# Patient Record
Sex: Male | Born: 1945 | Race: White | Hispanic: No | Smoking: Current every day smoker
Health system: Southern US, Community
[De-identification: ages and names within clinical notes are randomized; demographics above are authoritative.]

## PROBLEM LIST (undated history)

## (undated) DIAGNOSIS — I739 Peripheral vascular disease, unspecified: Secondary | ICD-10-CM

## (undated) DIAGNOSIS — C679 Malignant neoplasm of bladder, unspecified: Secondary | ICD-10-CM

## (undated) DIAGNOSIS — I10 Essential (primary) hypertension: Secondary | ICD-10-CM

## (undated) DIAGNOSIS — C3490 Malignant neoplasm of unspecified part of unspecified bronchus or lung: Secondary | ICD-10-CM

## (undated) HISTORY — DX: Malignant neoplasm of bladder, unspecified: C67.9

## (undated) HISTORY — DX: Essential (primary) hypertension: I10

## (undated) HISTORY — DX: Malignant neoplasm of unspecified part of unspecified bronchus or lung: C34.90

---

## 2001-11-01 HISTORY — PX: PACEMAKER INSERTION: SHX728

## 2007-11-02 HISTORY — PX: IR ABDOMINAL AORTOBIFEMORAL CATHETER SERIALOGRAM: IMG637

## 2007-11-02 HISTORY — PX: OTHER SURGICAL HISTORY: SHX169

## 2011-11-02 HISTORY — PX: COLONOSCOPY: SHX174

## 2014-11-01 DIAGNOSIS — C3490 Malignant neoplasm of unspecified part of unspecified bronchus or lung: Secondary | ICD-10-CM

## 2014-11-01 HISTORY — DX: Malignant neoplasm of unspecified part of unspecified bronchus or lung: C34.90

## 2015-09-02 HISTORY — PX: LOBECTOMY: SHX5089

## 2017-08-12 ENCOUNTER — Other Ambulatory Visit: Payer: Self-pay | Admitting: Hematology and Oncology

## 2017-08-12 HISTORY — PX: CYSTOSCOPY: SUR368

## 2017-08-12 HISTORY — PX: BLADDER SURGERY: SHX569

## 2017-09-07 ENCOUNTER — Encounter: Payer: Self-pay | Admitting: Hematology and Oncology

## 2017-09-07 ENCOUNTER — Inpatient Hospital Stay: Payer: Non-veteran care | Attending: Hematology and Oncology | Admitting: Hematology and Oncology

## 2017-09-07 ENCOUNTER — Inpatient Hospital Stay: Payer: Non-veteran care

## 2017-09-07 VITALS — BP 107/74 | HR 103 | Temp 97.1°F | Resp 18 | Ht 71.0 in | Wt 142.9 lb

## 2017-09-07 DIAGNOSIS — I10 Essential (primary) hypertension: Secondary | ICD-10-CM | POA: Insufficient documentation

## 2017-09-07 DIAGNOSIS — R05 Cough: Secondary | ICD-10-CM | POA: Diagnosis not present

## 2017-09-07 DIAGNOSIS — I739 Peripheral vascular disease, unspecified: Secondary | ICD-10-CM | POA: Insufficient documentation

## 2017-09-07 DIAGNOSIS — I959 Hypotension, unspecified: Secondary | ICD-10-CM | POA: Diagnosis not present

## 2017-09-07 DIAGNOSIS — R509 Fever, unspecified: Secondary | ICD-10-CM | POA: Diagnosis not present

## 2017-09-07 DIAGNOSIS — C679 Malignant neoplasm of bladder, unspecified: Secondary | ICD-10-CM | POA: Diagnosis not present

## 2017-09-07 DIAGNOSIS — C672 Malignant neoplasm of lateral wall of bladder: Secondary | ICD-10-CM

## 2017-09-07 DIAGNOSIS — Z79899 Other long term (current) drug therapy: Secondary | ICD-10-CM | POA: Diagnosis not present

## 2017-09-07 DIAGNOSIS — C7951 Secondary malignant neoplasm of bone: Secondary | ICD-10-CM

## 2017-09-07 DIAGNOSIS — F17210 Nicotine dependence, cigarettes, uncomplicated: Secondary | ICD-10-CM | POA: Insufficient documentation

## 2017-09-07 DIAGNOSIS — I471 Supraventricular tachycardia: Secondary | ICD-10-CM

## 2017-09-07 DIAGNOSIS — R300 Dysuria: Secondary | ICD-10-CM | POA: Insufficient documentation

## 2017-09-07 DIAGNOSIS — M542 Cervicalgia: Secondary | ICD-10-CM | POA: Diagnosis not present

## 2017-09-07 DIAGNOSIS — R001 Bradycardia, unspecified: Secondary | ICD-10-CM

## 2017-09-07 DIAGNOSIS — Z7982 Long term (current) use of aspirin: Secondary | ICD-10-CM | POA: Insufficient documentation

## 2017-09-07 DIAGNOSIS — K5903 Drug induced constipation: Secondary | ICD-10-CM | POA: Insufficient documentation

## 2017-09-07 DIAGNOSIS — R0602 Shortness of breath: Secondary | ICD-10-CM | POA: Diagnosis not present

## 2017-09-07 DIAGNOSIS — Z85118 Personal history of other malignant neoplasm of bronchus and lung: Secondary | ICD-10-CM | POA: Insufficient documentation

## 2017-09-07 DIAGNOSIS — C3411 Malignant neoplasm of upper lobe, right bronchus or lung: Secondary | ICD-10-CM | POA: Insufficient documentation

## 2017-09-07 DIAGNOSIS — T451X5A Adverse effect of antineoplastic and immunosuppressive drugs, initial encounter: Secondary | ICD-10-CM

## 2017-09-07 DIAGNOSIS — R634 Abnormal weight loss: Secondary | ICD-10-CM | POA: Diagnosis not present

## 2017-09-07 DIAGNOSIS — Z7902 Long term (current) use of antithrombotics/antiplatelets: Secondary | ICD-10-CM | POA: Insufficient documentation

## 2017-09-07 DIAGNOSIS — R61 Generalized hyperhidrosis: Secondary | ICD-10-CM | POA: Diagnosis not present

## 2017-09-07 DIAGNOSIS — Z902 Acquired absence of lung [part of]: Secondary | ICD-10-CM

## 2017-09-07 DIAGNOSIS — Z809 Family history of malignant neoplasm, unspecified: Secondary | ICD-10-CM | POA: Insufficient documentation

## 2017-09-07 LAB — URINALYSIS, COMPLETE (UACMP) WITH MICROSCOPIC
Bilirubin Urine: NEGATIVE
GLUCOSE, UA: NEGATIVE mg/dL
Ketones, ur: NEGATIVE mg/dL
NITRITE: NEGATIVE
PH: 5.5 (ref 5.0–8.0)
Protein, ur: 30 mg/dL — AB
SPECIFIC GRAVITY, URINE: 1.025 (ref 1.005–1.030)
Squamous Epithelial / LPF: NONE SEEN

## 2017-09-07 LAB — COMPREHENSIVE METABOLIC PANEL
ALK PHOS: 181 U/L — AB (ref 38–126)
ALT: 17 U/L (ref 17–63)
AST: 22 U/L (ref 15–41)
Albumin: 3.3 g/dL — ABNORMAL LOW (ref 3.5–5.0)
Anion gap: 11 (ref 5–15)
BUN: 9 mg/dL (ref 6–20)
CALCIUM: 8.8 mg/dL — AB (ref 8.9–10.3)
CO2: 23 mmol/L (ref 22–32)
CREATININE: 0.84 mg/dL (ref 0.61–1.24)
Chloride: 97 mmol/L — ABNORMAL LOW (ref 101–111)
Glucose, Bld: 107 mg/dL — ABNORMAL HIGH (ref 65–99)
Potassium: 3.6 mmol/L (ref 3.5–5.1)
Sodium: 131 mmol/L — ABNORMAL LOW (ref 135–145)
Total Bilirubin: 0.3 mg/dL (ref 0.3–1.2)
Total Protein: 7.5 g/dL (ref 6.5–8.1)

## 2017-09-07 LAB — PROTIME-INR
INR: 0.96
PROTHROMBIN TIME: 12.7 s (ref 11.4–15.2)

## 2017-09-07 LAB — CBC WITH DIFFERENTIAL/PLATELET
BASOS PCT: 2 %
Basophils Absolute: 0.2 10*3/uL — ABNORMAL HIGH (ref 0–0.1)
EOS ABS: 0.3 10*3/uL (ref 0–0.7)
EOS PCT: 2 %
HCT: 44.9 % (ref 40.0–52.0)
HEMOGLOBIN: 15.5 g/dL (ref 13.0–18.0)
Lymphocytes Relative: 24 %
Lymphs Abs: 3.2 10*3/uL (ref 1.0–3.6)
MCH: 31 pg (ref 26.0–34.0)
MCHC: 34.5 g/dL (ref 32.0–36.0)
MCV: 89.8 fL (ref 80.0–100.0)
MONOS PCT: 7 %
Monocytes Absolute: 1 10*3/uL (ref 0.2–1.0)
NEUTROS PCT: 65 %
Neutro Abs: 8.5 10*3/uL — ABNORMAL HIGH (ref 1.4–6.5)
PLATELETS: 382 10*3/uL (ref 150–440)
RBC: 5.01 MIL/uL (ref 4.40–5.90)
RDW: 14.2 % (ref 11.5–14.5)
WBC: 13.1 10*3/uL — AB (ref 3.8–10.6)

## 2017-09-07 LAB — APTT: aPTT: 34 seconds (ref 24–36)

## 2017-09-07 NOTE — Progress Notes (Signed)
Patient here today as new evaluation regarding Stage IV bladder cancer.  Referred by [facility] in [place].  Patient accompanied by his girlfriend today.

## 2017-09-07 NOTE — Progress Notes (Signed)
[facility] day:  09/07/2017  Chief Complaint: Patrick Hooper is a 71 y.o. male with stage IV bladder cancer who is referred in consultation by Dr Payton Spark for assessment and management.  HPI:  The patient has a history of stage IB lung cancer status post right upper lobe lobectomy. Patient was diagnosed in 2016 following low dose chest CT screening. He subsequently had a PET scan that demonstrated lung cancer. Lobectomy was done in 09/2015. Pathology revealed a T2aN0M0 adenocarcinoma.  Chest CT on 05/25/2017 revealed postoperative changes from a right upper lobe lobectomy without evidence of recurrence. There was new diffuse punctate sclerotic osseous lesions throughout the chest concerning for metastatic disease.  PET scan on 06/08/2017 revealed right upper lobe lobectomy without evidence of recurrence. There was a new 3.2 cm mass along the right inferior bladder wall, anterior to the ureteral vesicular junction.  There were multiple new focal sclerotic and some probable mixed lytic and sclerotic osseous lesions in the axial skeleton many of which demonstrated increased FDG avidity. Differential diagnoses included include metastatic lung cancer, bladder cancer or prostate cancer. There was increased FDG uptake in the left medial thigh musculature of uncertain etiology. MRI of the left thigh with and without IV contrast was recommended.  He underwent transurethral resection of bladder tumor (TURBT) with bilateral retrograde pyelogram on 08/12/2017 by Dr. Rutherford Limerick.  Delay was secondary to patient being out of town for a 3 week period. He had a 3 cm bladder tumor on the right lateral wall with some sessile and papillary features.  Retrograde pyelograms were normal without filling defect.  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  He presented with symptomatic bone pain (ribs, shoulders, back,  neck, and right elbow).  Pain woke him up at night.  At that time the patient was ambivalent about treatment was more interested in symptom management.   He was interested in palliative radiation. Because of under staffing at the [facility], referral or treatment was made to an outside facility.  He has a history of peripheral vascular disease. He is status post fem-fem bypass in 2016. He is on Plavix.  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  Colonoscopy was normal in 2011.  Symptomatically, he is experiencing urinary difficulties. He has urinary dribbling and burning. Patient is unsure of his prostate health. He notes that he cannot recall the diagnosis of prostatic hyperplasia (BPH) in the past that have produced LUTS. Patient states, "I know they have checked my PSA and they told me that it was low".   Patient was last seen by the urologist at the [facility] on 09/01/2017. Patient has good days and bad days. 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 One day I feel fine, and the next day I feel like hell."  Patient notes fevers (low grade), drenching sweats, and weight loss over the course of the last few weeks. Patient's baseline weight is in the 150s; today he is at 142 pounds. Patient has chronic cough and shortness of breath s/p lung resection. Cough is non-productive. Patient continues to smoke on a daily basis. He has opioid induced constipation. Patient has polyarthralgias.   Patient's activity level is severely affected. He notes that he sits most of the day. He retains the ability to independently perform all of his activities of daily living.  Patient has daily claudication pain. He has  has several vascular procedures to his lower extremities. Patient has stopped his ASA and anticoagulation therapy. Patient states, "I take too much damn medicine, so I stopped it".    Past Medical History:  Diagnosis Date  . Hypertension     Past Surgical  History:  Procedure Laterality Date  . BLADDER SURGERY  08/12/2017  . LOBECTOMY Right 09/2015  . PACEMAKER INSERTION  2003    Family History  Problem Relation Age of Onset  . Cancer Mother   . Cancer Sister   . Cancer Maternal Grandmother     Social History:  reports that he has been smoking cigarettes.  He has a 55.00 pack-year smoking history. he has never used smokeless tobacco. He reports that he drinks alcohol. He reports that he does not use drugs.  He smokes 1 pack a day for > 55 years.  He used to drink 4 alcoholic beverages/day.  Now his alcohol intake is infrequent. 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 There is the potential of past exposure to radiation and/or toxins. He is registered with the [place] regarding his exposures.  He lives in [place].  The patient is accompanied by his girlfriend Optometrist) today.  Allergies:  Allergies  Allergen Reactions  . Cefazolin Rash and Shortness Of Breath  . Carvedilol     Other reaction(s): Unknown  . Chlorthalidone     Other reaction(s): Unknown  . Hydralazine     Other reaction(s): Unknown    Current Medications: Current Outpatient Medications  Medication Sig Dispense Refill  . amLODipine (NORVASC) 10 MG tablet Take 10 mg daily by mouth.    . docusate sodium (COLACE) 100 MG capsule Take 100 mg 2 (two) times daily by mouth.    . mirtazapine (REMERON) 15 MG tablet Take 15 mg at bedtime by mouth.    . morphine (MSIR) 15 MG tablet Take 15 mg every 4 (four) hours as needed by mouth for moderate pain or severe pain.    Marland Kitchen oxycodone (OXY-IR) 5 MG capsule Take 5 mg every 4 (four) hours as needed by mouth for pain.    . traMADol (ULTRAM) 50 MG tablet Take every 6 (six) hours as needed by mouth for moderate pain.    Marland Kitchen aspirin 81 MG chewable tablet Chew 81 mg daily by mouth.    . clopidogrel (PLAVIX) 75 MG tablet Take 75 mg daily by mouth.    Marland Kitchen lisinopril (PRINIVIL,ZESTRIL) 20 MG tablet  Take 20 mg daily by mouth.     No current facility-administered medications for this visit.     Review of Systems:  GENERAL:  Feels "bad".  Low grade fever x 1-2 weeks.  Sweats at night.  Weight loss of 10-15 pounds in the last 1-2 months. PERFORMANCE STATUS (ECOG):  2 HEENT:  Runny nose.  No visual changes, sore throat, mouth sores or tenderness. Lungs: Shortness of breath with exertion.  Cough.  No hemoptysis. Cardiac:  Implanted pacemaker (NOT MRI COMPATIBLE). No chest pain, palpitations, orthopnea, or PND.  Blood pressure fluctuates. GI:  Appetite decreased.  Constipation secondary to narcotics.  No nausea, vomiting, diarrhea, melena or hematochezia. GU:  Dribbling.  Urgency, frequency, dysuria and nocturia.  No hematuria. Musculoskeletal:  Bone pain (lower ribs, shoulders, back, neck, shoulder).  Joint pain (left wrist and right elbow). No muscle tenderness. Extremities:  No pain or swelling. Skin:  No rashes or skin changes. Neuro:  Minor headache.  No numbness or weakness,  balance or coordination issues. Endocrine:  No diabetes, thyroid issues, hot flashes or night sweats. Psych:  No mood changes, depression or anxiety. Pain:  Bone pain. Review of systems:  All other systems reviewed and found to be negative.  Physical Exam: Blood pressure 107/74, pulse (!) 103, temperature (!) 97.1 F (36.2 C), temperature source Tympanic, resp. rate 18, height 5' 11"  (1.803 m), weight 142 lb 13.7 oz (64.8 kg). GENERAL:  Well developed, well nourished, gentleman sitting comfortably in the exam room in no acute distress. MENTAL STATUS:  Alert and oriented to person, place and time. HEAD:  Thin gray hair.  Normocephalic, atraumatic, face symmetric, no Cushingoid features. EYES:  Blue eyes.  Pupils equal round and reactive to light and accomodation.  No conjunctivitis or scleral icterus. ENT:  Oropharynx clear without lesion.  Tongue normal.  Upper dentures.  No lower teeth.  Mucous membranes moist.   NECK:  Well healed scar right sided scar. CHEST:  Left sided pacemaker. RESPIRATORY:  Clear to auscultation without rales, wheezes or rhonchi. CARDIOVASCULAR:  Regular rate and rhythm without murmur, rub or gallop. ABDOMEN:  Soft, non-tender, with active bowel sounds, and no hepatosplenomegaly.  No masses. BACK:  Focal areas of pain in lower thoracic spine and L2-L3. SKIN:  No rashes, ulcers or lesions. EXTREMITIES: Nicotine stained nails.  No edema, no skin discoloration or tenderness.  No palpable cords. LYMPH NODES: No palpable cervical, supraclavicular, axillary or inguinal adenopathy  NEUROLOGICAL: Unremarkable. PSYCH:  Appropriate.   No visits with results within 3 Day(s) from this visit.  Latest known visit with results is:  No results found for any previous visit.    Assessment:  Hermon Zea is a 71 y.o. male with metastatic disease.  He has a recent history of muscle invasive bladder cancer and a distant history of lung cancer.  He has a history of stage IB lung cancer status post right upper lobe lobectomy in 09/2015.  Pathology revealed a T2aN0M0 adenocarcinoma.  Chest CT on 05/25/2017 revealed postoperative changes from a right upper lobe lobectomy without evidence of recurrence. There was new diffuse punctate sclerotic osseous lesions throughout the chest concerning for metastatic disease.  PET scan on 06/08/2017 at the [facility] revealed right upper lobe lobectomy without evidence of recurrence. There was a new 3.2 cm mass along the right inferior bladder wall, anterior to the ureteral vesicular junction.  There were multiple new focal sclerotic and some probable mixed lytic and sclerotic osseous lesions in the axial skeleton many of which demonstrated increased FDG avidity. Differential diagnoses included include metastatic lung cancer, bladder cancer or prostate cancer. There was increased FDG uptake in the left medial thigh musculature of uncertain etiology.  He  underwent transurethral resection of bladder tumor (TURBT) with bilateral retrograde pyelogram on 08/12/2017.  He had a 3 cm bladder tumor on the right lateral wall with some sessile and papillary features.  Retrograde pyelograms were normal without filling defect.  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  He has a history of peripheral vascular disease s/p fem-fem bypass in 2016. He is on Plavix.  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  Colonoscopy was normal in 2011.  Symptomatically, he is fatigued.  He has urinary difficulties. He has low grade fevers, drenching sweats, and weight loss.  He has a chronic cough and shortness of breath s/p lung resection. He continues to smoke on a daily basis. He has opioid induced  constipation.  He has claudication.  Exam reveals tenderness in the lower thoracic spine and L2-L3 area.  Plan: 1.  Discuss metastatic disease.  Patient has a distant history of stage I lung cancer and newly diagnosed muscle invasive bladder cancer.  Discuss obtaining tissue for confirmation of metastatic primary. 2.  Labs today:  CBC with diff, CMP, CEA, PSA, UA, urine culture. 3.  Obtain copy of PET scan performed at [facility]. 4.  Discuss need for repeat imaging to establish new baseline. Schedule repeat PET scan. Will order STAT exam for 09/09/2017. 5.  Discuss treatment for bladder cancer. Discuss platinum based chemotherapy (carboplatin versus cisplatin + gemcitabine).  Patient is not a candidate for bladder resection secondary to metastatic disease.  6.  Discuss plan for PDL-1 testing. Biopsy was done at the [place]. Discuss possibility of immunotherapy based on the results of the testing.  7.  Samara Deist. 8.  Refer to urology to establish local care for urothelial carcinoma.  9.  RTC after PET scan for MD assessment, review of PET imaging, discussion regarding direction of therapy.   Honor Loh, NP   09/07/2017, 3:03 PM   I saw and evaluated the patient, participating in the key portions of the service and reviewing pertinent diagnostic studies and records.  I reviewed the nurse practitioner's note and agree with the findings and the plan.  The assessment and plan were discussed with the patient.  Additional diagnostic studies of a PET scan are needed to clarify the status of his metastatic disease and would change the clinical management. Multiple questions were asked by the patient and answered.   Nolon Stalls, MD 09/07/2017, 3:03 PM

## 2017-09-08 LAB — CEA: CEA: 20.2 ng/mL — ABNORMAL HIGH (ref 0.0–4.7)

## 2017-09-08 LAB — PSA: Prostatic Specific Antigen: 7.95 ng/mL — ABNORMAL HIGH (ref 0.00–4.00)

## 2017-09-09 ENCOUNTER — Ambulatory Visit (INDEPENDENT_AMBULATORY_CARE_PROVIDER_SITE_OTHER): Payer: Medicare Other | Admitting: Urology

## 2017-09-09 ENCOUNTER — Other Ambulatory Visit: Payer: Self-pay | Admitting: Hematology and Oncology

## 2017-09-09 ENCOUNTER — Encounter
Admission: RE | Admit: 2017-09-09 | Discharge: 2017-09-09 | Disposition: A | Discharge status: Home / Self Care | Payer: Non-veteran care | Source: Ambulatory Visit | Attending: Urgent Care | Admitting: Urgent Care

## 2017-09-09 ENCOUNTER — Encounter: Payer: Self-pay | Admitting: Urology

## 2017-09-09 VITALS — BP 109/79 | HR 116 | Ht 71.0 in | Wt 142.0 lb

## 2017-09-09 DIAGNOSIS — C672 Malignant neoplasm of lateral wall of bladder: Secondary | ICD-10-CM | POA: Diagnosis present

## 2017-09-09 DIAGNOSIS — C3411 Malignant neoplasm of upper lobe, right bronchus or lung: Secondary | ICD-10-CM | POA: Insufficient documentation

## 2017-09-09 DIAGNOSIS — C7951 Secondary malignant neoplasm of bone: Secondary | ICD-10-CM | POA: Diagnosis present

## 2017-09-09 DIAGNOSIS — C679 Malignant neoplasm of bladder, unspecified: Secondary | ICD-10-CM | POA: Diagnosis not present

## 2017-09-09 LAB — URINALYSIS, COMPLETE
BILIRUBIN UA: NEGATIVE
Glucose, UA: NEGATIVE
KETONES UA: NEGATIVE
Nitrite, UA: NEGATIVE
PH UA: 7 (ref 5.0–7.5)
SPEC GRAV UA: 1.015 (ref 1.005–1.030)
Urobilinogen, Ur: 1 mg/dL (ref 0.2–1.0)

## 2017-09-09 LAB — URINE CULTURE: Culture: NO GROWTH

## 2017-09-09 LAB — MICROSCOPIC EXAMINATION
BACTERIA UA: NONE SEEN
Epithelial Cells (non renal): NONE SEEN /hpf (ref 0–10)

## 2017-09-09 LAB — GLUCOSE, CAPILLARY: GLUCOSE-CAPILLARY: 112 mg/dL — AB (ref 65–99)

## 2017-09-09 MED ORDER — FLUDEOXYGLUCOSE F - 18 (FDG) INJECTION
12.0100 | Freq: Once | INTRAVENOUS | Status: AC | PRN
  Administered 2017-09-09: 12.01 via INTRAVENOUS

## 2017-09-09 NOTE — Progress Notes (Signed)
09/09/2017 2:53 PM   [place] 12-06-1945 [id number]  Referring provider: Nolon Stalls, M.D.  Chief Complaint  Patient presents with  . New Patient (Initial Visit)    HPI: Patrick Hooper is a 71 year old male referred for evaluation of recent urothelial carcinoma of the bladder.  He has a history of stage Ib lung cancer status post right upper lobe lobectomy for pathologic T2aN0M0 adenocarcinoma.  A chest CT performed in July 2018 revealed new diffuse punctate sclerotic osseous lesions throughout the chest concerning for metastatic disease. PET scan performed in August 2018 revealed a 3 cm mass along the right inferior bladder wall and multiple new focal sclerotic/lytic lesions in the axial skeleton many which demonstrated increased FDG avidity.  TURBT was performed at the [place] on 08/12/2017 with findings of a sessile and papillary right lateral wall tumor.  Pathology revealed high-grade urothelial carcinoma with muscle invasion.  Retrograde pyelograms apparently showed no abnormalities.  [facility] my [facility].  Urinalysis in the [facility] on 11/7 showed pyuria however the urine culture was negative.  He does complain of malaise and back pain.  Repeat PET performed today showed several hypermetabolic mixed lytic/sclerotic osseous metastasis throughout the axial skeleton.  There is suspected intramuscular soft tissue metastasis in the right quadratus lumborum and posterior left lumbar paraspinal musculature.  Mild hypermetabolic retroperitoneal adenopathy was noted.  Diffuse irregular bladder wall thickening prominent on the right was also noted.   PMH: Past Medical History:  Diagnosis Date  . Hypertension   . Lung cancer [facility]) 2016    Surgical History: Past Surgical History:  Procedure Laterality Date  . BLADDER SURGERY  08/12/2017  . BYPASS GRAFT FEMORAL/POPLITEAL W/VEIN  2009  . COLONOSCOPY  2013  . CYSTOSCOPY  08/12/2017  . IR ABDOMINAL  AORTOBIFEMORAL CATHETER SERIALOGRAM  2009  . LOBECTOMY Right 09/2015  . PACEMAKER INSERTION  2003    Home Medications:  Allergies as of 09/09/2017      Reactions   Cefazolin Rash, Shortness Of Breath   Carvedilol    Other reaction(s): Unknown   Chlorthalidone    Other reaction(s): Unknown   Hydralazine    Other reaction(s): Unknown      Medication List        Accurate as of 09/09/17  2:53 PM. Always use your most recent med list.          amLODipine 10 MG tablet Commonly known as:  NORVASC Take 10 mg daily by mouth.   aspirin 81 MG chewable tablet Chew 81 mg daily by mouth.   clopidogrel 75 MG tablet Commonly known as:  PLAVIX Take 75 mg daily by mouth.   docusate sodium 100 MG capsule Commonly known as:  COLACE Take 100 mg 2 (two) times daily by mouth.   lisinopril 20 MG tablet Commonly known as:  PRINIVIL,ZESTRIL Take 20 mg daily by mouth.   mirtazapine 15 MG tablet Commonly known as:  REMERON Take 15 mg at bedtime by mouth.   morphine 15 MG tablet Commonly known as:  MSIR Take 15 mg every 4 (four) hours as needed by mouth for moderate pain or severe pain.   oxycodone 5 MG capsule Commonly known as:  OXY-IR Take 5 mg every 4 (four) hours as needed by mouth for pain.       Allergies:  Allergies  Allergen Reactions  . Cefazolin Rash and Shortness Of Breath  . Carvedilol     Other reaction(s): Unknown  . Chlorthalidone  Other reaction(s): Unknown  . Hydralazine     Other reaction(s): Unknown    Family History: Family History  Problem Relation Age of Onset  . Cancer Mother   . Cancer Sister   . Cancer Maternal Grandmother     Social History:  reports that he has been smoking cigarettes.  He has a 55.00 pack-year smoking history. he has never used smokeless tobacco. He reports that he drinks alcohol. He reports that he does not use drugs.  ROS: UROLOGY Frequent Urination?: Yes Hard to postpone urination?: Yes Burning/pain with  urination?: Yes Get up at night to urinate?: Yes Leakage of urine?: No Urine stream starts and stops?: Yes Trouble starting stream?: No Do you have to strain to urinate?: No Blood in urine?: No Urinary tract infection?: No Sexually transmitted disease?: No Injury to kidneys or bladder?: No Painful intercourse?: No Weak stream?: No Erection problems?: Yes Penile pain?: No  Gastrointestinal Nausea?: No Vomiting?: No Indigestion/heartburn?: No Diarrhea?: No Constipation?: No  Constitutional Fever: Yes Night sweats?: Yes Weight loss?: Yes Fatigue?: Yes  Skin Skin rash/lesions?: No Itching?: No  Eyes Blurred vision?: No Double vision?: No  Ears/Nose/Throat Sore throat?: Yes Sinus problems?: No  Hematologic/Lymphatic Swollen glands?: No Easy bruising?: No  Cardiovascular Leg swelling?: No Chest pain?: No  Respiratory Cough?: Yes Shortness of breath?: Yes  Endocrine Excessive thirst?: No  Musculoskeletal Back pain?: Yes Joint pain?: No  Neurological Headaches?: Yes Dizziness?: Yes  Psychologic Depression?: No Anxiety?: No  Physical Exam: BP 109/79   Pulse (!) 116   Ht 5\' 11"  (1.803 m)   Wt 142 lb (64.4 kg)   BMI 19.80 kg/m   Constitutional:  Alert and oriented, No acute distress. HEENT:  AT, moist mucus membranes.  Trachea midline, no masses. Cardiovascular: No clubbing, cyanosis, or edema. Respiratory: Normal respiratory effort, no increased work of breathing. GI: Abdomen is soft, nontender, nondistended, no abdominal masses GU: No CVA tenderness.  Skin: No rashes, bruises or suspicious lesions. Lymph: No cervical or inguinal adenopathy. Neurologic: Grossly intact, no focal deficits, moving all 4 extremities. Psychiatric: Normal mood and affect.  Laboratory Data: Lab Results  Component Value Date   WBC 13.1 (H) 09/07/2017   HGB 15.5 09/07/2017   HCT 44.9 09/07/2017   MCV 89.8 09/07/2017   PLT 382 09/07/2017    Urinalysis Lab  Results  Component Value Date   APPEARANCEUR CLOUDY (A) 09/07/2017   LEUKOCYTESUR SMALL (A) 09/07/2017   PROTEINUR 30 (A) 09/07/2017   GLUCOSEU NEGATIVE 09/07/2017   RBCU 0-5 09/07/2017   BILIRUBINUR NEGATIVE 09/07/2017   NITRITE NEGATIVE 09/07/2017    Lab Results  Component Value Date   BACTERIA RARE (A) 09/07/2017    Pertinent Imaging:  CLINICAL DATA:  Initial treatment strategy for high-grade muscle invasive urothelial carcinoma of the bladder wall, reportedly excised 08/12/2017. Additional reported history of stage IB right upper lobe lung adenocarcinoma in 2016 status post right upper lobectomy. Reported history of lytic osseous lesions.  EXAM: NUCLEAR MEDICINE PET SKULL BASE TO THIGH  TECHNIQUE: 12.0 mCi F-18 FDG was injected intravenously. Full-ring PET imaging was performed from the skull base to thigh after the radiotracer. CT data was obtained and used for attenuation correction and anatomic localization.  FASTING BLOOD GLUCOSE:  Value: 112 mg/dl  COMPARISON:  None.  FINDINGS: NECK: No hypermetabolic lymph nodes in the neck.  CHEST:  No enlarged or hypermetabolic axillary, mediastinal or hilar lymph nodes.  Status post right upper lobectomy. Ground-glass 1.2 cm anterior left upper  lobe nodular opacity with low level metabolism (max SUV 2.1). Otherwise no acute consolidative airspace disease, lung masses or significant pulmonary nodules. No pneumothorax. No pleural effusions.  Two lead left subclavian pacemaker is noted with lead tips in the right atrium and right ventricular apex. Atherosclerotic nonaneurysmal thoracic aorta.  ABDOMEN/PELVIS:  There are a few mildly hypermetabolic retroperitoneal lymph nodes in the retrocaval/ aortocaval chains, for example a 0.8 cm top-normal size right retrocaval node with max SUV 2.9 (series 3/image 150). Otherwise no pathologically enlarged or hypermetabolic abdominopelvic nodes.  No abnormal  hypermetabolic activity within the liver, pancreas, adrenal glands, or spleen. Probable small hiatal hernia. Cholelithiasis. No overt hydronephrosis. Irregular diffuse bladder wall thickening asymmetrically prominent in the right bladder wall. Mild-to-moderate prostatomegaly. Atherosclerotic abdominal aorta. Status post aortobifemoral surgical bypass. Nonspecific mild hypermetabolism throughout the aortobifemoral bypass graft bilaterally.  SKELETON: There are several hypermetabolic mixed lytic and sclerotic osseous lesions throughout the axial skeleton, with representative osseous lesions as follows:  - predominantly lytic destructive right C4 vertebral/pedicle lesion with max SUV 10.6  - T9 vertebral mixed lytic and sclerotic osseous lesion with max SUV 6.9  - T11 spinous process lytic osseous lesion with max SUV 6.7  - medial left posterior eighth rib lytic expansile osseous lesion with max SUV 10.7  - lytic anterior right seventh costochondral junction lesion with max SUV 6.3  There are suspected soft tissue intramuscular metastases in the inferior right quadratus lumborum muscle with max SUV 8.1 and in the posterior left paraspinal muscles at the L2 level with max SUV 7.1.  IMPRESSION: 1. Several hypermetabolic mixed lytic and sclerotic osseous metastases throughout the axial skeleton. 2. Suspected intramuscular soft tissue metastases in the right quadratus lumborum and posterior left lumbar paraspinal musculature. 3. Mildly hypermetabolic retroperitoneal lymphadenopathy, probably representing nodal metastases. 4. Diffuse irregular bladder wall thickening, asymmetrically prominent in the right bladder wall, suspicious for residual/recurrent bladder neoplasm. No overt hydronephrosis. 5. Nonspecific small ground-glass nodular opacity in the left upper lung lobe with low level metabolism, which could be inflammatory or neoplastic. Recommend attention on follow-up  chest CT in 3 months. 6. No evidence of local tumor recurrence in the right lung status post right upper lobectomy . 7. Chronic findings include: Aortic Atherosclerosis (ICD10-I70.0). Cholelithiasis. Mild-to-moderate prostatomegaly.  Assessment & Plan:   1.  High-grade, muscle invasive urothelial carcinoma Will [facility] for [facility].  Widespread metastatic disease noted on PET although unknown if this is related to metastatic urothelial carcinoma or lung cancer.  He has a follow-up in oncology on Monday.  He obviously would not be a cystectomy candidate if his metastasis was related to urothelial carcinoma.  If metastasis are related to lung cancer feel the risks of cystectomy would outweigh the benefits.  Oncology is considering chemotherapy.    Abbie Sons, [place] [address] [place], [place] [address] [phone]

## 2017-09-11 ENCOUNTER — Encounter: Payer: Self-pay | Admitting: Hematology and Oncology

## 2017-09-12 ENCOUNTER — Other Ambulatory Visit: Payer: Self-pay

## 2017-09-12 ENCOUNTER — Encounter: Payer: Self-pay | Admitting: Hematology and Oncology

## 2017-09-12 ENCOUNTER — Inpatient Hospital Stay (HOSPITAL_BASED_OUTPATIENT_CLINIC_OR_DEPARTMENT_OTHER): Payer: Non-veteran care | Admitting: Hematology and Oncology

## 2017-09-12 ENCOUNTER — Other Ambulatory Visit: Payer: Self-pay | Admitting: *Deleted

## 2017-09-12 VITALS — BP 133/89 | HR 121 | Temp 98.0°F | Wt 140.0 lb

## 2017-09-12 DIAGNOSIS — C679 Malignant neoplasm of bladder, unspecified: Secondary | ICD-10-CM

## 2017-09-12 DIAGNOSIS — C672 Malignant neoplasm of lateral wall of bladder: Secondary | ICD-10-CM

## 2017-09-12 DIAGNOSIS — K5903 Drug induced constipation: Secondary | ICD-10-CM

## 2017-09-12 DIAGNOSIS — R634 Abnormal weight loss: Secondary | ICD-10-CM | POA: Diagnosis not present

## 2017-09-12 DIAGNOSIS — C3411 Malignant neoplasm of upper lobe, right bronchus or lung: Secondary | ICD-10-CM

## 2017-09-12 DIAGNOSIS — Z809 Family history of malignant neoplasm, unspecified: Secondary | ICD-10-CM

## 2017-09-12 DIAGNOSIS — Z85118 Personal history of other malignant neoplasm of bronchus and lung: Secondary | ICD-10-CM

## 2017-09-12 DIAGNOSIS — Z7982 Long term (current) use of aspirin: Secondary | ICD-10-CM

## 2017-09-12 DIAGNOSIS — R0602 Shortness of breath: Secondary | ICD-10-CM

## 2017-09-12 DIAGNOSIS — Z79899 Other long term (current) drug therapy: Secondary | ICD-10-CM

## 2017-09-12 DIAGNOSIS — I471 Supraventricular tachycardia: Secondary | ICD-10-CM

## 2017-09-12 DIAGNOSIS — T451X5A Adverse effect of antineoplastic and immunosuppressive drugs, initial encounter: Secondary | ICD-10-CM

## 2017-09-12 DIAGNOSIS — F17210 Nicotine dependence, cigarettes, uncomplicated: Secondary | ICD-10-CM

## 2017-09-12 DIAGNOSIS — R05 Cough: Secondary | ICD-10-CM | POA: Diagnosis not present

## 2017-09-12 DIAGNOSIS — R000 Tachycardia, unspecified: Secondary | ICD-10-CM

## 2017-09-12 DIAGNOSIS — I739 Peripheral vascular disease, unspecified: Secondary | ICD-10-CM | POA: Diagnosis not present

## 2017-09-12 DIAGNOSIS — I10 Essential (primary) hypertension: Secondary | ICD-10-CM

## 2017-09-12 DIAGNOSIS — C7951 Secondary malignant neoplasm of bone: Secondary | ICD-10-CM

## 2017-09-12 DIAGNOSIS — R61 Generalized hyperhidrosis: Secondary | ICD-10-CM

## 2017-09-12 DIAGNOSIS — Z7189 Other specified counseling: Secondary | ICD-10-CM | POA: Insufficient documentation

## 2017-09-12 DIAGNOSIS — R509 Fever, unspecified: Secondary | ICD-10-CM

## 2017-09-12 DIAGNOSIS — Z7902 Long term (current) use of antithrombotics/antiplatelets: Secondary | ICD-10-CM

## 2017-09-12 DIAGNOSIS — Z902 Acquired absence of lung [part of]: Secondary | ICD-10-CM

## 2017-09-12 DIAGNOSIS — R300 Dysuria: Secondary | ICD-10-CM

## 2017-09-12 NOTE — Progress Notes (Signed)
[facility] day:  09/12/2017  Chief Complaint: Patrick Hooper is a 71 y.o. adult with bone metastasis and a distant history of stage IB lung cancer and a recent diagnosis of muscle invasive bladder cancer who is seen for review of interval PET scan.  HPI:  The patient was last seen in the medical oncology clinic on 09/07/2017 for initial consultation.  At that time, he was fatigued.  He had urinary difficulties. He had low grade fevers, drenching sweats, and weight loss.  He had a chronic cough and shortness of breath.  Exam revealed tenderness in the lower thoracic spine and L2-L3 area.  We discussed repeating his PET scan and obtaining a biopsy to confirm the primary site of his metastatic disease given his history of lung cancer and recent muscle invasive bladder cancer.   Labs revealed a normal CBC and CMP except for an alkaline phosphatase of 181.  Calcium was 8.8 (corrected x.x).  CEA was 20.2.  PSA was 7.95.  PET scan on 09/09/2017 revealed several hypermetabolic mixed lytic and sclerotic osseous metastases throughout the axial skeleton.  There was suspected intramuscular soft tissue metastases in the right quadratus lumborum and posterior left lumbar paraspinal musculature.  There was mildly hypermetabolic retroperitoneal lymphadenopathy, probably representing nodal metastases.  There was diffuse irregular bladder wall thickening, asymmetrically prominent in the right bladder wall, suspicious for residual/recurrent bladder neoplasm.   There was nonspecific small ground-glass nodular opacity in the left upper lung lobe with low level metabolism.  There was no evidence of local tumor recurrence in the right lung status post right upper lobectomy.   Patient has been seen in consult by Dr. Bernardo Heater (urology). Patient advises that urologist told him that his urine was clear and that the prostate was inflamed. Otherwise, there was nothing notable within  the prostate.   During the interim, patient notes that he is the same. He continues to experience fatigue, urinary difficulties, sweats, and weight loss. Patient has lost an additional 2 pounds since his last visit. He has persistent cough and shortness of breath. Patient has pain rated 3/10 today. Pain is in his back and neck.   Patient is TACHYcardic to the 120s. Patient stopped taking his blood pressure medications without medical direction. He notes SBPs in the 90s at home.    Past Medical History:  Diagnosis Date  . Bladder cancer ([place])   . Hypertension   . Lung cancer ([place]) 2016    Past Surgical History:  Procedure Laterality Date  . BLADDER SURGERY  08/12/2017  . BYPASS GRAFT FEMORAL/POPLITEAL W/VEIN  2009  . COLONOSCOPY  2013  . CYSTOSCOPY  08/12/2017  . IR ABDOMINAL AORTOBIFEMORAL CATHETER SERIALOGRAM  2009  . LOBECTOMY Right 09/2015  . PACEMAKER INSERTION  2003    Family History  Problem Relation Age of Onset  . Cancer Mother   . Cancer Sister   . Cancer Maternal Grandmother     Social History:  reports that he has been smoking cigarettes.  He has a 55.00 pack-year smoking history. he has never used smokeless tobacco. He reports that he drinks alcohol. He reports that he does not use drugs.  He smokes 1 pack a day for > 55 years.  He used to drink 4 alcoholic beverages/day.  Now his alcohol intake is infrequent. 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 There is the potential of past exposure to radiation and/or  toxins. He is registered with the [place] regarding his exposures.  He lives in [place].  The patient is accompanied by his girlfriend Optometrist) today.  Allergies:  Allergies  Allergen Reactions  . Cefazolin Rash and Shortness Of Breath  . Carvedilol     Other reaction(s): Unknown  . Chlorthalidone     Other reaction(s): Unknown  . Hydralazine     Other reaction(s): Unknown    Current Medications: Current  Outpatient Medications  Medication Sig Dispense Refill  . aspirin 81 MG chewable tablet Chew 81 mg daily by mouth.    . clopidogrel (PLAVIX) 75 MG tablet Take 75 mg daily by mouth.    . docusate sodium (COLACE) 100 MG capsule Take 100 mg 2 (two) times daily by mouth.    Marland Kitchen ibuprofen (ADVIL,MOTRIN) 200 MG tablet Take 400 mg every 6 (six) hours as needed by mouth.    . mirtazapine (REMERON) 15 MG tablet Take 15 mg at bedtime by mouth.    . morphine (MSIR) 15 MG tablet Take 15 mg every 4 (four) hours as needed by mouth for moderate pain or severe pain.    Marland Kitchen oxycodone (OXY-IR) 5 MG capsule Take 5 mg every 4 (four) hours as needed by mouth for pain.    Marland Kitchen amLODipine (NORVASC) 10 MG tablet Take 10 mg daily by mouth.    Marland Kitchen lisinopril (PRINIVIL,ZESTRIL) 20 MG tablet Take 20 mg daily by mouth.     No current facility-administered medications for this visit.     Review of Systems:  GENERAL:  Fatigue.  Low grade fever x 1-2 weeks.  Sweats at night.  Weight loss of 10-15 pounds in the last 1-2 months.  Two pounds since last visit. PERFORMANCE STATUS (ECOG):  2 HEENT:  No visual changes, sore throat, mouth sores or tenderness. Lungs: Shortness of breath with exertion.  Cough.  No hemoptysis. Cardiac:  Implanted pacemaker (NOT MRI COMPATIBLE). No chest pain, palpitations, orthopnea, or PND.  Blood pressure fluctuates. GI:  Appetite decreased.  Constipation secondary to narcotics.  No nausea, vomiting, diarrhea, melena or hematochezia. GU:  Dribbling.  Urgency, frequency, dysuria and nocturia.  No hematuria. Musculoskeletal:  Bone pain (lower ribs, shoulders, back, neck, shoulder).  Joint pain (left wrist and right elbow). No muscle tenderness. Extremities:  No pain or swelling. Skin:  No rashes or skin changes. Neuro:  Minor headache.  No numbness or weakness, balance or coordination issues. Endocrine:  No diabetes, thyroid issues, hot flashes or night sweats. Psych:  No mood changes, depression or  anxiety. Pain:  Bone pain. Review of systems:  All other systems reviewed and found to be negative.  Physical Exam: Blood pressure 133/89, pulse (!) 121, temperature 98 F (36.7 C), temperature source Tympanic, weight 140 lb (63.5 kg). GENERAL:  Well developed, well nourished, gentleman sitting comfortably in the exam room in no acute distress. MENTAL STATUS:  Alert and oriented to person, place and time. HEAD:  Thin gray hair.  Normocephalic, atraumatic, face symmetric, no Cushingoid features. EYES:  Blue eyes.  No conjunctivitis or scleral icterus. EXTREMITIES: Nicotine stained nails. NEUROLOGICAL: Unremarkable. PSYCH:  Appropriate.   No visits with results within 3 Day(s) from this visit.  Latest known visit with results is:  Office Visit on 09/09/2017  Component Date Value Ref Range Status  . Specific Gravity, UA 09/09/2017 1.015  1.005 - 1.030 Final  . pH, UA 09/09/2017 7.0  5.0 - 7.5 Final  . Color, UA 09/09/2017 Yellow  Yellow Final  .  Appearance Ur 09/09/2017 Cloudy* Clear Final  . Leukocytes, UA 09/09/2017 1+* Negative Final  . Protein, UA 09/09/2017 1+* Negative/Trace Final  . Glucose, UA 09/09/2017 Negative  Negative Final  . Ketones, UA 09/09/2017 Negative  Negative Final  . RBC, UA 09/09/2017 1+* Negative Final  . Bilirubin, UA 09/09/2017 Negative  Negative Final  . Urobilinogen, Ur 09/09/2017 1.0  0.2 - 1.0 mg/dL Final  . Nitrite, UA 09/09/2017 Negative  Negative Final  . Microscopic Examination 09/09/2017 See below:   Final  . WBC, UA 09/09/2017 0-5  0 - 5 /hpf Final  . RBC, UA 09/09/2017 3-10* 0 - 2 /hpf Final  . Epithelial Cells (non renal) 09/09/2017 None seen  0 - 10 /hpf Final  . Bacteria, UA 09/09/2017 None seen  None seen/Few Final    Assessment:  Patrick Hooper is a 71 y.o. adult with metastatic disease.  He has a recent history of muscle invasive bladder cancer and a distant history of lung cancer.  He has a history of stage IB lung cancer status  post right upper lobe lobectomy in 09/2015.  Pathology revealed a T2aN0M0 adenocarcinoma.  Chest CT on 05/25/2017 revealed postoperative changes from a right upper lobe lobectomy without evidence of recurrence. There was new diffuse punctate sclerotic osseous lesions throughout the chest concerning for metastatic disease.  [facility] scan on 06/08/2017 at [facility] revealed right upper lobe lobectomy without evidence of recurrence. There was a new 3.2 cm mass along the right inferior bladder wall, anterior to the ureteral vesicular junction.  There were multiple new focal sclerotic and some probable mixed lytic and sclerotic osseous lesions in the axial skeleton many of which demonstrated increased FDG avidity. Differential diagnoses included include metastatic lung cancer, bladder cancer or prostate cancer. There was increased FDG uptake in the left medial thigh musculature of uncertain etiology.  PET scan on 09/09/2017 revealed several hypermetabolic mixed lytic and sclerotic osseous metastases throughout the axial skeleton.  There was suspected intramuscular soft tissue metastases in the right quadratus lumborum and posterior left lumbar paraspinal musculature.  There was mildly hypermetabolic retroperitoneal lymphadenopathy, probably representing nodal metastases.  There was diffuse irregular bladder wall thickening, asymmetrically prominent in the right bladder wall, suspicious for residual/recurrent bladder neoplasm.   There was nonspecific small ground-glass nodular opacity in the left upper lung lobe with low level metabolism.  There was no evidence of local tumor recurrence in the right lung status post right upper lobectomy   He underwent transurethral resection of bladder tumor (TURBT) with bilateral retrograde pyelogram on 08/12/2017.  He had a 3 cm bladder tumor on the right lateral wall with some sessile and papillary features.  Retrograde pyelograms were normal without filling defect.   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  He has a history of peripheral vascular disease s/p fem-fem bypass in 2016. He is on Plavix.  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  Colonoscopy was normal in 2011.  Symptomatically, he is fatigued.  He has urinary difficulties. He has low grade fevers, drenching sweats, and weight loss.  He has a chronic cough and shortness of breath s/p lung resection. He continues to smoke on a daily basis. He has opioid induced constipation.  He has claudication.  Exam reveals tenderness in the lower thoracic spine and L2-L3 area.  Plan: 1.  Review interval PET scan.  Images reviewed with patient.  Discuss metastatic disease.  Patient has a  distant history of stage I lung cancer and newly diagnosed muscle invasive bladder cancer.  Discuss obtaining tissue for confirmation of metastatic primary. The biopsy has been ordered, however it has not been scheduled at this point.  2.  Discuss treatment for bladder cancer. Discuss platinum based chemotherapy (carboplatin versus cisplatin + gemcitabine).  Patient is not a candidate for bladder resection secondary to metastatic disease.  Side effects of medications reviewed. Discuss need for port-a-cath placement and chemotherapy education class. In the absence of treatment, patient has an approximate 6 month prognosis.   3.  Discuss consult with radiation oncology. Referral entered.  4.  Discuss Xgeva.  Patient is edentulous, therefore he will not require dental clearance. We have asked for this medication to be preauthorized.  5.  Discuss plan for PDL-1 testing. Biopsy was done at the [place]. Discuss possibility of immunotherapy based on the results of the testing.  6.  Discuss TACHYcardia. Patient with a HR in the 120s. Discussed likely stress response. EKG done that showed NSR without ST segment elevation or depression at a rate of 92. Heart rate improved  significantly when patient relaxed in the supine position.   7.  Discuss pain control. Patient was put on oxycodone 14m q4 PRN and MS Contin BID, however he has not been taking them because of how they were making him feel.. Discuss keeping a pain diary.   8.  RTC for labs (BMP) and Xgeva when he comes in for radiation oncology appointment.  9.  RTC after biopsy for MD assessment and discussion regarding direction of therapy.   BHonor Loh NP  09/12/2017, 3:53 PM   I saw and evaluated the patient, participating in the key portions of the service and reviewing pertinent diagnostic studies and records.  I reviewed the nurse practitioner's note and agree with the findings and the plan.  The assessment and plan were discussed with the patient.  Multiple questions were asked by the patient and answered.   MNolon Stalls MD 09/12/2017, 3:53 PM

## 2017-09-12 NOTE — Progress Notes (Signed)
Patient is here to follow up with PET Scan results today, and to discuss treatment. He has intermittent pain in his neck, back and ribs. He states that he has good days and bad days. He has stopped his blood pressure medications because he thinks his BP has been running to low. His pulse is elevated today and he states that he is not feeling well today. He states that he has been taking ibuprofen for aches and pains also.

## 2017-09-13 ENCOUNTER — Telehealth: Payer: Self-pay | Admitting: *Deleted

## 2017-09-13 LAB — CULTURE, URINE COMPREHENSIVE

## 2017-09-13 NOTE — Telephone Encounter (Signed)
Called and spoke to Patrick Hooper, patient's significant other to let her know that the patient will receive a Xgeva injection on Friday @ 11:30 after seeing Dr. Donella Stade.  Also informed them that no chemo class is needed for Xgeva.

## 2017-09-14 ENCOUNTER — Telehealth: Payer: Self-pay | Admitting: Family Medicine

## 2017-09-14 DIAGNOSIS — C679 Malignant neoplasm of bladder, unspecified: Secondary | ICD-10-CM | POA: Insufficient documentation

## 2017-09-14 NOTE — Telephone Encounter (Signed)
Patient notified

## 2017-09-14 NOTE — Telephone Encounter (Signed)
-----   Message from Abbie Sons, MD sent at 09/14/2017  7:06 AM EST ----- Urine culture showed no evidence of infection

## 2017-09-15 ENCOUNTER — Telehealth: Payer: Self-pay | Admitting: *Deleted

## 2017-09-15 NOTE — Telephone Encounter (Signed)
Called patient's sister and obtained cell number for patient [phone]). Called Pat, significant other and gave her the following information.  BMB is scheduled for Wednesday, November 21st.  Patient to arrive at [facility] @ 10:30 for 11:30 procedure.  Patient instructed to take his Plavix today but needs to hold it after today.  He has to be off if it for 5 days.  Pat verbalized understanding.

## 2017-09-16 ENCOUNTER — Other Ambulatory Visit: Payer: Self-pay | Admitting: Urgent Care

## 2017-09-16 ENCOUNTER — Encounter: Payer: Self-pay | Admitting: Radiation Oncology

## 2017-09-16 ENCOUNTER — Ambulatory Visit
Admission: RE | Admit: 2017-09-16 | Discharge: 2017-09-16 | Disposition: A | Discharge status: Home / Self Care | Payer: Non-veteran care | Source: Ambulatory Visit | Attending: Radiation Oncology | Admitting: Radiation Oncology

## 2017-09-16 ENCOUNTER — Inpatient Hospital Stay: Payer: Non-veteran care

## 2017-09-16 ENCOUNTER — Other Ambulatory Visit: Payer: Self-pay | Admitting: Hematology and Oncology

## 2017-09-16 ENCOUNTER — Other Ambulatory Visit: Payer: Self-pay

## 2017-09-16 VITALS — BP 118/85 | HR 93 | Temp 98.0°F | Wt 139.4 lb

## 2017-09-16 DIAGNOSIS — R509 Fever, unspecified: Secondary | ICD-10-CM | POA: Insufficient documentation

## 2017-09-16 DIAGNOSIS — C3411 Malignant neoplasm of upper lobe, right bronchus or lung: Secondary | ICD-10-CM | POA: Insufficient documentation

## 2017-09-16 DIAGNOSIS — R630 Anorexia: Secondary | ICD-10-CM | POA: Insufficient documentation

## 2017-09-16 DIAGNOSIS — C7951 Secondary malignant neoplasm of bone: Secondary | ICD-10-CM

## 2017-09-16 DIAGNOSIS — Z7982 Long term (current) use of aspirin: Secondary | ICD-10-CM | POA: Insufficient documentation

## 2017-09-16 DIAGNOSIS — I10 Essential (primary) hypertension: Secondary | ICD-10-CM | POA: Insufficient documentation

## 2017-09-16 DIAGNOSIS — C679 Malignant neoplasm of bladder, unspecified: Secondary | ICD-10-CM

## 2017-09-16 DIAGNOSIS — M542 Cervicalgia: Secondary | ICD-10-CM | POA: Insufficient documentation

## 2017-09-16 DIAGNOSIS — R5383 Other fatigue: Secondary | ICD-10-CM | POA: Insufficient documentation

## 2017-09-16 DIAGNOSIS — Z79899 Other long term (current) drug therapy: Secondary | ICD-10-CM | POA: Insufficient documentation

## 2017-09-16 DIAGNOSIS — Z809 Family history of malignant neoplasm, unspecified: Secondary | ICD-10-CM | POA: Insufficient documentation

## 2017-09-16 DIAGNOSIS — C672 Malignant neoplasm of lateral wall of bladder: Secondary | ICD-10-CM

## 2017-09-16 DIAGNOSIS — Z510 Encounter for antineoplastic radiation therapy: Secondary | ICD-10-CM | POA: Insufficient documentation

## 2017-09-16 DIAGNOSIS — F17210 Nicotine dependence, cigarettes, uncomplicated: Secondary | ICD-10-CM | POA: Insufficient documentation

## 2017-09-16 LAB — BASIC METABOLIC PANEL
Anion gap: 8 (ref 5–15)
BUN: 13 mg/dL (ref 6–20)
CO2: 25 mmol/L (ref 22–32)
Calcium: 8.4 mg/dL — ABNORMAL LOW (ref 8.9–10.3)
Chloride: 98 mmol/L — ABNORMAL LOW (ref 101–111)
Creatinine, Ser: 1.01 mg/dL (ref 0.61–1.24)
GFR calc Af Amer: >60 mL/min (ref >60)
GFR calc non Af Amer: >60 mL/min (ref >60)
Glucose, Bld: 112 mg/dL — ABNORMAL HIGH (ref 65–99)
Potassium: 3.6 mmol/L (ref 3.5–5.1)
Sodium: 131 mmol/L — ABNORMAL LOW (ref 135–145)

## 2017-09-16 MED ORDER — DENOSUMAB 120 MG/1.7ML ~~LOC~~ SOLN
120.0000 mg | Freq: Once | SUBCUTANEOUS | Status: AC
  Administered 2017-09-16: 120 mg via SUBCUTANEOUS
  Filled 2017-09-16: qty 1.7

## 2017-09-16 NOTE — Progress Notes (Signed)
Ca=8.4, Alb =3.3, Corrected calcium =8.96

## 2017-09-16 NOTE — Consult Note (Signed)
NEW PATIENT EVALUATION  Name: Patrick Hooper  MRN: [id number]  Date:   09/16/2017     DOB: August 23, 1946   This 71 y.o. adult patient presents to the clinic for initial evaluation of metastatic disease to his cervical thoracic spine from probable stage IV lung cancer.  REFERRING PHYSICIAN: [facility] [place] COMPLAINT:  Chief Complaint  Patient presents with  . Cancer    Initial Evaluation    DIAGNOSIS: The encounter diagnosis was Bone metastases ([place]).   PREVIOUS INVESTIGATIONS:  PET CT scan is reviewed Clinical notes reviewed Pathology pending  HPI: Patient is a 71 year old male who is status post stage IB lung cancer status post surgical resection at the during [facility]. Patient has presented with increasing fatigue weight loss fever and chills. He also had a chronic cough no hemoptysis. He's also been presenting with increasing neck pain as well as mid back pain. PET scan was performed showing hypermetabolic activity in the C4 vertebral body as well as in the region of T9 and T10 consistent with metastatic disease. He also subcutaneous hypermetabolic nodules which one of which is scheduled for biopsy next week. He also had diffuse irregular bladder wall thickening suspicious for bladder cancer. In fact he did have a TURBT performed at the [facility] in October showing high-grade urothelial carcinoma with muscle invasion. The biopsy of his subcutaneous nodules will determine whether we are dealing with stage IV bladder cancer or lung cancer. He is seen today for consideration of palliative treatment to his spine in the areas as described above with considerable pain.  PLANNED TREATMENT REGIMEN: Palliative ration therapy to cervical and thoracic spine  PAST MEDICAL HISTORY:  has a past medical history of Bladder cancer ([place]), Hypertension, and Lung cancer ([place]) (2016).    PAST SURGICAL HISTORY:  Past Surgical History:  Procedure Laterality Date  . BLADDER  SURGERY  08/12/2017  . BYPASS GRAFT FEMORAL/POPLITEAL W/VEIN  2009  . COLONOSCOPY  2013  . CYSTOSCOPY  08/12/2017  . IR ABDOMINAL AORTOBIFEMORAL CATHETER SERIALOGRAM  2009  . LOBECTOMY Right 09/2015  . PACEMAKER INSERTION  2003    FAMILY HISTORY: family history includes Cancer in his maternal grandmother, mother, and sister.  SOCIAL HISTORY:  reports that he has been smoking cigarettes.  He has a 55.00 pack-year smoking history. he has never used smokeless tobacco. He reports that he drinks alcohol. He reports that he does not use drugs.  ALLERGIES: Cefazolin; Carvedilol; Chlorthalidone; and Hydralazine  MEDICATIONS:  Current Outpatient Medications  Medication Sig Dispense Refill  . amLODipine (NORVASC) 10 MG tablet Take 10 mg daily by mouth.    Marland Kitchen aspirin 81 MG chewable tablet Chew 81 mg daily by mouth.    . clopidogrel (PLAVIX) 75 MG tablet Take 75 mg daily by mouth.    . docusate sodium (COLACE) 100 MG capsule Take 100 mg 2 (two) times daily by mouth.    Marland Kitchen ibuprofen (ADVIL,MOTRIN) 200 MG tablet Take 400 mg every 6 (six) hours as needed by mouth.    Marland Kitchen lisinopril (PRINIVIL,ZESTRIL) 20 MG tablet Take 20 mg daily by mouth.    . mirtazapine (REMERON) 15 MG tablet Take 15 mg at bedtime by mouth.    . morphine (MSIR) 15 MG tablet Take 15 mg every 4 (four) hours as needed by mouth for moderate pain or severe pain.    Marland Kitchen oxycodone (OXY-IR) 5 MG capsule Take 5 mg every 4 (four) hours as needed by mouth for pain.  No current facility-administered medications for this encounter.     ECOG PERFORMANCE STATUS:  1 - Symptomatic but completely ambulatory  REVIEW OF SYSTEMS: Except for the bone pain feeling quite fatigued and upper stated symptoms Patient denies any weight loss, fatigue, weakness, fever, chills or night sweats. Patient denies any loss of vision, blurred vision. Patient denies any ringing  of the ears or hearing loss. No irregular heartbeat. Patient denies heart murmur or history  of fainting. Patient denies any chest pain or pain radiating to her upper extremities. Patient denies any shortness of breath, difficulty breathing at night, cough or hemoptysis. Patient denies any swelling in the lower legs. Patient denies any nausea vomiting, vomiting of blood, or coffee ground material in the vomitus. Patient denies any stomach pain. Patient states has had normal bowel movements no significant constipation or diarrhea. Patient denies any dysuria, hematuria or significant nocturia. Patient denies any problems walking, swelling in the joints or loss of balance. Patient denies any skin changes, loss of hair or loss of weight. Patient denies any excessive worrying or anxiety or significant depression. Patient denies any problems with insomnia. Patient denies excessive thirst, polyuria, polydipsia. Patient denies any swollen glands, patient denies easy bruising or easy bleeding. Patient denies any recent infections, allergies or URI. Patient "s visual fields have not changed significantly in recent time.    PHYSICAL EXAM: BP 118/85   Pulse 93   Temp 98 F (36.7 C)   Wt 139 lb 7.1 oz (63.3 kg)   BMI 19.45 kg/m  Well-developed thin slightly cachectic male in NAD. Range of motion of his neck doesn't elicit some pain deep palpation of his lower third thoracic spine also elicits pain. Motor sensory and DTR levels are equal and symmetric in the upper lower extremities. Well-developed well-nourished patient in NAD. HEENT reveals PERLA, EOMI, discs not visualized.  Oral cavity is clear. No oral mucosal lesions are identified. Neck is clear without evidence of cervical or supraclavicular adenopathy. Lungs are clear to A&P. Cardiac examination is essentially unremarkable with regular rate and rhythm without murmur rub or thrill. Abdomen is benign with no organomegaly or masses noted. Motor sensory and DTR levels are equal and symmetric in the upper and lower extremities. Cranial nerves II through  XII are grossly intact. Proprioception is intact. No peripheral adenopathy or edema is identified. No motor or sensory levels are noted. Crude visual fields are within normal range.  LABORATORY DATA: Will review pathology when available    RADIOLOGY RESULTS: PET CT scan is reviewed and compatible with the above-stated findings   IMPRESSION: Stage IV cancer most likely of lung primary in 71 year old male biopsy to rule out bladder versus lung primary.  PLAN: At this time like to start palliative radiation therapy to his cervical and thoracic spine. Would treat both areas simultaneously up to 3000 cGy in 10 fractions. Risks and benefits of treatment including possible sore throat with dysphasia as well as possible radiation esophagitis skin reaction fatigue alteration of blood counts all were discussed in detail with the patient. I have personally set up and ordered CT simulation after his biopsy was performed next week. Should he be determined to go ahead with systemic chemotherapy will coordinate with that with medical oncology. Patient and his wife both seem to comprehend my treatment plan well.  I would like to take this opportunity to thank you for allowing me to participate in the care of your patient.Armstead Peaks., MD

## 2017-09-20 ENCOUNTER — Other Ambulatory Visit: Payer: Self-pay | Admitting: Radiology

## 2017-09-21 ENCOUNTER — Ambulatory Visit
Admission: RE | Admit: 2017-09-21 | Discharge: 2017-09-21 | Disposition: A | Discharge status: Home / Self Care | Payer: Non-veteran care | Source: Ambulatory Visit | Attending: Radiation Oncology | Admitting: Radiation Oncology

## 2017-09-21 ENCOUNTER — Other Ambulatory Visit (HOSPITAL_COMMUNITY): Payer: Self-pay | Admitting: Interventional Radiology

## 2017-09-21 ENCOUNTER — Ambulatory Visit
Admission: RE | Admit: 2017-09-21 | Discharge: 2017-09-21 | Disposition: A | Discharge status: Home / Self Care | Payer: Non-veteran care | Source: Ambulatory Visit | Attending: Hematology and Oncology | Admitting: Hematology and Oncology

## 2017-09-21 DIAGNOSIS — Z7982 Long term (current) use of aspirin: Secondary | ICD-10-CM | POA: Insufficient documentation

## 2017-09-21 DIAGNOSIS — F17210 Nicotine dependence, cigarettes, uncomplicated: Secondary | ICD-10-CM | POA: Insufficient documentation

## 2017-09-21 DIAGNOSIS — Z950 Presence of cardiac pacemaker: Secondary | ICD-10-CM | POA: Diagnosis not present

## 2017-09-21 DIAGNOSIS — N400 Enlarged prostate without lower urinary tract symptoms: Secondary | ICD-10-CM | POA: Diagnosis not present

## 2017-09-21 DIAGNOSIS — Z902 Acquired absence of lung [part of]: Secondary | ICD-10-CM | POA: Insufficient documentation

## 2017-09-21 DIAGNOSIS — Z79899 Other long term (current) drug therapy: Secondary | ICD-10-CM | POA: Insufficient documentation

## 2017-09-21 DIAGNOSIS — K8020 Calculus of gallbladder without cholecystitis without obstruction: Secondary | ICD-10-CM | POA: Diagnosis not present

## 2017-09-21 DIAGNOSIS — C7989 Secondary malignant neoplasm of other specified sites: Secondary | ICD-10-CM | POA: Insufficient documentation

## 2017-09-21 DIAGNOSIS — I700 Atherosclerosis of aorta: Secondary | ICD-10-CM | POA: Diagnosis not present

## 2017-09-21 DIAGNOSIS — C672 Malignant neoplasm of lateral wall of bladder: Secondary | ICD-10-CM

## 2017-09-21 DIAGNOSIS — C679 Malignant neoplasm of bladder, unspecified: Secondary | ICD-10-CM | POA: Insufficient documentation

## 2017-09-21 DIAGNOSIS — Z85118 Personal history of other malignant neoplasm of bronchus and lung: Secondary | ICD-10-CM | POA: Diagnosis not present

## 2017-09-21 DIAGNOSIS — I10 Essential (primary) hypertension: Secondary | ICD-10-CM | POA: Insufficient documentation

## 2017-09-21 LAB — CBC WITH DIFFERENTIAL/PLATELET
BASOS ABS: 0.1 10*3/uL (ref 0–0.1)
BASOS PCT: 1 %
EOS ABS: 0.2 10*3/uL (ref 0–0.7)
Eosinophils Relative: 2 %
HEMATOCRIT: 45.4 % (ref 40.0–52.0)
HEMOGLOBIN: 15.2 g/dL (ref 13.0–18.0)
Lymphocytes Relative: 19 %
Lymphs Abs: 2 10*3/uL (ref 1.0–3.6)
MCH: 29.6 pg (ref 26.0–34.0)
MCHC: 33.6 g/dL (ref 32.0–36.0)
MCV: 88.1 fL (ref 80.0–100.0)
MONOS PCT: 7 %
Monocytes Absolute: 0.7 10*3/uL (ref 0.2–1.0)
NEUTROS ABS: 7.9 10*3/uL — AB (ref 1.4–6.5)
NEUTROS PCT: 73 %
Platelets: 545 10*3/uL — ABNORMAL HIGH (ref 150–440)
RBC: 5.15 MIL/uL (ref 4.40–5.90)
RDW: 14 % (ref 11.5–14.5)
WBC: 10.9 10*3/uL — AB (ref 3.8–10.6)

## 2017-09-21 LAB — PATHOLOGY

## 2017-09-21 LAB — PROTIME-INR
INR: 0.96
PROTHROMBIN TIME: 12.7 s (ref 11.4–15.2)

## 2017-09-21 MED ORDER — LIDOCAINE HCL (PF) 1 % IJ SOLN
INTRAMUSCULAR | Status: AC | PRN
  Administered 2017-09-21: 1 mL

## 2017-09-21 MED ORDER — MIDAZOLAM HCL 5 MG/5ML IJ SOLN
INTRAMUSCULAR | Status: AC | PRN
  Administered 2017-09-21 (×2): 1 mg via INTRAVENOUS

## 2017-09-21 MED ORDER — FENTANYL CITRATE (PF) 100 MCG/2ML IJ SOLN
INTRAMUSCULAR | Status: AC
  Filled 2017-09-21: qty 4

## 2017-09-21 MED ORDER — SODIUM CHLORIDE 0.9 % IV SOLN
INTRAVENOUS | Status: DC
  Administered 2017-09-21: 11:00:00 via INTRAVENOUS

## 2017-09-21 MED ORDER — FENTANYL CITRATE (PF) 100 MCG/2ML IJ SOLN
INTRAMUSCULAR | Status: AC | PRN
  Administered 2017-09-21: 50 ug via INTRAVENOUS

## 2017-09-21 MED ORDER — MIDAZOLAM HCL 5 MG/5ML IJ SOLN
INTRAMUSCULAR | Status: AC
  Filled 2017-09-21: qty 5

## 2017-09-21 NOTE — Procedures (Signed)
Back muscle mass Bx 18 g times three EBL 0 Comp 0

## 2017-09-21 NOTE — H&P (Signed)
Chief Complaint: Patient was seen in consultation today for No chief complaint on file.  at the request of [place] [facility]  Referring Physician(s): [place] C  Supervising Physician: Marybelle Killings  Patient Status: [facility]  History of Present Illness: Patrick Hooper is a 71 y.o. adult with a history of lung and bladder cancer, now with widespread lesions, including a back muscle lesion.  Past Medical History:  Diagnosis Date  . Bladder cancer ([place])   . Hypertension   . Lung cancer ([place]) 2016    Past Surgical History:  Procedure Laterality Date  . BLADDER SURGERY  08/12/2017  . BYPASS GRAFT FEMORAL/POPLITEAL W/VEIN  2009  . COLONOSCOPY  2013  . CYSTOSCOPY  08/12/2017  . IR ABDOMINAL AORTOBIFEMORAL CATHETER SERIALOGRAM  2009  . LOBECTOMY Right 09/2015  . PACEMAKER INSERTION  2003    Allergies: Cefazolin; Carvedilol; Chlorthalidone; and Hydralazine  Medications: Prior to Admission medications   Medication Sig Start Date End Date Taking? Authorizing Provider  amLODipine (NORVASC) 10 MG tablet Take 10 mg daily by mouth.   Yes [provider]  clopidogrel (PLAVIX) 75 MG tablet Take 75 mg daily by mouth.   Yes [provider]  docusate sodium (COLACE) 100 MG capsule Take 100 mg 2 (two) times daily by mouth.   Yes [provider]  ibuprofen (ADVIL,MOTRIN) 200 MG tablet Take 400 mg every 6 (six) hours as needed by mouth.   Yes [provider]  lisinopril (PRINIVIL,ZESTRIL) 20 MG tablet Take 20 mg daily by mouth.   Yes [provider]  mirtazapine (REMERON) 15 MG tablet Take 15 mg at bedtime by mouth.   Yes [provider]  oxycodone (OXY-IR) 5 MG capsule Take 5 mg every 4 (four) hours as needed by mouth for pain.   Yes [provider]  aspirin 81 MG chewable tablet Chew 81 mg daily by mouth.    [provider]  morphine (MSIR) 15 MG tablet Take 15 mg every 4 (four) hours as needed by  mouth for moderate pain or severe pain.    [provider]     Family History  Problem Relation Age of Onset  . Cancer Mother   . Cancer Sister   . Cancer Maternal Grandmother     Social History   Socioeconomic History  . Marital status: Significant Other    Spouse name: None  . Number of children: None  . Years of education: None  . Highest education level: None  Social Needs  . Financial resource strain: None  . Food insecurity - worry: None  . Food insecurity - inability: None  . Transportation needs - medical: None  . Transportation needs - non-medical: None  Occupational History  . None  Tobacco Use  . Smoking status: Current Every Day Smoker    Packs/day: 1.00    Years: 55.00    Pack years: 55.00    Types: Cigarettes  . Smokeless tobacco: Never Used  Substance and Sexual Activity  . Alcohol use: Yes    Comment: Occasional beer  . Drug use: No  . Sexual activity: No  Other Topics Concern  . None  Social History Narrative  . None     Review of Systems: A 12 point ROS discussed and pertinent positives are indicated in the HPI above.  All other systems are negative.  Review of Systems  Vital Signs: BP 137/77   Pulse (!) 101   Temp 97.6 F (36.4 C) (Oral)  Resp (!) 22   Ht 5\' 11"  (1.803 m)   Wt 140 lb (63.5 kg)   SpO2 98%   BMI 19.53 kg/m   Physical Exam  Constitutional: He is oriented to person, place, and time. He appears well-developed and well-nourished.  Cardiovascular: Normal rate and regular rhythm.  Pulmonary/Chest: Effort normal and breath sounds normal.  Neurological: He is alert and oriented to person, place, and time.    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 Additional reported history of stage IB right upper lobe lung adenocarcinoma in  2016 status post right upper lobectomy. Reported history of lytic osseous lesions. EXAM: NUCLEAR MEDICINE PET SKULL BASE TO THIGH TECHNIQUE: 12.0 mCi F-18 FDG was injected intravenously. Full-ring PET imaging was performed from the skull base to thigh after the radiotracer. CT data was obtained and used for attenuation correction and anatomic localization. FASTING BLOOD GLUCOSE:  Value: 112 mg/dl COMPARISON:  None. FINDINGS: NECK: No hypermetabolic lymph nodes in the neck. CHEST: No enlarged or hypermetabolic axillary, mediastinal or hilar lymph nodes. Status post right upper lobectomy. Ground-glass 1.2 cm anterior left upper lobe nodular opacity with low level metabolism (max SUV 2.1). Otherwise no acute consolidative airspace disease, lung masses or significant pulmonary nodules. No pneumothorax. No pleural effusions. Two lead left subclavian pacemaker is noted with lead tips in the right atrium and right ventricular apex. Atherosclerotic nonaneurysmal thoracic aorta. ABDOMEN/PELVIS: There are a few mildly hypermetabolic retroperitoneal lymph nodes in the retrocaval/ aortocaval chains, for example a 0.8 cm top-normal size right retrocaval node with max SUV 2.9 (series 3/image 150). Otherwise no pathologically enlarged or hypermetabolic abdominopelvic nodes. No abnormal hypermetabolic activity within the liver, pancreas, adrenal glands, or spleen. Probable small hiatal hernia. Cholelithiasis. No overt hydronephrosis. Irregular diffuse bladder wall thickening asymmetrically prominent in the right bladder wall. Mild-to-moderate prostatomegaly. Atherosclerotic abdominal aorta. Status post aortobifemoral surgical bypass. Nonspecific mild hypermetabolism throughout the aortobifemoral bypass graft bilaterally. SKELETON: There are several hypermetabolic mixed lytic and sclerotic osseous lesions throughout the axial skeleton, with representative osseous lesions as follows: - predominantly lytic destructive right C4  vertebral/pedicle lesion with max SUV 10.6 - T9 vertebral mixed lytic and sclerotic osseous lesion with max SUV 6.9 - T11 spinous process lytic osseous lesion with max SUV 6.7 - medial left posterior eighth rib lytic expansile osseous lesion with max SUV 10.7 - lytic anterior right seventh costochondral junction lesion with max SUV 6.3 There are suspected soft tissue intramuscular metastases in the inferior right quadratus lumborum muscle with max SUV 8.1 and in the posterior left paraspinal muscles at the L2 level with max SUV 7.1. IMPRESSION: 1. Several hypermetabolic mixed lytic and sclerotic osseous metastases throughout the axial skeleton. 2. Suspected intramuscular soft tissue metastases in the right quadratus lumborum and posterior left lumbar paraspinal musculature. 3. Mildly hypermetabolic retroperitoneal lymphadenopathy, probably representing nodal metastases. 4. Diffuse irregular bladder wall thickening, asymmetrically prominent in the right bladder wall, suspicious for residual/recurrent bladder neoplasm. No overt hydronephrosis. 5. Nonspecific small ground-glass nodular opacity in the left upper lung lobe with low level metabolism, which could be inflammatory or neoplastic. Recommend attention on follow-up chest CT in 3 months. 6. No evidence of local tumor recurrence in the right lung status post right upper lobectomy . 7. Chronic findings include: Aortic Atherosclerosis (ICD10-I70.0). Cholelithiasis. Mild-to-moderate prostatomegaly. Electronically Signed   By: Ilona Sorrel  M.D.   On: 09/09/2017 10:59    Labs:  CBC: Recent Labs    09/07/17 1538 09/21/17 1057  WBC 13.1* 10.9*  HGB 15.5 15.2  HCT 44.9 45.4  PLT 382 545*    COAGS: Recent Labs    09/07/17 1538  INR 0.96  APTT 34    BMP: Recent Labs    09/07/17 1538 09/16/17 1017  NA 131* 131*  K 3.6 3.6  CL 97* 98*  CO2 23 25  GLUCOSE 107* 112*  BUN 9 13  CALCIUM 8.8* 8.4*  CREATININE 0.84 1.01  GFRNONAA >60 >60  GFRAA  >60 >60    LIVER FUNCTION TESTS: Recent Labs    09/07/17 1538  BILITOT 0.3  AST 22  ALT 17  ALKPHOS 181*  PROT 7.5  ALBUMIN 3.3*    TUMOR MARKERS: No results for input(s): AFPTM, CEA, CA199, CHROMGRNA in the last 8760 hours.  Assessment and Plan:  Widespread probable metastatic disease. Back muscle lesion biopsy to follow.  Thank you for this interesting consult.  I greatly enjoyed meeting Burleigh Brockmann and look forward to participating in their care.  A copy of this report was sent to the requesting provider on this date.  Electronically Signed: Saavi Mceachron, ART A, MD 09/21/2017, 11:44 AM   I spent a total of  40 Minutes   in face to face in clinical consultation, greater than 50% of which was counseling/coordinating care for CT muscle biopsy.

## 2017-09-21 NOTE — Progress Notes (Signed)
Pt sitting up in bed eating and drinking w/o difficulty in NAD, wife at bedside.  Discharge and follow up instructions reviewed w/ pt and family who verbalize understanding.

## 2017-09-26 ENCOUNTER — Other Ambulatory Visit: Payer: Self-pay | Admitting: *Deleted

## 2017-09-26 LAB — SURGICAL PATHOLOGY

## 2017-09-27 ENCOUNTER — Inpatient Hospital Stay (HOSPITAL_BASED_OUTPATIENT_CLINIC_OR_DEPARTMENT_OTHER): Payer: Non-veteran care | Admitting: Hematology and Oncology

## 2017-09-27 VITALS — BP 95/60 | HR 113 | Temp 97.9°F | Resp 20 | Wt 137.5 lb

## 2017-09-27 DIAGNOSIS — Z7189 Other specified counseling: Secondary | ICD-10-CM

## 2017-09-27 DIAGNOSIS — R300 Dysuria: Secondary | ICD-10-CM | POA: Diagnosis not present

## 2017-09-27 DIAGNOSIS — M542 Cervicalgia: Secondary | ICD-10-CM

## 2017-09-27 DIAGNOSIS — Z902 Acquired absence of lung [part of]: Secondary | ICD-10-CM

## 2017-09-27 DIAGNOSIS — R634 Abnormal weight loss: Secondary | ICD-10-CM

## 2017-09-27 DIAGNOSIS — G893 Neoplasm related pain (acute) (chronic): Secondary | ICD-10-CM

## 2017-09-27 DIAGNOSIS — I471 Supraventricular tachycardia: Secondary | ICD-10-CM

## 2017-09-27 DIAGNOSIS — Z809 Family history of malignant neoplasm, unspecified: Secondary | ICD-10-CM

## 2017-09-27 DIAGNOSIS — C679 Malignant neoplasm of bladder, unspecified: Secondary | ICD-10-CM | POA: Diagnosis not present

## 2017-09-27 DIAGNOSIS — Z85118 Personal history of other malignant neoplasm of bronchus and lung: Secondary | ICD-10-CM

## 2017-09-27 DIAGNOSIS — C7951 Secondary malignant neoplasm of bone: Secondary | ICD-10-CM | POA: Diagnosis not present

## 2017-09-27 DIAGNOSIS — I959 Hypotension, unspecified: Secondary | ICD-10-CM | POA: Diagnosis not present

## 2017-09-27 DIAGNOSIS — Z79899 Other long term (current) drug therapy: Secondary | ICD-10-CM

## 2017-09-27 DIAGNOSIS — R61 Generalized hyperhidrosis: Secondary | ICD-10-CM

## 2017-09-27 DIAGNOSIS — R509 Fever, unspecified: Secondary | ICD-10-CM

## 2017-09-27 DIAGNOSIS — Z7982 Long term (current) use of aspirin: Secondary | ICD-10-CM

## 2017-09-27 DIAGNOSIS — C672 Malignant neoplasm of lateral wall of bladder: Secondary | ICD-10-CM

## 2017-09-27 DIAGNOSIS — I10 Essential (primary) hypertension: Secondary | ICD-10-CM

## 2017-09-27 DIAGNOSIS — I739 Peripheral vascular disease, unspecified: Secondary | ICD-10-CM | POA: Diagnosis not present

## 2017-09-27 DIAGNOSIS — R05 Cough: Secondary | ICD-10-CM

## 2017-09-27 DIAGNOSIS — R0602 Shortness of breath: Secondary | ICD-10-CM

## 2017-09-27 DIAGNOSIS — F17210 Nicotine dependence, cigarettes, uncomplicated: Secondary | ICD-10-CM

## 2017-09-27 DIAGNOSIS — Z7902 Long term (current) use of antithrombotics/antiplatelets: Secondary | ICD-10-CM

## 2017-09-27 MED ORDER — OXYCODONE HCL 5 MG PO CAPS: 5.0000 mg | ORAL_CAPSULE | ORAL | 0 refills | Status: DC | PRN

## 2017-09-27 NOTE — Progress Notes (Signed)
Patient states when he stands up he gets lightheaded to the point he feels like he is going to pass out.  BP today sitting 95/60 HR 113 - sitting 85/61 HR 120.  Also wants to know if Dr. Mike Gip will manage his pain medications or does he need to go back to [place] (does not wish to go back there).  He has a few pills left but will soon need a refill.

## 2017-09-27 NOTE — Progress Notes (Signed)
[facility] day:  09/27/2017  Chief Complaint: Patrick Hooper is a 71 y.o. adult with bone metastasis and a distant history of stage IB lung cancer and muscle invasive bladder cancer who is seen for review of interval paravertebral biopsy and discussion regarding direction of therapy.  HPI:  The patient was last seen in the medical oncology clinic on 09/12/2017.  At that time, he was fatigued.  He had urinary difficulties. He had low grade fevers, drenching sweats, and weight loss.  He had a chronic cough and shortness of breath s/p lung resection. He continued to smoke on a daily basis. He had opioid induced constipation.  He had claudication.  Exam revealed tenderness in the lower thoracic spine and L2-L3 area.  At last visit, we discussed biopsy to confirm metastatic bladder cancer.  He was referred to radiation oncology for palliative radiation.  He received Xgeva on 09/16/2017.  He was seen by Dr. Baruch Gouty on 09/16/2017.  Discussions were held regarding palliative radiation therapy to his cervical and thoracic spine. Plan was to treat both areas simultaneously up to 3000 cGy in 10 fractions.  He underwent CT simulation on 09/21/2017. Radiation treatments planned to start the end of this week.   He underwent CT guided biopsy of the posterior spinal mass on 09/21/2017.  Pathology confirmed metastatic urothelial carcinoma.  Tumor was positive for GATA3 and P40 and negative for TTF1 and napsin A.  Pathology was c/w the patient's known bladder cancer.  During the interim, patient has been feeling "terrible". Patient notes that he is "lightheaded and dizzy". Patient with orthostatic changes in the clinic today. He is HYPOtensive and TACHYcardic. Patient is eating and drinking well.   Patient has significant pain in his neck. He has been taking Oxycodone IR and MS-Contin. He notes that the long acting analgesic makes him "goofy" and that it causes him to  sleep a lot. Patient continues to experience intermittent B symptoms. He is eating "ok".  He has lost 3 pounds.     Past Medical History:  Diagnosis Date  . Bladder cancer ([place])   . Hypertension   . Lung cancer ([place]) 2016    Past Surgical History:  Procedure Laterality Date  . BLADDER SURGERY  08/12/2017  . BYPASS GRAFT FEMORAL/POPLITEAL W/VEIN  2009  . COLONOSCOPY  2013  . CYSTOSCOPY  08/12/2017  . IR ABDOMINAL AORTOBIFEMORAL CATHETER SERIALOGRAM  2009  . LOBECTOMY Right 09/2015  . PACEMAKER INSERTION  2003    Family History  Problem Relation Age of Onset  . Cancer Mother   . Cancer Sister   . Cancer Maternal Grandmother     Social History:  reports that he has been smoking cigarettes.  He has a 55.00 pack-year smoking history. he has never used smokeless tobacco. He reports that he drinks alcohol. He reports that he does not use drugs.  He smokes 1 pack a day for > 55 years.  He used to drink 4 alcoholic beverages/day.  Now his alcohol intake is infrequent. 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 There is the potential of past exposure to radiation and/or toxins. He is registered with the [place] regarding his exposures.  He lives in [place].  The patient is accompanied by his girlfriend Optometrist) today.  Allergies:  Allergies  Allergen Reactions  . Cefazolin Rash and Shortness Of Breath  . Carvedilol     Other reaction(s): Unknown  .  Chlorthalidone     Other reaction(s): Unknown  . Hydralazine     Other reaction(s): Unknown    Current Medications: Current Outpatient Medications  Medication Sig Dispense Refill  . amLODipine (NORVASC) 10 MG tablet Take 10 mg daily by mouth.    Marland Kitchen aspirin 81 MG chewable tablet Chew 81 mg daily by mouth.    . clopidogrel (PLAVIX) 75 MG tablet Take 75 mg daily by mouth.    . docusate sodium (COLACE) 100 MG capsule Take 100 mg 2 (two) times daily by mouth.    Marland Kitchen ibuprofen (ADVIL,MOTRIN) 200  MG tablet Take 400 mg every 6 (six) hours as needed by mouth.    Marland Kitchen lisinopril (PRINIVIL,ZESTRIL) 20 MG tablet Take 20 mg daily by mouth.    . mirtazapine (REMERON) 15 MG tablet Take 15 mg at bedtime by mouth.    . morphine (MSIR) 15 MG tablet Take 15 mg every 4 (four) hours as needed by mouth for moderate pain or severe pain.    Marland Kitchen oxycodone (OXY-IR) 5 MG capsule Take 5 mg every 4 (four) hours as needed by mouth for pain.     No current facility-administered medications for this visit.     Review of Systems:  GENERAL:  Fatigue.  Low grade fever and sweats.  Weight loss of 10-15 pounds in the last 1-2 months; down 3 pounds since last visit. PERFORMANCE STATUS (ECOG):  2 HEENT:  No visual changes, sore throat, mouth sores or tenderness. Lungs: Shortness of breath with exertion.  Cough.  No hemoptysis. Cardiac:  Implanted pacemaker (NOT MRI COMPATIBLE). No chest pain, palpitations, orthopnea, or PND.  Blood pressure fluctuates. GI: Eating and drinking.  Constipation secondary to narcotics.  No nausea, vomiting, diarrhea, melena or hematochezia. GU:  Dribbling.  Urgency, frequency, dysuria and nocturia.  No hematuria. Musculoskeletal:  Neck pain.  Bone pain (lower ribs, shoulders, back, neck, shoulder).  Joint pain (left wrist and right elbow). No muscle tenderness. Extremities:  No pain or swelling. Skin:  No rashes or skin changes. Neuro:  Minor headache.  No numbness or weakness, balance or coordination issues. Endocrine:  No diabetes, thyroid issues, hot flashes or night sweats. Psych:  No mood changes, depression or anxiety. Pain:  Bone pain. Review of systems:  All other systems reviewed and found to be negative.  Physical Exam: Blood pressure 95/60, pulse (!) 113, temperature 97.9 F (36.6 C), temperature source Tympanic, resp. rate 20, weight 137 lb 8 oz (62.4 kg). GENERAL:  Well developed, well nourished, gentleman sitting comfortably in the exam room in no acute distress. MENTAL  STATUS:  Alert and oriented to person, place and time. HEAD:  Thin gray hair.  Normocephalic, atraumatic, face symmetric, no Cushingoid features. EYES:  Blue eyes.  Pupils equal round and reactive to light and accomodation.  No conjunctivitis or scleral icterus. ENT:  Oropharynx clear without lesion.  Tongue normal.  Upper dentures.  No lower teeth.  Mucous membranes moist.  NECK:  Well healed scar right sided scar. CHEST:  Left sided pacemaker. RESPIRATORY:  Clear to auscultation without rales, wheezes or rhonchi. CARDIOVASCULAR:  Regular rate and rhythm without murmur, rub or gallop. ABDOMEN:  Soft, non-tender, with active bowel sounds, and no hepatosplenomegaly.  No masses. SKIN:  No rashes, ulcers or lesions. EXTREMITIES: Nicotine stained nails.  No edema, no skin discoloration or tenderness.  No palpable cords. LYMPH NODES: No palpable cervical, supraclavicular, axillary or inguinal adenopathy  NEUROLOGICAL: Unremarkable. PSYCH:  Appropriate.   No visits with  results within 3 Day(s) from this visit.  Latest known visit with results is:  Hospital Outpatient Visit on 09/21/2017  Component Date Value Ref Range Status  . WBC 09/21/2017 10.9* 3.8 - 10.6 K/uL Final  . RBC 09/21/2017 5.15  4.40 - 5.90 MIL/uL Final  . Hemoglobin 09/21/2017 15.2  13.0 - 18.0 g/dL Final  . HCT 09/21/2017 45.4  40.0 - 52.0 % Final  . MCV 09/21/2017 88.1  80.0 - 100.0 fL Final  . MCH 09/21/2017 29.6  26.0 - 34.0 pg Final  . MCHC 09/21/2017 33.6  32.0 - 36.0 g/dL Final  . RDW 09/21/2017 14.0  11.5 - 14.5 % Final  . Platelets 09/21/2017 545* 150 - 440 K/uL Final  . Neutrophils Relative % 09/21/2017 73  % Final  . Neutro Abs 09/21/2017 7.9* 1.4 - 6.5 K/uL Final  . Lymphocytes Relative 09/21/2017 19  % Final  . Lymphs Abs 09/21/2017 2.0  1.0 - 3.6 K/uL Final  . Monocytes Relative 09/21/2017 7  % Final  . Monocytes Absolute 09/21/2017 0.7  0.2 - 1.0 K/uL Final  . Eosinophils Relative 09/21/2017 2  % Final  .  Eosinophils Absolute 09/21/2017 0.2  0 - 0.7 K/uL Final  . Basophils Relative 09/21/2017 1  % Final  . Basophils Absolute 09/21/2017 0.1  0 - 0.1 K/uL Final  . Prothrombin Time 09/21/2017 12.7  11.4 - 15.2 seconds Final  . INR 09/21/2017 0.96   Final  . SURGICAL PATHOLOGY 09/21/2017    Final                   Value:Surgical Pathology CASE: [id number] PATIENT: Patrick Hooper Surgical Pathology Report     SPECIMEN SUBMITTED: A. Paraspinal mass, left posterior  CLINICAL HISTORY: Widespread metastatic disease, history of stage IB adenocarcinoma of the lung, status post right upper lobe lobectomy in 09/2015, T2aN0M0. Recent diagnosis of muscle-invasive urothelial carcinoma of the bladder.  PRE-OPERATIVE DIAGNOSIS: None provided  POST-OPERATIVE DIAGNOSIS: None provided.     DIAGNOSIS: A. SOFT TISSUE MASS, LEFT PARASPINAL MUSCLE; CT-GUIDED CORE BIOPSY: - METASTATIC UROTHELIAL CARCINOMA.  Comment: Metastatic carcinoma is present, with infiltration of skeletal muscle. The carcinoma shows solid growth pattern with focal necrosis. The clinical history is noted, and a limited immunohistochemistry (IHC) panel is performed for further characterization. The neoplastic cells are positive for GATA3 and p40, with diffuse nuclear staining. The cells are negative for TTF1 and naps                         in A. This IHC profile supports urothelial origin and is consistent with the patient's known bladder carcinoma.  There is adequate tissue for ancillary testing.  The diagnosis was called to Dr. Mike Gip on 09/26/17 at 3:16 PM.  IHC slides were prepared by [facility] for Molecular Biology and Pathology, [place], [place], and interpreted by Dr. Dicie Beam. All controls stained appropriately.   GROSS DESCRIPTION: A. Labeled: Left posterior paraspinal mass Tissue fragment(s): 3 Size: Ranging from 0.6-0.7 cm in length and 0.1 cm in diameter Description: Needle core biopsy fragments of pale  tan soft tissue. Wrapped in lens paper and submitted in a mesh bag.  Entirely submitted in 1-2 cassette(s).  Final Diagnosis performed by Bryan Lemma, MD.  Electronically signed 09/26/2017 3:49:14PM    The electronic signature indicates that the named Attending Pathologist has evaluated the specimen  Technical component performed at [facility], [address], [place],  [address] Lab: 8  [phone] Dir: Rush Farmer, MD, MMM  Professional component performed at [facility], [place], [place], [place] [address] Lab: [phone] Dir: Dellia Nims. Rubinas, MD      Assessment:  Patrick Hooper is a 71 y.o. adult with metastatic urothelial carcinoma s/p CT guided biopsy of the posterior spinal mass on 09/21/2017.  Pathology confirmed metastatic urothelial carcinoma. He has a recent history of muscle invasive bladder cancer and a distant history of lung cancer.  He has a history of stage IB lung cancer status post right upper lobe lobectomy in 09/2015.  Pathology revealed a T2aN0M0 adenocarcinoma.  Chest CT on 05/25/2017 revealed postoperative changes from a right upper lobe lobectomy without evidence of recurrence. There was new diffuse punctate sclerotic osseous lesions throughout the chest concerning for metastatic disease.  PET scan on 06/08/2017 at the [facility] revealed right upper lobe lobectomy without evidence of recurrence. There was a new 3.2 cm mass along the right inferior bladder wall, anterior to the ureteral vesicular junction.  There were multiple new focal sclerotic and some probable mixed lytic and sclerotic osseous lesions in the axial skeleton many of which demonstrated increased FDG avidity. Differential diagnoses included include metastatic lung cancer, bladder cancer or prostate cancer. There was increased FDG uptake in the left medial thigh musculature of uncertain etiology.  PET scan on 09/09/2017  revealed several hypermetabolic mixed lytic and sclerotic osseous metastases throughout the axial skeleton.  There was suspected intramuscular soft tissue metastases in the right quadratus lumborum and posterior left lumbar paraspinal musculature.  There was mildly hypermetabolic retroperitoneal lymphadenopathy, probably representing nodal metastases.  There was diffuse irregular bladder wall thickening, asymmetrically prominent in the right bladder wall, suspicious for residual/recurrent bladder neoplasm.   There was nonspecific small ground-glass nodular opacity in the left upper lung lobe with low level metabolism.  There was no evidence of local tumor recurrence in the right lung status post right upper lobectomy   He underwent transurethral resection of bladder tumor (TURBT) with bilateral retrograde pyelogram on 08/12/2017.  He had a 3 cm bladder tumor on the right lateral wall with some sessile and papillary features.  Retrograde pyelograms were normal without filling defect.  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  He began monthly Xgeva on 09/16/2017.  He has a history of peripheral vascular disease s/p fem-fem bypass in 2016. He is on Plavix.  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  Colonoscopy was normal in 2011.  Symptomatically, he is fatigued.  He has urinary difficulties. He has low grade fevers, drenching sweats, and weight loss.  He has a chronic cough and shortness of breath s/p lung resection. He continues to smoke on a daily basis. He has opioid induced constipation.  Exam reveals tenderness in the lower thoracic spine and L2-L3 area.  Plan: 1.  Review posterior spinal mass biopsy.  Pathology confirmed metastatic urothelial carcinoma.  Discuss treatment for metastatic bladder cancer. Discuss platinum based chemotherapy (carboplatin versus cisplatin + gemcitabine).  Patient is not a candidate for bladder  resection secondary to metastatic disease.  Side effects of medications reviewed. Discuss need for port-a-cath placement and chemotherapy education class.   2.  RTC on 10/14/2017 for labs (BMP) and Xgeva.  3.  Discuss plan for PDL-1 testing. Biopsy was done at the [place]. Discuss possibility of immunotherapy (Tencentriq) based on the results of the testing. Results pending at this time.  4.  Discuss  pain management. Patient asked to maintain a pain diary. Discussed using long acting medication (MS-Contin) first, with Oxycodone IR being used for breakthrough.  5.  Refer to vascular (Dr Lucky Cowboy) for central venous access placement. 6.  Schedule for chemotherapy education class next week.  7.  Refill Rx: Oxycodone 53m q4 PRN (Disp #30) 8.  Discuss plan for palliative radiation. 9.  Discuss HYPOtension. Patient advised to HOLD Amlodipine. Discuss recheck next week. 10.  RTC in 1 week for MD assessment, labs (CBC with diff, CMP, Mg, LDH), and to define course of treatment.    BHonor Loh NP  09/27/2017, 12:13 PM   I saw and evaluated the patient, participating in the key portions of the service and reviewing pertinent diagnostic studies and records.  I reviewed the nurse practitioner's note and agree with the findings and the plan.  The assessment and plan were discussed with the patient.  Multiple questions were asked by the patient and answered.   MNolon Stalls MD 09/27/2017, 12:13 PM

## 2017-09-27 NOTE — Patient Instructions (Signed)
Atezolizumab injection What is this medicine? ATEZOLIZUMAB (a te zoe LIZ ue mab) is a monoclonal antibody. It is used to treat bladder cancer (urothelial cancer) and non-small cell lung cancer. This medicine may be used for other purposes; ask your health care provider or pharmacist if you have questions. COMMON BRAND NAME(S): Tecentriq What should I tell my health care provider before I take this medicine? They need to know if you have any of these conditions: -diabetes -immune system problems -infection -inflammatory bowel disease -liver disease -lung or breathing disease -lupus -nervous system problems like myasthenia gravis or Guillain-Barre syndrome -organ transplant -an unusual or allergic reaction to atezolizumab, other medicines, foods, dyes, or preservatives -pregnant or trying to get pregnant -breast-feeding How should I use this medicine? This medicine is for infusion into a vein. It is given by a health care professional in a hospital or clinic setting. A special MedGuide will be given to you before each treatment. Be sure to read this information carefully each time. Talk to your pediatrician regarding the use of this medicine in children. Special care may be needed. Overdosage: If you think you have taken too much of this medicine contact a poison control center or emergency room at once. NOTE: This medicine is only for you. Do not share this medicine with others. What if I miss a dose? It is important not to miss your dose. Call your doctor or health care professional if you are unable to keep an appointment. What may interact with this medicine? Interactions have not been studied. This list may not describe all possible interactions. Give your health care provider a list of all the medicines, herbs, non-prescription drugs, or dietary supplements you use. Also tell them if you smoke, drink alcohol, or use illegal drugs. Some items may interact with your medicine. What  should I watch for while using this medicine? Your condition will be monitored carefully while you are receiving this medicine. You may need blood work done while you are taking this medicine. Do not become pregnant while taking this medicine or for at least 5 months after stopping it. Women should inform their doctor if they wish to become pregnant or think they might be pregnant. There is a potential for serious side effects to an unborn child. Talk to your health care professional or pharmacist for more information. Do not breast-feed an infant while taking this medicine or for at least 5 months after the last dose. What side effects may I notice from receiving this medicine? Side effects that you should report to your doctor or health care professional as soon as possible: -allergic reactions like skin rash, itching or hives, swelling of the face, lips, or tongue -black, tarry stools -bloody or watery diarrhea -breathing problems -changes in vision -chest pain or chest tightness -chills -facial flushing -fever -headache -signs and symptoms of high blood sugar such as dizziness; dry mouth; dry skin; fruity breath; nausea; stomach pain; increased hunger or thirst; increased urination -signs and symptoms of liver injury like dark yellow or brown urine; general ill feeling or flu-like symptoms; light-colored stools; loss of appetite; nausea; right upper belly pain; unusually weak or tired; yellowing of the eyes or skin -stomach pain -trouble passing urine or change in the amount of urine Side effects that usually do not require medical attention (report to your doctor or health care professional if they continue or are bothersome): -cough -diarrhea -joint pain -muscle pain -muscle weakness -tiredness -weight loss This list may not describe all  possible side effects. Call your doctor for medical advice about side effects. You may report side effects to FDA at 1-800-FDA-1088. Where should  I keep my medicine? This drug is given in a hospital or clinic and will not be stored at home. NOTE: This sheet is a summary. It may not cover all possible information. If you have questions about this medicine, talk to your doctor, pharmacist, or health care provider.  2018 [employer] Standard (2015-11-19 17:54:14) Gemcitabine injection What is this medicine? GEMCITABINE (jem SIT a been) is a chemotherapy drug. This medicine is used to treat many types of cancer like breast cancer, lung cancer, pancreatic cancer, and ovarian cancer. This medicine may be used for other purposes; ask your health care provider or pharmacist if you have questions. COMMON BRAND NAME(S): Gemzar What should I tell my health care provider before I take this medicine? They need to know if you have any of these conditions: -blood disorders -infection -kidney disease -liver disease -recent or ongoing radiation therapy -an unusual or allergic reaction to gemcitabine, other chemotherapy, other medicines, foods, dyes, or preservatives -pregnant or trying to get pregnant -breast-feeding How should I use this medicine? This drug is given as an infusion into a vein. It is administered in a hospital or clinic by a specially trained health care professional. Talk to your pediatrician regarding the use of this medicine in children. Special care may be needed. Overdosage: If you think you have taken too much of this medicine contact a poison control center or emergency room at once. NOTE: This medicine is only for you. Do not share this medicine with others. What if I miss a dose? It is important not to miss your dose. Call your doctor or health care professional if you are unable to keep an appointment. What may interact with this medicine? -medicines to increase blood counts like filgrastim, pegfilgrastim, sargramostim -some other chemotherapy drugs like cisplatin -vaccines Talk to your doctor or health care  professional before taking any of these medicines: -acetaminophen -aspirin -ibuprofen -ketoprofen -naproxen This list may not describe all possible interactions. Give your health care provider a list of all the medicines, herbs, non-prescription drugs, or dietary supplements you use. Also tell them if you smoke, drink alcohol, or use illegal drugs. Some items may interact with your medicine. What should I watch for while using this medicine? Visit your doctor for checks on your progress. This drug may make you feel generally unwell. This is not uncommon, as chemotherapy can affect healthy cells as well as cancer cells. Report any side effects. Continue your course of treatment even though you feel ill unless your doctor tells you to stop. In some cases, you may be given additional medicines to help with side effects. Follow all directions for their use. Call your doctor or health care professional for advice if you get a fever, chills or sore throat, or other symptoms of a cold or flu. Do not treat yourself. This drug decreases your body's ability to fight infections. Try to avoid being around people who are sick. This medicine may increase your risk to bruise or bleed. Call your doctor or health care professional if you notice any unusual bleeding. Be careful brushing and flossing your teeth or using a toothpick because you may get an infection or bleed more easily. If you have any dental work done, tell your dentist you are receiving this medicine. Avoid taking products that contain aspirin, acetaminophen, ibuprofen, naproxen, or ketoprofen unless instructed by your doctor.  These medicines may hide a fever. Women should inform their doctor if they wish to become pregnant or think they might be pregnant. There is a potential for serious side effects to an unborn child. Talk to your health care professional or pharmacist for more information. Do not breast-feed an infant while taking this  medicine. What side effects may I notice from receiving this medicine? Side effects that you should report to your doctor or health care professional as soon as possible: -allergic reactions like skin rash, itching or hives, swelling of the face, lips, or tongue -low blood counts - this medicine may decrease the number of white blood cells, red blood cells and platelets. You may be at increased risk for infections and bleeding. -signs of infection - fever or chills, cough, sore throat, pain or difficulty passing urine -signs of decreased platelets or bleeding - bruising, pinpoint red spots on the skin, black, tarry stools, blood in the urine -signs of decreased red blood cells - unusually weak or tired, fainting spells, lightheadedness -breathing problems -chest pain -mouth sores -nausea and vomiting -pain, swelling, redness at site where injected -pain, tingling, numbness in the hands or feet -stomach pain -swelling of ankles, feet, hands -unusual bleeding Side effects that usually do not require medical attention (report to your doctor or health care professional if they continue or are bothersome): -constipation -diarrhea -hair loss -loss of appetite -stomach upset This list may not describe all possible side effects. Call your doctor for medical advice about side effects. You may report side effects to FDA at 1-800-FDA-1088. Where should I keep my medicine? This drug is given in a hospital or clinic and will not be stored at home. NOTE: This sheet is a summary. It may not cover all possible information. If you have questions about this medicine, talk to your doctor, pharmacist, or health care provider.  2018 [employer] Standard (2008-02-27 18:45:54) Cisplatin injection What is this medicine? CISPLATIN (SIS pla tin) is a chemotherapy drug. It targets fast dividing cells, like cancer cells, and causes these cells to die. This medicine is used to treat many types of cancer like  bladder, ovarian, and testicular cancers. This medicine may be used for other purposes; ask your health care provider or pharmacist if you have questions. COMMON BRAND NAME(S): Platinol, Platinol -AQ What should I tell my health care provider before I take this medicine? They need to know if you have any of these conditions: -blood disorders -hearing problems -kidney disease -recent or ongoing radiation therapy -an unusual or allergic reaction to cisplatin, carboplatin, other chemotherapy, other medicines, foods, dyes, or preservatives -pregnant or trying to get pregnant -breast-feeding How should I use this medicine? This drug is given as an infusion into a vein. It is administered in a hospital or clinic by a specially trained health care professional. Talk to your pediatrician regarding the use of this medicine in children. Special care may be needed. Overdosage: If you think you have taken too much of this medicine contact a poison control center or emergency room at once. NOTE: This medicine is only for you. Do not share this medicine with others. What if I miss a dose? It is important not to miss a dose. Call your doctor or health care professional if you are unable to keep an appointment. What may interact with this medicine? -dofetilide -foscarnet -medicines for seizures -medicines to increase blood counts like filgrastim, pegfilgrastim, sargramostim -probenecid -pyridoxine used with altretamine -rituximab -some antibiotics like amikacin, gentamicin, neomycin, polymyxin  B, streptomycin, tobramycin -sulfinpyrazone -vaccines -zalcitabine Talk to your doctor or health care professional before taking any of these medicines: -acetaminophen -aspirin -ibuprofen -ketoprofen -naproxen This list may not describe all possible interactions. Give your health care provider a list of all the medicines, herbs, non-prescription drugs, or dietary supplements you use. Also tell them if you  smoke, drink alcohol, or use illegal drugs. Some items may interact with your medicine. What should I watch for while using this medicine? Your condition will be monitored carefully while you are receiving this medicine. You will need important blood work done while you are taking this medicine. This drug may make you feel generally unwell. This is not uncommon, as chemotherapy can affect healthy cells as well as cancer cells. Report any side effects. Continue your course of treatment even though you feel ill unless your doctor tells you to stop. In some cases, you may be given additional medicines to help with side effects. Follow all directions for their use. Call your doctor or health care professional for advice if you get a fever, chills or sore throat, or other symptoms of a cold or flu. Do not treat yourself. This drug decreases your body's ability to fight infections. Try to avoid being around people who are sick. This medicine may increase your risk to bruise or bleed. Call your doctor or health care professional if you notice any unusual bleeding. Be careful brushing and flossing your teeth or using a toothpick because you may get an infection or bleed more easily. If you have any dental work done, tell your dentist you are receiving this medicine. Avoid taking products that contain aspirin, acetaminophen, ibuprofen, naproxen, or ketoprofen unless instructed by your doctor. These medicines may hide a fever. Do not become pregnant while taking this medicine. Women should inform their doctor if they wish to become pregnant or think they might be pregnant. There is a potential for serious side effects to an unborn child. Talk to your health care professional or pharmacist for more information. Do not breast-feed an infant while taking this medicine. Drink fluids as directed while you are taking this medicine. This will help protect your kidneys. Call your doctor or health care professional if you  get diarrhea. Do not treat yourself. What side effects may I notice from receiving this medicine? Side effects that you should report to your doctor or health care professional as soon as possible: -allergic reactions like skin rash, itching or hives, swelling of the face, lips, or tongue -signs of infection - fever or chills, cough, sore throat, pain or difficulty passing urine -signs of decreased platelets or bleeding - bruising, pinpoint red spots on the skin, black, tarry stools, nosebleeds -signs of decreased red blood cells - unusually weak or tired, fainting spells, lightheadedness -breathing problems -changes in hearing -gout pain -low blood counts - This drug may decrease the number of white blood cells, red blood cells and platelets. You may be at increased risk for infections and bleeding. -nausea and vomiting -pain, swelling, redness or irritation at the injection site -pain, tingling, numbness in the hands or feet -problems with balance, movement -trouble passing urine or change in the amount of urine Side effects that usually do not require medical attention (report to your doctor or health care professional if they continue or are bothersome): -changes in vision -loss of appetite -metallic taste in the mouth or changes in taste This list may not describe all possible side effects. Call your doctor for medical advice  about side effects. You may report side effects to FDA at 1-800-FDA-1088. Where should I keep my medicine? This drug is given in a hospital or clinic and will not be stored at home. NOTE: This sheet is a summary. It may not cover all possible information. If you have questions about this medicine, talk to your doctor, pharmacist, or health care provider.  2018 [employer] Standard (2008-01-23 14:40:54) Carboplatin injection What is this medicine? CARBOPLATIN (KAR boe pla tin) is a chemotherapy drug. It targets fast dividing cells, like cancer cells, and causes  these cells to die. This medicine is used to treat ovarian cancer and many other cancers. This medicine may be used for other purposes; ask your health care provider or pharmacist if you have questions. COMMON BRAND NAME(S): Paraplatin What should I tell my health care provider before I take this medicine? They need to know if you have any of these conditions: -blood disorders -hearing problems -kidney disease -recent or ongoing radiation therapy -an unusual or allergic reaction to carboplatin, cisplatin, other chemotherapy, other medicines, foods, dyes, or preservatives -pregnant or trying to get pregnant -breast-feeding How should I use this medicine? This drug is usually given as an infusion into a vein. It is administered in a hospital or clinic by a specially trained health care professional. Talk to your pediatrician regarding the use of this medicine in children. Special care may be needed. Overdosage: If you think you have taken too much of this medicine contact a poison control center or emergency room at once. NOTE: This medicine is only for you. Do not share this medicine with others. What if I miss a dose? It is important not to miss a dose. Call your doctor or health care professional if you are unable to keep an appointment. What may interact with this medicine? -medicines for seizures -medicines to increase blood counts like filgrastim, pegfilgrastim, sargramostim -some antibiotics like amikacin, gentamicin, neomycin, streptomycin, tobramycin -vaccines Talk to your doctor or health care professional before taking any of these medicines: -acetaminophen -aspirin -ibuprofen -ketoprofen -naproxen This list may not describe all possible interactions. Give your health care provider a list of all the medicines, herbs, non-prescription drugs, or dietary supplements you use. Also tell them if you smoke, drink alcohol, or use illegal drugs. Some items may interact with your  medicine. What should I watch for while using this medicine? Your condition will be monitored carefully while you are receiving this medicine. You will need important blood work done while you are taking this medicine. This drug may make you feel generally unwell. This is not uncommon, as chemotherapy can affect healthy cells as well as cancer cells. Report any side effects. Continue your course of treatment even though you feel ill unless your doctor tells you to stop. In some cases, you may be given additional medicines to help with side effects. Follow all directions for their use. Call your doctor or health care professional for advice if you get a fever, chills or sore throat, or other symptoms of a cold or flu. Do not treat yourself. This drug decreases your body's ability to fight infections. Try to avoid being around people who are sick. This medicine may increase your risk to bruise or bleed. Call your doctor or health care professional if you notice any unusual bleeding. Be careful brushing and flossing your teeth or using a toothpick because you may get an infection or bleed more easily. If you have any dental work done, tell your dentist you  are receiving this medicine. Avoid taking products that contain aspirin, acetaminophen, ibuprofen, naproxen, or ketoprofen unless instructed by your doctor. These medicines may hide a fever. Do not become pregnant while taking this medicine. Women should inform their doctor if they wish to become pregnant or think they might be pregnant. There is a potential for serious side effects to an unborn child. Talk to your health care professional or pharmacist for more information. Do not breast-feed an infant while taking this medicine. What side effects may I notice from receiving this medicine? Side effects that you should report to your doctor or health care professional as soon as possible: -allergic reactions like skin rash, itching or hives, swelling of  the face, lips, or tongue -signs of infection - fever or chills, cough, sore throat, pain or difficulty passing urine -signs of decreased platelets or bleeding - bruising, pinpoint red spots on the skin, black, tarry stools, nosebleeds -signs of decreased red blood cells - unusually weak or tired, fainting spells, lightheadedness -breathing problems -changes in hearing -changes in vision -chest pain -high blood pressure -low blood counts - This drug may decrease the number of white blood cells, red blood cells and platelets. You may be at increased risk for infections and bleeding. -nausea and vomiting -pain, swelling, redness or irritation at the injection site -pain, tingling, numbness in the hands or feet -problems with balance, talking, walking -trouble passing urine or change in the amount of urine Side effects that usually do not require medical attention (report to your doctor or health care professional if they continue or are bothersome): -hair loss -loss of appetite -metallic taste in the mouth or changes in taste This list may not describe all possible side effects. Call your doctor for medical advice about side effects. You may report side effects to FDA at 1-800-FDA-1088. Where should I keep my medicine? This drug is given in a hospital or clinic and will not be stored at home. NOTE: This sheet is a summary. It may not cover all possible information. If you have questions about this medicine, talk to your doctor, pharmacist, or health care provider.  2018 [employer] Standard (2008-01-23 14:38:05)

## 2017-09-28 ENCOUNTER — Encounter (INDEPENDENT_AMBULATORY_CARE_PROVIDER_SITE_OTHER): Payer: Self-pay

## 2017-09-28 ENCOUNTER — Ambulatory Visit
Admission: RE | Admit: 2017-09-28 | Discharge: 2017-09-28 | Disposition: A | Discharge status: Home / Self Care | Payer: Non-veteran care | Source: Ambulatory Visit | Attending: Radiation Oncology | Admitting: Radiation Oncology

## 2017-09-28 ENCOUNTER — Other Ambulatory Visit (INDEPENDENT_AMBULATORY_CARE_PROVIDER_SITE_OTHER): Payer: Self-pay | Admitting: Vascular Surgery

## 2017-09-29 ENCOUNTER — Ambulatory Visit
Admission: RE | Admit: 2017-09-29 | Discharge: 2017-09-29 | Disposition: A | Discharge status: Home / Self Care | Payer: Non-veteran care | Source: Ambulatory Visit | Attending: Radiation Oncology | Admitting: Radiation Oncology

## 2017-09-29 ENCOUNTER — Other Ambulatory Visit: Payer: Self-pay | Admitting: Urgent Care

## 2017-09-29 LAB — PATHOLOGY

## 2017-09-30 ENCOUNTER — Ambulatory Visit
Admission: RE | Admit: 2017-09-30 | Discharge: 2017-09-30 | Disposition: A | Discharge status: Home / Self Care | Payer: Non-veteran care | Source: Ambulatory Visit | Attending: Radiation Oncology | Admitting: Radiation Oncology

## 2017-10-03 ENCOUNTER — Ambulatory Visit
Admission: RE | Admit: 2017-10-03 | Discharge: 2017-10-03 | Disposition: A | Discharge status: Home / Self Care | Payer: Non-veteran care | Source: Ambulatory Visit | Attending: Radiation Oncology | Admitting: Radiation Oncology

## 2017-10-03 NOTE — Patient Instructions (Signed)
Gemcitabine injection What is this medicine? GEMCITABINE (jem SIT a been) is a chemotherapy drug. This medicine is used to treat many types of cancer like breast cancer, lung cancer, pancreatic cancer, and ovarian cancer. This medicine may be used for other purposes; ask your health care provider or pharmacist if you have questions. COMMON BRAND NAME(S): Gemzar What should I tell my health care provider before I take this medicine? They need to know if you have any of these conditions: -blood disorders -infection -kidney disease -liver disease -recent or ongoing radiation therapy -an unusual or allergic reaction to gemcitabine, other chemotherapy, other medicines, foods, dyes, or preservatives -pregnant or trying to get pregnant -breast-feeding How should I use this medicine? This drug is given as an infusion into a vein. It is administered in a hospital or clinic by a specially trained health care professional. Talk to your pediatrician regarding the use of this medicine in children. Special care may be needed. Overdosage: If you think you have taken too much of this medicine contact a poison control center or emergency room at once. NOTE: This medicine is only for you. Do not share this medicine with others. What if I miss a dose? It is important not to miss your dose. Call your doctor or health care professional if you are unable to keep an appointment. What may interact with this medicine? -medicines to increase blood counts like filgrastim, pegfilgrastim, sargramostim -some other chemotherapy drugs like cisplatin -vaccines Talk to your doctor or health care professional before taking any of these medicines: -acetaminophen -aspirin -ibuprofen -ketoprofen -naproxen This list may not describe all possible interactions. Give your health care provider a list of all the medicines, herbs, non-prescription drugs, or dietary supplements you use. Also tell them if you smoke, drink alcohol,  or use illegal drugs. Some items may interact with your medicine. What should I watch for while using this medicine? Visit your doctor for checks on your progress. This drug may make you feel generally unwell. This is not uncommon, as chemotherapy can affect healthy cells as well as cancer cells. Report any side effects. Continue your course of treatment even though you feel ill unless your doctor tells you to stop. In some cases, you may be given additional medicines to help with side effects. Follow all directions for their use. Call your doctor or health care professional for advice if you get a fever, chills or sore throat, or other symptoms of a cold or flu. Do not treat yourself. This drug decreases your body's ability to fight infections. Try to avoid being around people who are sick. This medicine may increase your risk to bruise or bleed. Call your doctor or health care professional if you notice any unusual bleeding. Be careful brushing and flossing your teeth or using a toothpick because you may get an infection or bleed more easily. If you have any dental work done, tell your dentist you are receiving this medicine. Avoid taking products that contain aspirin, acetaminophen, ibuprofen, naproxen, or ketoprofen unless instructed by your doctor. These medicines may hide a fever. Women should inform their doctor if they wish to become pregnant or think they might be pregnant. There is a potential for serious side effects to an unborn child. Talk to your health care professional or pharmacist for more information. Do not breast-feed an infant while taking this medicine. What side effects may I notice from receiving this medicine? Side effects that you should report to your doctor or health care professional as   soon as possible: -allergic reactions like skin rash, itching or hives, swelling of the face, lips, or tongue -low blood counts - this medicine may decrease the number of white blood cells,  red blood cells and platelets. You may be at increased risk for infections and bleeding. -signs of infection - fever or chills, cough, sore throat, pain or difficulty passing urine -signs of decreased platelets or bleeding - bruising, pinpoint red spots on the skin, black, tarry stools, blood in the urine -signs of decreased red blood cells - unusually weak or tired, fainting spells, lightheadedness -breathing problems -chest pain -mouth sores -nausea and vomiting -pain, swelling, redness at site where injected -pain, tingling, numbness in the hands or feet -stomach pain -swelling of ankles, feet, hands -unusual bleeding Side effects that usually do not require medical attention (report to your doctor or health care professional if they continue or are bothersome): -constipation -diarrhea -hair loss -loss of appetite -stomach upset This list may not describe all possible side effects. Call your doctor for medical advice about side effects. You may report side effects to FDA at 1-800-FDA-1088. Where should I keep my medicine? This drug is given in a hospital or clinic and will not be stored at home. NOTE: This sheet is a summary. It may not cover all possible information. If you have questions about this medicine, talk to your doctor, pharmacist, or health care provider.  2018 [employer] Standard (2008-02-27 18:45:54) Cisplatin injection What is this medicine? CISPLATIN (SIS pla tin) is a chemotherapy drug. It targets fast dividing cells, like cancer cells, and causes these cells to die. This medicine is used to treat many types of cancer like bladder, ovarian, and testicular cancers. This medicine may be used for other purposes; ask your health care provider or pharmacist if you have questions. COMMON BRAND NAME(S): Platinol, Platinol -AQ What should I tell my health care provider before I take this medicine? They need to know if you have any of these conditions: -blood  disorders -hearing problems -kidney disease -recent or ongoing radiation therapy -an unusual or allergic reaction to cisplatin, carboplatin, other chemotherapy, other medicines, foods, dyes, or preservatives -pregnant or trying to get pregnant -breast-feeding How should I use this medicine? This drug is given as an infusion into a vein. It is administered in a hospital or clinic by a specially trained health care professional. Talk to your pediatrician regarding the use of this medicine in children. Special care may be needed. Overdosage: If you think you have taken too much of this medicine contact a poison control center or emergency room at once. NOTE: This medicine is only for you. Do not share this medicine with others. What if I miss a dose? It is important not to miss a dose. Call your doctor or health care professional if you are unable to keep an appointment. What may interact with this medicine? -dofetilide -foscarnet -medicines for seizures -medicines to increase blood counts like filgrastim, pegfilgrastim, sargramostim -probenecid -pyridoxine used with altretamine -rituximab -some antibiotics like amikacin, gentamicin, neomycin, polymyxin B, streptomycin, tobramycin -sulfinpyrazone -vaccines -zalcitabine Talk to your doctor or health care professional before taking any of these medicines: -acetaminophen -aspirin -ibuprofen -ketoprofen -naproxen This list may not describe all possible interactions. Give your health care provider a list of all the medicines, herbs, non-prescription drugs, or dietary supplements you use. Also tell them if you smoke, drink alcohol, or use illegal drugs. Some items may interact with your medicine. What should I watch for while using this   medicine? Your condition will be monitored carefully while you are receiving this medicine. You will need important blood work done while you are taking this medicine. This drug may make you feel generally  unwell. This is not uncommon, as chemotherapy can affect healthy cells as well as cancer cells. Report any side effects. Continue your course of treatment even though you feel ill unless your doctor tells you to stop. In some cases, you may be given additional medicines to help with side effects. Follow all directions for their use. Call your doctor or health care professional for advice if you get a fever, chills or sore throat, or other symptoms of a cold or flu. Do not treat yourself. This drug decreases your body's ability to fight infections. Try to avoid being around people who are sick. This medicine may increase your risk to bruise or bleed. Call your doctor or health care professional if you notice any unusual bleeding. Be careful brushing and flossing your teeth or using a toothpick because you may get an infection or bleed more easily. If you have any dental work done, tell your dentist you are receiving this medicine. Avoid taking products that contain aspirin, acetaminophen, ibuprofen, naproxen, or ketoprofen unless instructed by your doctor. These medicines may hide a fever. Do not become pregnant while taking this medicine. Women should inform their doctor if they wish to become pregnant or think they might be pregnant. There is a potential for serious side effects to an unborn child. Talk to your health care professional or pharmacist for more information. Do not breast-feed an infant while taking this medicine. Drink fluids as directed while you are taking this medicine. This will help protect your kidneys. Call your doctor or health care professional if you get diarrhea. Do not treat yourself. What side effects may I notice from receiving this medicine? Side effects that you should report to your doctor or health care professional as soon as possible: -allergic reactions like skin rash, itching or hives, swelling of the face, lips, or tongue -signs of infection - fever or chills, cough,  sore throat, pain or difficulty passing urine -signs of decreased platelets or bleeding - bruising, pinpoint red spots on the skin, black, tarry stools, nosebleeds -signs of decreased red blood cells - unusually weak or tired, fainting spells, lightheadedness -breathing problems -changes in hearing -gout pain -low blood counts - This drug may decrease the number of white blood cells, red blood cells and platelets. You may be at increased risk for infections and bleeding. -nausea and vomiting -pain, swelling, redness or irritation at the injection site -pain, tingling, numbness in the hands or feet -problems with balance, movement -trouble passing urine or change in the amount of urine Side effects that usually do not require medical attention (report to your doctor or health care professional if they continue or are bothersome): -changes in vision -loss of appetite -metallic taste in the mouth or changes in taste This list may not describe all possible side effects. Call your doctor for medical advice about side effects. You may report side effects to FDA at 1-800-FDA-1088. Where should I keep my medicine? This drug is given in a hospital or clinic and will not be stored at home. NOTE: This sheet is a summary. It may not cover all possible information. If you have questions about this medicine, talk to your doctor, pharmacist, or health care provider.  2018 [employer] Standard (2008-01-23 14:40:54) Carboplatin injection What is this medicine? CARBOPLATIN (KAR boe pla  tin) is a chemotherapy drug. It targets fast dividing cells, like cancer cells, and causes these cells to die. This medicine is used to treat ovarian cancer and many other cancers. This medicine may be used for other purposes; ask your health care provider or pharmacist if you have questions. COMMON BRAND NAME(S): Paraplatin What should I tell my health care provider before I take this medicine? They need to know if you  have any of these conditions: -blood disorders -hearing problems -kidney disease -recent or ongoing radiation therapy -an unusual or allergic reaction to carboplatin, cisplatin, other chemotherapy, other medicines, foods, dyes, or preservatives -pregnant or trying to get pregnant -breast-feeding How should I use this medicine? This drug is usually given as an infusion into a vein. It is administered in a hospital or clinic by a specially trained health care professional. Talk to your pediatrician regarding the use of this medicine in children. Special care may be needed. Overdosage: If you think you have taken too much of this medicine contact a poison control center or emergency room at once. NOTE: This medicine is only for you. Do not share this medicine with others. What if I miss a dose? It is important not to miss a dose. Call your doctor or health care professional if you are unable to keep an appointment. What may interact with this medicine? -medicines for seizures -medicines to increase blood counts like filgrastim, pegfilgrastim, sargramostim -some antibiotics like amikacin, gentamicin, neomycin, streptomycin, tobramycin -vaccines Talk to your doctor or health care professional before taking any of these medicines: -acetaminophen -aspirin -ibuprofen -ketoprofen -naproxen This list may not describe all possible interactions. Give your health care provider a list of all the medicines, herbs, non-prescription drugs, or dietary supplements you use. Also tell them if you smoke, drink alcohol, or use illegal drugs. Some items may interact with your medicine. What should I watch for while using this medicine? Your condition will be monitored carefully while you are receiving this medicine. You will need important blood work done while you are taking this medicine. This drug may make you feel generally unwell. This is not uncommon, as chemotherapy can affect healthy cells as well as  cancer cells. Report any side effects. Continue your course of treatment even though you feel ill unless your doctor tells you to stop. In some cases, you may be given additional medicines to help with side effects. Follow all directions for their use. Call your doctor or health care professional for advice if you get a fever, chills or sore throat, or other symptoms of a cold or flu. Do not treat yourself. This drug decreases your body's ability to fight infections. Try to avoid being around people who are sick. This medicine may increase your risk to bruise or bleed. Call your doctor or health care professional if you notice any unusual bleeding. Be careful brushing and flossing your teeth or using a toothpick because you may get an infection or bleed more easily. If you have any dental work done, tell your dentist you are receiving this medicine. Avoid taking products that contain aspirin, acetaminophen, ibuprofen, naproxen, or ketoprofen unless instructed by your doctor. These medicines may hide a fever. Do not become pregnant while taking this medicine. Women should inform their doctor if they wish to become pregnant or think they might be pregnant. There is a potential for serious side effects to an unborn child. Talk to your health care professional or pharmacist for more information. Do not breast-feed an infant  while taking this medicine. What side effects may I notice from receiving this medicine? Side effects that you should report to your doctor or health care professional as soon as possible: -allergic reactions like skin rash, itching or hives, swelling of the face, lips, or tongue -signs of infection - fever or chills, cough, sore throat, pain or difficulty passing urine -signs of decreased platelets or bleeding - bruising, pinpoint red spots on the skin, black, tarry stools, nosebleeds -signs of decreased red blood cells - unusually weak or tired, fainting spells,  lightheadedness -breathing problems -changes in hearing -changes in vision -chest pain -high blood pressure -low blood counts - This drug may decrease the number of white blood cells, red blood cells and platelets. You may be at increased risk for infections and bleeding. -nausea and vomiting -pain, swelling, redness or irritation at the injection site -pain, tingling, numbness in the hands or feet -problems with balance, talking, walking -trouble passing urine or change in the amount of urine Side effects that usually do not require medical attention (report to your doctor or health care professional if they continue or are bothersome): -hair loss -loss of appetite -metallic taste in the mouth or changes in taste This list may not describe all possible side effects. Call your doctor for medical advice about side effects. You may report side effects to FDA at 1-800-FDA-1088. Where should I keep my medicine? This drug is given in a hospital or clinic and will not be stored at home. NOTE: This sheet is a summary. It may not cover all possible information. If you have questions about this medicine, talk to your doctor, pharmacist, or health care provider.  2018 [employer] Standard (2008-01-23 14:38:05)

## 2017-10-04 ENCOUNTER — Ambulatory Visit
Admission: RE | Admit: 2017-10-04 | Discharge: 2017-10-04 | Disposition: A | Discharge status: Home / Self Care | Payer: Non-veteran care | Source: Ambulatory Visit | Attending: Radiation Oncology | Admitting: Radiation Oncology

## 2017-10-04 ENCOUNTER — Inpatient Hospital Stay: Payer: Non-veteran care

## 2017-10-05 ENCOUNTER — Inpatient Hospital Stay: Payer: Non-veteran care | Attending: Hematology and Oncology

## 2017-10-05 ENCOUNTER — Ambulatory Visit
Admission: RE | Admit: 2017-10-05 | Discharge: 2017-10-05 | Disposition: A | Discharge status: Home / Self Care | Payer: Non-veteran care | Source: Ambulatory Visit | Attending: Radiation Oncology | Admitting: Radiation Oncology

## 2017-10-05 DIAGNOSIS — K5903 Drug induced constipation: Secondary | ICD-10-CM | POA: Insufficient documentation

## 2017-10-05 DIAGNOSIS — F17210 Nicotine dependence, cigarettes, uncomplicated: Secondary | ICD-10-CM | POA: Insufficient documentation

## 2017-10-05 DIAGNOSIS — I739 Peripheral vascular disease, unspecified: Secondary | ICD-10-CM | POA: Insufficient documentation

## 2017-10-05 DIAGNOSIS — E860 Dehydration: Secondary | ICD-10-CM | POA: Insufficient documentation

## 2017-10-05 DIAGNOSIS — T402X5A Adverse effect of other opioids, initial encounter: Secondary | ICD-10-CM | POA: Insufficient documentation

## 2017-10-05 DIAGNOSIS — R5383 Other fatigue: Secondary | ICD-10-CM | POA: Insufficient documentation

## 2017-10-05 DIAGNOSIS — C679 Malignant neoplasm of bladder, unspecified: Secondary | ICD-10-CM | POA: Insufficient documentation

## 2017-10-05 DIAGNOSIS — M899 Disorder of bone, unspecified: Secondary | ICD-10-CM | POA: Insufficient documentation

## 2017-10-05 DIAGNOSIS — R05 Cough: Secondary | ICD-10-CM | POA: Insufficient documentation

## 2017-10-05 DIAGNOSIS — I10 Essential (primary) hypertension: Secondary | ICD-10-CM | POA: Insufficient documentation

## 2017-10-05 DIAGNOSIS — Z79899 Other long term (current) drug therapy: Secondary | ICD-10-CM | POA: Insufficient documentation

## 2017-10-05 DIAGNOSIS — Z7982 Long term (current) use of aspirin: Secondary | ICD-10-CM | POA: Insufficient documentation

## 2017-10-05 DIAGNOSIS — R000 Tachycardia, unspecified: Secondary | ICD-10-CM | POA: Insufficient documentation

## 2017-10-05 DIAGNOSIS — R634 Abnormal weight loss: Secondary | ICD-10-CM | POA: Insufficient documentation

## 2017-10-05 DIAGNOSIS — Z923 Personal history of irradiation: Secondary | ICD-10-CM | POA: Insufficient documentation

## 2017-10-05 DIAGNOSIS — I959 Hypotension, unspecified: Secondary | ICD-10-CM | POA: Insufficient documentation

## 2017-10-05 DIAGNOSIS — Z809 Family history of malignant neoplasm, unspecified: Secondary | ICD-10-CM | POA: Insufficient documentation

## 2017-10-05 DIAGNOSIS — R630 Anorexia: Secondary | ICD-10-CM | POA: Insufficient documentation

## 2017-10-05 DIAGNOSIS — R42 Dizziness and giddiness: Secondary | ICD-10-CM | POA: Insufficient documentation

## 2017-10-05 DIAGNOSIS — R112 Nausea with vomiting, unspecified: Secondary | ICD-10-CM | POA: Insufficient documentation

## 2017-10-05 DIAGNOSIS — M542 Cervicalgia: Secondary | ICD-10-CM | POA: Insufficient documentation

## 2017-10-05 DIAGNOSIS — Z85118 Personal history of other malignant neoplasm of bronchus and lung: Secondary | ICD-10-CM | POA: Insufficient documentation

## 2017-10-05 DIAGNOSIS — R0602 Shortness of breath: Secondary | ICD-10-CM | POA: Insufficient documentation

## 2017-10-06 ENCOUNTER — Ambulatory Visit
Admission: RE | Admit: 2017-10-06 | Discharge: 2017-10-06 | Disposition: A | Discharge status: Home / Self Care | Payer: Non-veteran care | Source: Ambulatory Visit | Attending: Radiation Oncology | Admitting: Radiation Oncology

## 2017-10-06 ENCOUNTER — Inpatient Hospital Stay: Payer: Non-veteran care

## 2017-10-06 ENCOUNTER — Encounter: Payer: Self-pay | Admitting: Hematology and Oncology

## 2017-10-06 ENCOUNTER — Inpatient Hospital Stay (HOSPITAL_BASED_OUTPATIENT_CLINIC_OR_DEPARTMENT_OTHER): Payer: Non-veteran care | Admitting: Hematology and Oncology

## 2017-10-06 ENCOUNTER — Other Ambulatory Visit: Payer: Self-pay | Admitting: Hematology and Oncology

## 2017-10-06 VITALS — BP 96/65 | HR 112 | Temp 97.9°F | Resp 18 | Wt 135.0 lb

## 2017-10-06 VITALS — BP 110/69 | HR 88 | Resp 18

## 2017-10-06 DIAGNOSIS — C679 Malignant neoplasm of bladder, unspecified: Secondary | ICD-10-CM | POA: Diagnosis present

## 2017-10-06 DIAGNOSIS — I10 Essential (primary) hypertension: Secondary | ICD-10-CM

## 2017-10-06 DIAGNOSIS — R42 Dizziness and giddiness: Secondary | ICD-10-CM | POA: Diagnosis not present

## 2017-10-06 DIAGNOSIS — C7951 Secondary malignant neoplasm of bone: Secondary | ICD-10-CM

## 2017-10-06 DIAGNOSIS — R0602 Shortness of breath: Secondary | ICD-10-CM | POA: Diagnosis not present

## 2017-10-06 DIAGNOSIS — T402X5A Adverse effect of other opioids, initial encounter: Secondary | ICD-10-CM

## 2017-10-06 DIAGNOSIS — C672 Malignant neoplasm of lateral wall of bladder: Secondary | ICD-10-CM

## 2017-10-06 DIAGNOSIS — R630 Anorexia: Secondary | ICD-10-CM

## 2017-10-06 DIAGNOSIS — Z85118 Personal history of other malignant neoplasm of bronchus and lung: Secondary | ICD-10-CM

## 2017-10-06 DIAGNOSIS — R05 Cough: Secondary | ICD-10-CM | POA: Diagnosis not present

## 2017-10-06 DIAGNOSIS — I959 Hypotension, unspecified: Secondary | ICD-10-CM | POA: Diagnosis not present

## 2017-10-06 DIAGNOSIS — G893 Neoplasm related pain (acute) (chronic): Secondary | ICD-10-CM

## 2017-10-06 DIAGNOSIS — I951 Orthostatic hypotension: Secondary | ICD-10-CM

## 2017-10-06 DIAGNOSIS — K5903 Drug induced constipation: Secondary | ICD-10-CM

## 2017-10-06 DIAGNOSIS — Z923 Personal history of irradiation: Secondary | ICD-10-CM

## 2017-10-06 DIAGNOSIS — E860 Dehydration: Secondary | ICD-10-CM

## 2017-10-06 DIAGNOSIS — Z79899 Other long term (current) drug therapy: Secondary | ICD-10-CM

## 2017-10-06 DIAGNOSIS — M899 Disorder of bone, unspecified: Secondary | ICD-10-CM

## 2017-10-06 DIAGNOSIS — Z7189 Other specified counseling: Secondary | ICD-10-CM

## 2017-10-06 DIAGNOSIS — F17210 Nicotine dependence, cigarettes, uncomplicated: Secondary | ICD-10-CM

## 2017-10-06 DIAGNOSIS — R000 Tachycardia, unspecified: Secondary | ICD-10-CM

## 2017-10-06 DIAGNOSIS — Z809 Family history of malignant neoplasm, unspecified: Secondary | ICD-10-CM

## 2017-10-06 DIAGNOSIS — R112 Nausea with vomiting, unspecified: Secondary | ICD-10-CM | POA: Diagnosis not present

## 2017-10-06 DIAGNOSIS — I739 Peripheral vascular disease, unspecified: Secondary | ICD-10-CM

## 2017-10-06 DIAGNOSIS — Z7982 Long term (current) use of aspirin: Secondary | ICD-10-CM

## 2017-10-06 DIAGNOSIS — R634 Abnormal weight loss: Secondary | ICD-10-CM

## 2017-10-06 DIAGNOSIS — M542 Cervicalgia: Secondary | ICD-10-CM | POA: Diagnosis not present

## 2017-10-06 DIAGNOSIS — R5383 Other fatigue: Secondary | ICD-10-CM

## 2017-10-06 LAB — COMPREHENSIVE METABOLIC PANEL
ALT: 14 U/L — ABNORMAL LOW (ref 17–63)
AST: 17 U/L (ref 15–41)
Albumin: 3 g/dL — ABNORMAL LOW (ref 3.5–5.0)
Alkaline Phosphatase: 126 U/L (ref 38–126)
Anion gap: 11 (ref 5–15)
BUN: 10 mg/dL (ref 6–20)
CO2: 22 mmol/L (ref 22–32)
Calcium: 7.3 mg/dL — ABNORMAL LOW (ref 8.9–10.3)
Chloride: 93 mmol/L — ABNORMAL LOW (ref 101–111)
Creatinine, Ser: 0.72 mg/dL (ref 0.61–1.24)
GFR calc Af Amer: >60 mL/min (ref >60)
GFR calc non Af Amer: >60 mL/min (ref >60)
Glucose, Bld: 109 mg/dL — ABNORMAL HIGH (ref 65–99)
Potassium: 3.6 mmol/L (ref 3.5–5.1)
Sodium: 126 mmol/L — ABNORMAL LOW (ref 135–145)
Total Bilirubin: 0.7 mg/dL (ref 0.3–1.2)
Total Protein: 6.9 g/dL (ref 6.5–8.1)

## 2017-10-06 LAB — CBC WITH DIFFERENTIAL/PLATELET
Basophils Absolute: 0.1 10*3/uL (ref 0–0.1)
Basophils Relative: 1 %
Eosinophils Absolute: 0.1 10*3/uL (ref 0–0.7)
Eosinophils Relative: 2 %
HCT: 38.6 % — ABNORMAL LOW (ref 40.0–52.0)
Hemoglobin: 13.3 g/dL (ref 13.0–18.0)
Lymphocytes Relative: 12 %
Lymphs Abs: 1.1 10*3/uL (ref 1.0–3.6)
MCH: 29.9 pg (ref 26.0–34.0)
MCHC: 34.5 g/dL (ref 32.0–36.0)
MCV: 86.5 fL (ref 80.0–100.0)
Monocytes Absolute: 0.7 10*3/uL (ref 0.2–1.0)
Monocytes Relative: 8 %
Neutro Abs: 6.9 10*3/uL — ABNORMAL HIGH (ref 1.4–6.5)
Neutrophils Relative %: 77 %
Platelets: 537 10*3/uL — ABNORMAL HIGH (ref 150–440)
RBC: 4.46 MIL/uL (ref 4.40–5.90)
RDW: 13.7 % (ref 11.5–14.5)
WBC: 9 10*3/uL (ref 3.8–10.6)

## 2017-10-06 LAB — LACTATE DEHYDROGENASE: LDH: 156 U/L (ref 98–192)

## 2017-10-06 LAB — MAGNESIUM: Magnesium: 2.5 mg/dL — ABNORMAL HIGH (ref 1.7–2.4)

## 2017-10-06 MED ORDER — MEGESTROL ACETATE 40 MG/ML PO SUSP: 200.0000 mg | Freq: Every day | ORAL | 0 refills | Status: DC

## 2017-10-06 MED ORDER — SODIUM CHLORIDE 0.9 % IV SOLN
Freq: Once | INTRAVENOUS | Status: AC
  Administered 2017-10-06: 11:00:00 via INTRAVENOUS
  Filled 2017-10-06: qty 1000

## 2017-10-06 NOTE — Progress Notes (Signed)
[facility] day:  10/06/2017  Chief Complaint: Patrick Hooper is a 71 y.o. adult with a distant history of stage IB lung cancer and metastatic bladder cancer who is seen for finalization of treatment plan.  HPI:  The patient was last seen in the medical oncology clinic on 09/27/2017.  At that time, CT guided posterior spinal mass confirmed metastatic bladder cancer.  We discussed palliative chemotherapy consisting of cisplatin or carboplatin with gemcitabine.  Information was provided.  Chemotherapy class was scheduled.  We discussed port placement.  During the interim, patient continues to feel "terrible". Patient continues to be "lightheaded and dizzy". Patient with orthostatic changes in the clinic today. He is HYPOtensive and TACHYcardic. Patient is eating and drinking well.  During his last visit, we asked him to hold his Amlodipine. He has not taken amlodipine since 09/27/2017.  Patient has pain in his neck. He has been taking Oxycodone IR and MS-Contin. He is experiencing opioid induced constipation. Patient continues to have intermittent B symptoms. His appetite continues to be poor. He has lost 2 pounds since his last visit.   Patient attended chemotherapy education class on 10/05/2017. Patient currently receiving radiation treatments, with planned completion on 10/12/2017.   Past Medical History:  Diagnosis Date  . Bladder cancer ([place])   . Hypertension   . Lung cancer ([place]) 2016    Past Surgical History:  Procedure Laterality Date  . BLADDER SURGERY  08/12/2017  . BYPASS GRAFT FEMORAL/POPLITEAL W/VEIN  2009  . COLONOSCOPY  2013  . CYSTOSCOPY  08/12/2017  . IR ABDOMINAL AORTOBIFEMORAL CATHETER SERIALOGRAM  2009  . LOBECTOMY Right 09/2015  . PACEMAKER INSERTION  2003    Family History  Problem Relation Age of Onset  . Cancer Mother   . Cancer Sister   . Cancer Maternal Grandmother     Social History:  reports that he has  been smoking cigarettes.  He has a 55.00 pack-year smoking history. he has never used smokeless tobacco. He reports that he drinks alcohol. He reports that he does not use drugs.  He smokes 1 pack a day for > 55 years.  He used to drink 4 alcoholic beverages/day.  Now his alcohol intake is infrequent. 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 There is the potential of past exposure to radiation and/or toxins. He is registered with the [place] regarding his exposures.  He lives in [place].  The patient is accompanied by his girlfriend Optometrist) today.  Allergies:  Allergies  Allergen Reactions  . Cefazolin Rash and Shortness Of Breath  . Carvedilol     Other reaction(s): Unknown  . Chlorthalidone     Other reaction(s): Unknown  . Hydralazine     Other reaction(s): Unknown    Current Medications: Current Outpatient Medications  Medication Sig Dispense Refill  . aspirin 81 MG chewable tablet Chew 81 mg daily by mouth.    . clopidogrel (PLAVIX) 75 MG tablet Take 75 mg daily by mouth.    . docusate sodium (COLACE) 100 MG capsule Take 100 mg 2 (two) times daily by mouth.    Marland Kitchen ibuprofen (ADVIL,MOTRIN) 200 MG tablet Take 400 mg every 6 (six) hours as needed by mouth.    Marland Kitchen lisinopril (PRINIVIL,ZESTRIL) 20 MG tablet Take 20 mg daily by mouth.    . mirtazapine (REMERON) 15 MG tablet Take 15 mg at bedtime by mouth.    . morphine (MSIR)  15 MG tablet Take 15 mg by mouth every 12 (twelve) hours as needed for moderate pain or severe pain.     Marland Kitchen oxycodone (OXY-IR) 5 MG capsule Take 1 capsule (5 mg total) by mouth every 4 (four) hours as needed for pain. 30 capsule 0   No current facility-administered medications for this visit.     Review of Systems:  GENERAL:  Feels "terrible".  No fever or sweats.  Weight loss of 10-15 pounds in the last 1-2 months; down 2 pounds since last visit. PERFORMANCE STATUS (ECOG):  2 HEENT:  No visual changes, sore throat,  mouth sores or tenderness. Lungs: Shortness of breath with exertion.  Cough.  No hemoptysis. Cardiac:  Implanted pacemaker (NOT MRI COMPATIBLE). No chest pain, palpitations, orthopnea, or PND.  Blood pressure fluctuates.  Off amlodipine. GI:  Constipation secondary to narcotics.  No nausea, vomiting, diarrhea, melena or hematochezia. GU:  Dribbling.  Urgency, frequency, dysuria and nocturia.  No hematuria. Musculoskeletal:  Neck pain.  Bone pain (lower ribs, shoulders, back, neck, shoulder).  Joint pain (left wrist and right elbow). No muscle tenderness. Extremities:  No pain or swelling. Skin:  No rashes or skin changes. Neuro:  Minor headache.  No numbness or weakness, balance or coordination issues. Endocrine:  No diabetes, thyroid issues, hot flashes or night sweats. Psych:  No mood changes, depression or anxiety. Pain:  Bone pain. Review of systems:  All other systems reviewed and found to be negative.  Physical Exam: Blood pressure 96/65, pulse (!) 112, temperature 97.9 F (36.6 C), temperature source Tympanic, weight 135 lb (61.2 kg). GENERAL:  Well developed, well nourished, gentleman sitting comfortably in the exam room in no acute distress. MENTAL STATUS:  Alert and oriented to person, place and time. HEAD:  Thin gray hair.  Normocephalic, atraumatic, face symmetric, no Cushingoid features. EYES:  Blue eyes.  Pupils equal round and reactive to light and accomodation.  No conjunctivitis or scleral icterus. ENT:  Oropharynx clear without lesion.  Tongue normal.  Upper dentures.  No lower teeth.  Mucous membranes moist.  NECK:  Well healed scar right sided scar. CHEST:  Left sided pacemaker. RESPIRATORY:  Clear to auscultation without rales, wheezes or rhonchi. CARDIOVASCULAR:  Regular rate and rhythm without murmur, rub or gallop. ABDOMEN:  Soft, non-tender, with active bowel sounds, and no hepatosplenomegaly.  No masses. BACK:  Focal areas of pain in lower thoracic spine and  L2-L3. SKIN:  No rashes, ulcers or lesions. EXTREMITIES: Nicotine stained nails.  No edema, no skin discoloration or tenderness.  No palpable cords. LYMPH NODES: No palpable cervical, supraclavicular, axillary or inguinal adenopathy  NEUROLOGICAL: Unremarkable. PSYCH:  Appropriate.   Appointment on 10/06/2017  Component Date Value Ref Range Status  . LDH 10/06/2017 156  98 - 192 U/L Final  . Magnesium 10/06/2017 2.5* 1.7 - 2.4 mg/dL Final  . Sodium 10/06/2017 126* 135 - 145 mmol/L Final  . Potassium 10/06/2017 3.6  3.5 - 5.1 mmol/L Final  . Chloride 10/06/2017 93* 101 - 111 mmol/L Final  . CO2 10/06/2017 22  22 - 32 mmol/L Final  . Glucose, Bld 10/06/2017 109* 65 - 99 mg/dL Final  . BUN 10/06/2017 10  6 - 20 mg/dL Final  . Creatinine, Ser 10/06/2017 0.72  0.61 - 1.24 mg/dL Final  . Calcium 10/06/2017 7.3* 8.9 - 10.3 mg/dL Final  . Total Protein 10/06/2017 6.9  6.5 - 8.1 g/dL Final  . Albumin 10/06/2017 3.0* 3.5 - 5.0 g/dL Final  .  AST 10/06/2017 17  15 - 41 U/L Final  . ALT 10/06/2017 14* 17 - 63 U/L Final  . Alkaline Phosphatase 10/06/2017 126  38 - 126 U/L Final  . Total Bilirubin 10/06/2017 0.7  0.3 - 1.2 mg/dL Final  . GFR calc non Af Amer 10/06/2017 >60  >60 mL/min Final  . GFR calc Af Amer 10/06/2017 >60  >60 mL/min Final   Comment: (NOTE) The eGFR has been calculated using the CKD EPI equation. This calculation has not been validated in all clinical situations. eGFR's persistently <60 mL/min signify possible Chronic Kidney Disease.   . Anion gap 10/06/2017 11  5 - 15 Final  . WBC 10/06/2017 9.0  3.8 - 10.6 K/uL Final  . RBC 10/06/2017 4.46  4.40 - 5.90 MIL/uL Final  . Hemoglobin 10/06/2017 13.3  13.0 - 18.0 g/dL Final  . HCT 10/06/2017 38.6* 40.0 - 52.0 % Final  . MCV 10/06/2017 86.5  80.0 - 100.0 fL Final  . MCH 10/06/2017 29.9  26.0 - 34.0 pg Final  . MCHC 10/06/2017 34.5  32.0 - 36.0 g/dL Final  . RDW 10/06/2017 13.7  11.5 - 14.5 % Final  . Platelets 10/06/2017  537* 150 - 440 K/uL Final  . Neutrophils Relative % 10/06/2017 77  % Final  . Neutro Abs 10/06/2017 6.9* 1.4 - 6.5 K/uL Final  . Lymphocytes Relative 10/06/2017 12  % Final  . Lymphs Abs 10/06/2017 1.1  1.0 - 3.6 K/uL Final  . Monocytes Relative 10/06/2017 8  % Final  . Monocytes Absolute 10/06/2017 0.7  0.2 - 1.0 K/uL Final  . Eosinophils Relative 10/06/2017 2  % Final  . Eosinophils Absolute 10/06/2017 0.1  0 - 0.7 K/uL Final  . Basophils Relative 10/06/2017 1  % Final  . Basophils Absolute 10/06/2017 0.1  0 - 0.1 K/uL Final    Assessment:  Leman Martinek is a 71 y.o. adult with metastatic disease.  He has a recent history of muscle invasive bladder cancer and a distant history of lung cancer.  He has a history of stage IB lung cancer status post right upper lobe lobectomy in 09/2015.  Pathology revealed a T2aN0M0 adenocarcinoma.  Chest CT on 05/25/2017 revealed postoperative changes from a right upper lobe lobectomy without evidence of recurrence. There was new diffuse punctate sclerotic osseous lesions throughout the chest concerning for metastatic disease.  PET scan on 06/08/2017 at the [facility] revealed right upper lobe lobectomy without evidence of recurrence. There was a new 3.2 cm mass along the right inferior bladder wall, anterior to the ureteral vesicular junction.  There were multiple new focal sclerotic and some probable mixed lytic and sclerotic osseous lesions in the axial skeleton many of which demonstrated increased FDG avidity. Differential diagnoses included include metastatic lung cancer, bladder cancer or prostate cancer. There was increased FDG uptake in the left medial thigh musculature of uncertain etiology.  PET scan on 09/09/2017 revealed several hypermetabolic mixed lytic and sclerotic osseous metastases throughout the axial skeleton.  There was suspected intramuscular soft tissue metastases in the right quadratus lumborum and posterior left lumbar paraspinal  musculature.  There was mildly hypermetabolic retroperitoneal lymphadenopathy, probably representing nodal metastases.  There was diffuse irregular bladder wall thickening, asymmetrically prominent in the right bladder wall, suspicious for residual/recurrent bladder neoplasm.   There was nonspecific small ground-glass nodular opacity in the left upper lung lobe with low level metabolism.  There was no evidence of local tumor recurrence in the right lung status post  right upper lobectomy   He underwent transurethral resection of bladder tumor (TURBT) with bilateral retrograde pyelogram on 08/12/2017.  He had a 3 cm bladder tumor on the right lateral wall with some sessile and papillary features.  Retrograde pyelograms were normal without filling defect.  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  He began monthly Xgeva on 09/16/2017.  He has a history of peripheral vascular disease s/p fem-fem bypass in 2016. He is on Plavix.  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  Colonoscopy was normal in 2011.  Symptomatically, he is fatigued.  He has urinary difficulties. He is lightheaded and dizzy.  He has a chronic cough and shortness of breath s/p lung resection. He continues to smoke on a daily basis. He has opioid induced constipation.  Exam reveals dehydration and orthostatic changes.  Plan: 1.  Labs today:  CBC with diff, CMP, LDH. 2.  Discuss treatment for bladder cancer. Discuss platinum based chemotherapy (carboplatin versus cisplatin + gemcitabine).  Patient is not a candidate for bladder resection secondary to metastatic disease.  Side effects of medications reviewed. Discuss need for port-a-cath placement and chemotherapy education class. In the absence of treatment, patient has an approximate 6 month prognosis.   3.  Discuss pain management. Patient asked to maintain a pain diary. Discuss using long acting medication  (MS-Contin) first, with Oxycodone IR being used for breakthrough.  4.  Discuss referral to vascular ([place]) for central venous access placement. Scheduled to have port placed on 10/13/2017. 5.  Discuss weight loss and poor appetite. Will try appetite stimulation.  Rx: Megace 20m/mL - 563mdaily 6.  Discuss HYPOtension and vertiginous symptoms. Amlodipine discontinied last visit. He is still on Lisinopril 10 mg. Patient asked discontinue Lisinopril. Sodium low at 126. Will give patient 1L NS today. He will return on 10/07/2017 for 1L NS.  7.  RTC on 10/10/2017 for labs ( BMP) 8.  RTC on 10/14/2017 for labs (BMP) and Xgeva.  9.  Calcium low at 7.3. He is not taking calcium supplementation and is on Xgeva. Patient encouraged to start Calcium 1200 mg and Vitamin D 800 IU daily. 10.  RTC on 10/27/2017 for MD assessment, labs (CBC with diff, CMP, Mg, LDH), day 1 of cycle #1 cisplatin + gemcitabine (new).  11.  RTC on 11/03/2017 for MD assessment, labs (CBC with diff, CMP, Mg, LDH), day 8 of cycle #1 gemcitabine.    BrHonor LohNP  10/06/2017, 9:20 AM   I saw and evaluated the patient, participating in the key portions of the service and reviewing pertinent diagnostic studies and records.  I reviewed the nurse practitioner's note and agree with the findings and the plan.  The assessment and plan were discussed with the patient.  Multiple questions were asked by the patient and answered.   MeNolon StallsMD 10/06/2017, 9:20 AM

## 2017-10-06 NOTE — Patient Instructions (Signed)
Start taking calcium 1200 mg and vitamin D 800 IU everyday.

## 2017-10-06 NOTE — Progress Notes (Signed)
Patient here for follow up with labs today. He states that his mouth stays dry all the time. He continues to have bony pain, but states that it is fairly well controlled with the pain medications. His blood pressure is very low despite stopping the norvasc. He is still taking the lisinopril. He is very light headed and dizzy. He wife states that she does not think he is eating well and patient states that he has little appetite.

## 2017-10-07 ENCOUNTER — Ambulatory Visit
Admission: RE | Admit: 2017-10-07 | Discharge: 2017-10-07 | Disposition: A | Discharge status: Home / Self Care | Payer: Non-veteran care | Source: Ambulatory Visit | Attending: Radiation Oncology | Admitting: Radiation Oncology

## 2017-10-07 ENCOUNTER — Inpatient Hospital Stay: Payer: Non-veteran care

## 2017-10-07 VITALS — BP 124/81 | HR 86 | Temp 98.0°F | Resp 18

## 2017-10-07 DIAGNOSIS — C7951 Secondary malignant neoplasm of bone: Secondary | ICD-10-CM

## 2017-10-07 DIAGNOSIS — C679 Malignant neoplasm of bladder, unspecified: Secondary | ICD-10-CM | POA: Diagnosis not present

## 2017-10-07 MED ORDER — SODIUM CHLORIDE 0.9 % IV SOLN
Freq: Once | INTRAVENOUS | Status: AC
  Administered 2017-10-07: 10:00:00 via INTRAVENOUS
  Filled 2017-10-07: qty 1000

## 2017-10-07 MED ORDER — SODIUM CHLORIDE 0.9 % IV SOLN: Freq: Once | INTRAVENOUS | Status: DC

## 2017-10-07 MED ORDER — ONDANSETRON HCL 4 MG/2ML IJ SOLN
8.0000 mg | Freq: Once | INTRAMUSCULAR | Status: AC
  Administered 2017-10-07: 8 mg via INTRAVENOUS
  Filled 2017-10-07: qty 4

## 2017-10-07 NOTE — Progress Notes (Signed)
Patient states, "I haven't had a good bowel movement in a week. I am taking stool softeners and I have taken Miralax and it isn't helping." NP, Honor Loh, notified via telephone and aware. Per NP order: patient instructed to drink prune juice and start taking Senna-S and Miralax per labeled directions over the weekend. If no relief or if constipation symptoms become worse, patient instructed to go to the Emergency Department.

## 2017-10-08 ENCOUNTER — Other Ambulatory Visit: Payer: Self-pay

## 2017-10-08 ENCOUNTER — Emergency Department
Admission: EM | Admit: 2017-10-08 | Discharge: 2017-10-08 | Disposition: A | Discharge status: Home / Self Care | Payer: Medicare Other | Attending: Emergency Medicine | Admitting: Emergency Medicine

## 2017-10-08 DIAGNOSIS — Z950 Presence of cardiac pacemaker: Secondary | ICD-10-CM | POA: Insufficient documentation

## 2017-10-08 DIAGNOSIS — Z7902 Long term (current) use of antithrombotics/antiplatelets: Secondary | ICD-10-CM | POA: Diagnosis not present

## 2017-10-08 DIAGNOSIS — F17210 Nicotine dependence, cigarettes, uncomplicated: Secondary | ICD-10-CM | POA: Insufficient documentation

## 2017-10-08 DIAGNOSIS — I10 Essential (primary) hypertension: Secondary | ICD-10-CM | POA: Insufficient documentation

## 2017-10-08 DIAGNOSIS — Z7982 Long term (current) use of aspirin: Secondary | ICD-10-CM | POA: Insufficient documentation

## 2017-10-08 DIAGNOSIS — Z79899 Other long term (current) drug therapy: Secondary | ICD-10-CM | POA: Diagnosis not present

## 2017-10-08 DIAGNOSIS — K5900 Constipation, unspecified: Secondary | ICD-10-CM

## 2017-10-08 MED ORDER — DOCUSATE SODIUM 50 MG/5ML PO LIQD
100.0000 mg | Freq: Once | ORAL | Status: AC
  Administered 2017-10-08: 100 mg
  Filled 2017-10-08: qty 10

## 2017-10-08 MED ORDER — MAGNESIUM CITRATE PO SOLN
0.5000 | Freq: Once | ORAL | Status: AC
  Administered 2017-10-08: 0.5
  Filled 2017-10-08: qty 296

## 2017-10-08 MED ORDER — LACTULOSE 10 GM/15ML PO SOLN
10.0000 g | Freq: Once | ORAL | Status: AC
  Administered 2017-10-08: 10 g via ORAL
  Filled 2017-10-08: qty 30

## 2017-10-08 NOTE — ED Notes (Signed)
Enema successful

## 2017-10-08 NOTE — ED Triage Notes (Signed)
He arrives today with reports of constipation  He verbalizes that it has been more than two weeks since he had a normal BM - "hard balls" is all he has been able to pass    Currently being treated for bone CA and the "narcotics have me all stopped up."

## 2017-10-08 NOTE — ED Provider Notes (Signed)
[facility] Emergency Department Provider Note  ____________________________________________   First MD Initiated Contact with Patient 10/08/17 1617     (approximate)  I have reviewed the triage vital signs and the nursing notes.   HISTORY  Chief Complaint Constipation   HPI Patrick Hooper is a 71 y.o. adult history of bladder cancer with bone metastases on morphine at home for pain control who is presenting to the emergency department today with 2 weeks worth of constipation.  He says that he is only passed small pellets over the past 2 weeks and this was with the aid of glycerin per rectum.  He says that he has been taking senna as well as docusate.  Received fluids at oncology yesterday and has had no success moving his bowels.  He is denying any abdominal pain, nausea or vomiting but says that he has lost his appetite.  He is still able to pass gas.  Presenting to the emergency department today for more aggressive treatment at the request of his oncologist.  He is seen by oncology here at [facility] for radiation but also at the [facility].    Past Medical History:  Diagnosis Date  . Bladder cancer ([place])   . Hypertension   . Lung cancer [facility]) 2016    Patient Active Problem List   Diagnosis Date Noted  . Neoplasm related pain 09/27/2017  . Urothelial carcinoma of bladder ([place]) 09/14/2017  . Goals of care, counseling/discussion 09/12/2017  . Malignant neoplasm of lateral wall of urinary bladder ([place]) 09/07/2017  . Malignant neoplasm of upper lobe of right lung ([place]) 09/07/2017  . Bone metastasis ([place]) 09/07/2017    Past Surgical History:  Procedure Laterality Date  . BLADDER SURGERY  08/12/2017  . BYPASS GRAFT FEMORAL/POPLITEAL W/VEIN  2009  . COLONOSCOPY  2013  . CYSTOSCOPY  08/12/2017  . IR ABDOMINAL AORTOBIFEMORAL CATHETER SERIALOGRAM  2009  . LOBECTOMY Right 09/2015  . PACEMAKER INSERTION  2003    Prior to Admission medications     Medication Sig Start Date End Date Taking? Authorizing Provider  aspirin 81 MG chewable tablet Chew 81 mg daily by mouth.    [provider]  clopidogrel (PLAVIX) 75 MG tablet Take 75 mg daily by mouth.    [provider]  docusate sodium (COLACE) 100 MG capsule Take 100 mg 2 (two) times daily by mouth.    [provider]  ibuprofen (ADVIL,MOTRIN) 200 MG tablet Take 400 mg every 6 (six) hours as needed by mouth.    [provider]  lisinopril (PRINIVIL,ZESTRIL) 20 MG tablet Take 20 mg daily by mouth.    [provider]  megestrol (MEGACE ORAL) 40 MG/ML suspension Take 5 mLs (200 mg total) by mouth daily. 10/06/17   [name], NP  mirtazapine (REMERON) 15 MG tablet Take 15 mg at bedtime by mouth.    [provider]  morphine (MSIR) 15 MG tablet Take 15 mg by mouth every 12 (twelve) hours as needed for moderate pain or severe pain.     [provider]  oxycodone (OXY-IR) 5 MG capsule Take 1 capsule (5 mg total) by mouth every 4 (four) hours as needed for pain. 09/27/17   [name], NP    Allergies Cefazolin; Carvedilol; Chlorthalidone; Hydralazine; and Hydrochlorothiazide  Family History  Problem Relation Age of Onset  . Cancer Mother   . Cancer Sister   . Cancer Maternal Grandmother     Social History Social History  Tobacco Use  . Smoking status: Current Every Day Smoker    Packs/day: 1.00    Years: 55.00    Pack years: 55.00    Types: Cigarettes  . Smokeless tobacco: Never Used  Substance Use Topics  . Alcohol use: Yes    Comment: Occasional beer  . Drug use: No    Review of Systems  Constitutional: No fever/chills Eyes: No visual changes. ENT: No sore throat. Cardiovascular: Denies chest pain. Respiratory: Denies shortness of breath. Gastrointestinal: No abdominal pain.  No nausea, no vomiting.  No diarrhea.   Genitourinary: Negative for dysuria. Musculoskeletal: Negative for back pain. Skin:  Negative for rash. Neurological: Negative for headaches, focal weakness or numbness.   ____________________________________________   PHYSICAL EXAM:  VITAL SIGNS: ED Triage Vitals  Enc Vitals Group     BP 10/08/17 1514 123/76     Pulse Rate 10/08/17 1514 84     Resp 10/08/17 1514 17     Temp 10/08/17 1514 97.9 F (36.6 C)     Temp Source 10/08/17 1514 Oral     SpO2 10/08/17 1514 98 %     Weight 10/08/17 1515 135 lb (61.2 kg)     Height 10/08/17 1515 5\' 11"  (1.803 m)     Head Circumference --      Peak Flow --      Pain Score 10/08/17 1513 4     Pain Loc --      Pain Edu? --      Excl. in [place]? --     Constitutional: Alert and oriented. Well appearing and in no acute distress. Eyes: Conjunctivae are normal.  Head: Atraumatic. Nose: No congestion/rhinnorhea. Mouth/Throat: Mucous membranes are moist.  Neck: No stridor.   Cardiovascular: Normal rate, regular rhythm. Grossly normal heart sounds.  Respiratory: Normal respiratory effort.  No retractions Gastrointestinal: Soft and nontender. No distention. Rectal exam without fecal impaction.  Grossly brown stool on the glove.    Musculoskeletal: No lower extremity tenderness nor edema.  No joint effusions. Neurologic:  Normal speech and language. No gross focal neurologic deficits are appreciated. Skin:  Skin is warm, dry and intact. No rash noted. Psychiatric: Mood and affect are normal. Speech and behavior are normal.  ____________________________________________   LABS (all labs ordered are listed, but only abnormal results are displayed)  Labs Reviewed - No data to display ____________________________________________  EKG   ____________________________________________  RADIOLOGY   ____________________________________________   PROCEDURES  Procedure(s) performed:   Procedures  Critical Care performed:   ____________________________________________   INITIAL IMPRESSION / ASSESSMENT AND PLAN / ED  COURSE  Pertinent labs & imaging results that were available during my care of the patient were reviewed by me and considered in my medical decision making (see chart for details).  DDX:  Constipation, sbo, lbo, metastatic disease to colon  As part of my medical decision making, I reviewed the following data within the [place] chart reviewed    ----------------------------------------- 6:29 PM on 10/08/2017 -----------------------------------------  Patient able to move his bowels and says that he feels "100% better."  We discussed increasing the fiber content of his diet including fiber bars and fiber cereals.  We also discussed over-the-counter laxatives such as magnesium citrate as well as MiraLAX.  He will be following up with his oncologist.  Patient will be discharged at this time.  ____________________________________________   FINAL CLINICAL IMPRESSION(S) / ED DIAGNOSES  Constipation.    NEW MEDICATIONS STARTED DURING THIS VISIT:  This  SmartLink is deprecated. Use AVSMEDLIST instead to display the medication list for a patient.   Note:  This document was prepared using Dragon voice recognition software and may include unintentional dictation errors.     Orbie Pyo, MD 10/08/17 [id number]

## 2017-10-10 ENCOUNTER — Ambulatory Visit: Payer: Non-veteran care

## 2017-10-10 ENCOUNTER — Inpatient Hospital Stay: Payer: Non-veteran care

## 2017-10-11 ENCOUNTER — Ambulatory Visit: Payer: Non-veteran care

## 2017-10-11 LAB — SLIDE CONSULT, PATHOLOGY ARMC

## 2017-10-12 ENCOUNTER — Telehealth: Payer: Self-pay | Admitting: *Deleted

## 2017-10-12 ENCOUNTER — Inpatient Hospital Stay
Admission: EM | Admit: 2017-10-12 | Discharge: 2017-10-14 | DRG: 641 | Disposition: A | Discharge status: Home Health | Payer: Medicare Other | Attending: Internal Medicine | Admitting: Internal Medicine

## 2017-10-12 ENCOUNTER — Other Ambulatory Visit: Payer: Self-pay

## 2017-10-12 ENCOUNTER — Ambulatory Visit: Payer: Non-veteran care

## 2017-10-12 ENCOUNTER — Emergency Department: Payer: Medicare Other

## 2017-10-12 ENCOUNTER — Inpatient Hospital Stay (HOSPITAL_BASED_OUTPATIENT_CLINIC_OR_DEPARTMENT_OTHER): Payer: Non-veteran care | Admitting: Oncology

## 2017-10-12 ENCOUNTER — Encounter: Payer: Self-pay | Admitting: Emergency Medicine

## 2017-10-12 VITALS — BP 114/77 | HR 124 | Temp 97.3°F | Resp 18 | Wt 127.1 lb

## 2017-10-12 DIAGNOSIS — T402X5A Adverse effect of other opioids, initial encounter: Secondary | ICD-10-CM

## 2017-10-12 DIAGNOSIS — I959 Hypotension, unspecified: Secondary | ICD-10-CM

## 2017-10-12 DIAGNOSIS — Z923 Personal history of irradiation: Secondary | ICD-10-CM

## 2017-10-12 DIAGNOSIS — K5903 Drug induced constipation: Secondary | ICD-10-CM | POA: Diagnosis present

## 2017-10-12 DIAGNOSIS — Z809 Family history of malignant neoplasm, unspecified: Secondary | ICD-10-CM

## 2017-10-12 DIAGNOSIS — R112 Nausea with vomiting, unspecified: Secondary | ICD-10-CM

## 2017-10-12 DIAGNOSIS — E860 Dehydration: Secondary | ICD-10-CM

## 2017-10-12 DIAGNOSIS — M899 Disorder of bone, unspecified: Secondary | ICD-10-CM | POA: Diagnosis not present

## 2017-10-12 DIAGNOSIS — I10 Essential (primary) hypertension: Secondary | ICD-10-CM

## 2017-10-12 DIAGNOSIS — F17210 Nicotine dependence, cigarettes, uncomplicated: Secondary | ICD-10-CM

## 2017-10-12 DIAGNOSIS — R05 Cough: Secondary | ICD-10-CM | POA: Diagnosis not present

## 2017-10-12 DIAGNOSIS — E872 Acidosis, unspecified: Secondary | ICD-10-CM

## 2017-10-12 DIAGNOSIS — R000 Tachycardia, unspecified: Secondary | ICD-10-CM | POA: Diagnosis not present

## 2017-10-12 DIAGNOSIS — R531 Weakness: Secondary | ICD-10-CM

## 2017-10-12 DIAGNOSIS — E440 Moderate protein-calorie malnutrition: Secondary | ICD-10-CM | POA: Diagnosis present

## 2017-10-12 DIAGNOSIS — I951 Orthostatic hypotension: Secondary | ICD-10-CM

## 2017-10-12 DIAGNOSIS — R0602 Shortness of breath: Secondary | ICD-10-CM

## 2017-10-12 DIAGNOSIS — Z85118 Personal history of other malignant neoplasm of bronchus and lung: Secondary | ICD-10-CM

## 2017-10-12 DIAGNOSIS — M542 Cervicalgia: Secondary | ICD-10-CM

## 2017-10-12 DIAGNOSIS — R42 Dizziness and giddiness: Secondary | ICD-10-CM

## 2017-10-12 DIAGNOSIS — E871 Hypo-osmolality and hyponatremia: Secondary | ICD-10-CM | POA: Diagnosis present

## 2017-10-12 DIAGNOSIS — C679 Malignant neoplasm of bladder, unspecified: Secondary | ICD-10-CM | POA: Diagnosis not present

## 2017-10-12 DIAGNOSIS — C7951 Secondary malignant neoplasm of bone: Secondary | ICD-10-CM | POA: Diagnosis present

## 2017-10-12 DIAGNOSIS — Z79899 Other long term (current) drug therapy: Secondary | ICD-10-CM

## 2017-10-12 DIAGNOSIS — I739 Peripheral vascular disease, unspecified: Secondary | ICD-10-CM | POA: Diagnosis present

## 2017-10-12 DIAGNOSIS — Z950 Presence of cardiac pacemaker: Secondary | ICD-10-CM

## 2017-10-12 DIAGNOSIS — Z7982 Long term (current) use of aspirin: Secondary | ICD-10-CM

## 2017-10-12 DIAGNOSIS — E876 Hypokalemia: Secondary | ICD-10-CM | POA: Diagnosis present

## 2017-10-12 DIAGNOSIS — R5383 Other fatigue: Secondary | ICD-10-CM | POA: Diagnosis not present

## 2017-10-12 DIAGNOSIS — Z7902 Long term (current) use of antithrombotics/antiplatelets: Secondary | ICD-10-CM

## 2017-10-12 DIAGNOSIS — R630 Anorexia: Secondary | ICD-10-CM

## 2017-10-12 DIAGNOSIS — Z681 Body mass index (BMI) 19 or less, adult: Secondary | ICD-10-CM

## 2017-10-12 DIAGNOSIS — R634 Abnormal weight loss: Secondary | ICD-10-CM

## 2017-10-12 DIAGNOSIS — L8990 Pressure ulcer of unspecified site, unspecified stage: Secondary | ICD-10-CM

## 2017-10-12 DIAGNOSIS — Z902 Acquired absence of lung [part of]: Secondary | ICD-10-CM

## 2017-10-12 HISTORY — DX: Peripheral vascular disease, unspecified: I73.9

## 2017-10-12 LAB — COMPREHENSIVE METABOLIC PANEL
ALT: 11 U/L — AB (ref 17–63)
ANION GAP: 11 (ref 5–15)
AST: 21 U/L (ref 15–41)
Albumin: 3.2 g/dL — ABNORMAL LOW (ref 3.5–5.0)
Alkaline Phosphatase: 140 U/L — ABNORMAL HIGH (ref 38–126)
BUN: 9 mg/dL (ref 6–20)
CHLORIDE: 95 mmol/L — AB (ref 101–111)
CO2: 20 mmol/L — AB (ref 22–32)
CREATININE: 0.67 mg/dL (ref 0.61–1.24)
Calcium: 7.6 mg/dL — ABNORMAL LOW (ref 8.9–10.3)
Glucose, Bld: 130 mg/dL — ABNORMAL HIGH (ref 65–99)
POTASSIUM: 3.8 mmol/L (ref 3.5–5.1)
SODIUM: 126 mmol/L — AB (ref 135–145)
Total Bilirubin: 0.8 mg/dL (ref 0.3–1.2)
Total Protein: 7.7 g/dL (ref 6.5–8.1)

## 2017-10-12 LAB — CBC WITH DIFFERENTIAL/PLATELET
BASOS PCT: 1 %
Basophils Absolute: 0.1 10*3/uL (ref 0–0.1)
EOS ABS: 0 10*3/uL (ref 0–0.7)
Eosinophils Relative: 0 %
HEMATOCRIT: 44 % (ref 40.0–52.0)
HEMOGLOBIN: 15.1 g/dL (ref 13.0–18.0)
LYMPHS ABS: 0.6 10*3/uL — AB (ref 1.0–3.6)
Lymphocytes Relative: 7 %
MCH: 29.5 pg (ref 26.0–34.0)
MCHC: 34.4 g/dL (ref 32.0–36.0)
MCV: 85.8 fL (ref 80.0–100.0)
Monocytes Absolute: 0.9 10*3/uL (ref 0.2–1.0)
Monocytes Relative: 10 %
NEUTROS ABS: 7.6 10*3/uL — AB (ref 1.4–6.5)
NEUTROS PCT: 82 %
Platelets: 480 10*3/uL — ABNORMAL HIGH (ref 150–440)
RBC: 5.14 MIL/uL (ref 4.40–5.90)
RDW: 14.5 % (ref 11.5–14.5)
WBC: 9.2 10*3/uL (ref 3.8–10.6)

## 2017-10-12 LAB — LACTIC ACID, PLASMA
LACTIC ACID, VENOUS: 2 mmol/L — AB (ref 0.5–1.9)
Lactic Acid, Venous: 0.9 mmol/L (ref 0.5–1.9)

## 2017-10-12 MED ORDER — SODIUM CHLORIDE 0.9 % IV BOLUS (SEPSIS)
1000.0000 mL | Freq: Once | INTRAVENOUS | Status: AC
  Administered 2017-10-12: 1000 mL via INTRAVENOUS

## 2017-10-12 MED ORDER — SODIUM CHLORIDE 0.9 % IV SOLN
INTRAVENOUS | Status: DC
Start: 2017-10-13 — End: 2017-10-13

## 2017-10-12 MED ORDER — DOCUSATE SODIUM 100 MG PO CAPS: 100.0000 mg | ORAL_CAPSULE | Freq: Two times a day (BID) | ORAL | Status: DC

## 2017-10-12 MED ORDER — ONDANSETRON HCL 4 MG/2ML IJ SOLN
4.0000 mg | Freq: Four times a day (QID) | INTRAMUSCULAR | Status: DC | PRN
  Administered 2017-10-13: 04:00:00 4 mg via INTRAVENOUS
  Filled 2017-10-12: qty 2

## 2017-10-12 MED ORDER — IBUPROFEN 400 MG PO TABS: 400.0000 mg | ORAL_TABLET | Freq: Four times a day (QID) | ORAL | Status: DC | PRN

## 2017-10-12 MED ORDER — FENTANYL CITRATE (PF) 100 MCG/2ML IJ SOLN
50.0000 ug | INTRAMUSCULAR | Status: DC | PRN
  Administered 2017-10-12: 50 ug via INTRAVENOUS
  Filled 2017-10-12: qty 2

## 2017-10-12 MED ORDER — OXYCODONE HCL 5 MG PO TABS
5.0000 mg | ORAL_TABLET | ORAL | Status: DC | PRN
  Administered 2017-10-13 – 2017-10-14 (×6): 5 mg via ORAL
  Filled 2017-10-12 (×6): qty 1

## 2017-10-12 MED ORDER — SODIUM CHLORIDE 0.9 % IV SOLN
Freq: Once | INTRAVENOUS | Status: AC
  Administered 2017-10-12: 21:00:00 via INTRAVENOUS

## 2017-10-12 MED ORDER — NICOTINE 21 MG/24HR TD PT24
21.0000 mg | MEDICATED_PATCH | Freq: Once | TRANSDERMAL | Status: AC
  Administered 2017-10-12: 21 mg via TRANSDERMAL
  Filled 2017-10-12: qty 1

## 2017-10-12 MED ORDER — DOCUSATE SODIUM 100 MG PO CAPS
100.0000 mg | ORAL_CAPSULE | Freq: Two times a day (BID) | ORAL | Status: DC
  Administered 2017-10-13 – 2017-10-14 (×4): 100 mg via ORAL
  Filled 2017-10-12 (×4): qty 1

## 2017-10-12 MED ORDER — PROMETHAZINE HCL 25 MG/ML IJ SOLN
12.5000 mg | Freq: Four times a day (QID) | INTRAMUSCULAR | Status: DC | PRN
  Administered 2017-10-12: 12.5 mg via INTRAVENOUS
  Filled 2017-10-12: qty 1

## 2017-10-12 MED ORDER — MEGESTROL ACETATE 40 MG/ML PO SUSP
200.0000 mg | Freq: Every day | ORAL | Status: DC
  Administered 2017-10-13 – 2017-10-14 (×2): 200 mg via ORAL
  Filled 2017-10-12 (×2): qty 5

## 2017-10-12 MED ORDER — MIRTAZAPINE 15 MG PO TABS
15.0000 mg | ORAL_TABLET | Freq: Every day | ORAL | Status: DC
  Administered 2017-10-13 (×2): 15 mg via ORAL
  Filled 2017-10-12 (×2): qty 1

## 2017-10-12 MED ORDER — ONDANSETRON HCL 4 MG PO TABS: 4.0000 mg | ORAL_TABLET | Freq: Four times a day (QID) | ORAL | Status: DC | PRN

## 2017-10-12 MED ORDER — ASPIRIN 81 MG PO CHEW
81.0000 mg | CHEWABLE_TABLET | Freq: Every day | ORAL | Status: DC
  Administered 2017-10-13 – 2017-10-14 (×2): 81 mg via ORAL
  Filled 2017-10-12 (×2): qty 1

## 2017-10-12 MED ORDER — ENOXAPARIN SODIUM 40 MG/0.4ML ~~LOC~~ SOLN
40.0000 mg | SUBCUTANEOUS | Status: DC
  Filled 2017-10-12: qty 0.4

## 2017-10-12 MED ORDER — CLOPIDOGREL BISULFATE 75 MG PO TABS
75.0000 mg | ORAL_TABLET | Freq: Every day | ORAL | Status: DC
  Administered 2017-10-13 – 2017-10-14 (×2): 75 mg via ORAL
  Filled 2017-10-12 (×2): qty 1

## 2017-10-12 NOTE — Progress Notes (Signed)
Patient here today for acute add on regarding weakness. Patients wife reports worsening weakness over the past 2 days. Patient not eating or drinking, wife reports he is unable to take a shower or do his normal daily activities.

## 2017-10-12 NOTE — Telephone Encounter (Signed)
S/O called asking for Palliative Care services. Please advise if in agreement and will sign orders and fax referral.

## 2017-10-12 NOTE — H&P (Signed)
[facility] NAME: Patrick Hooper    MR#:  [id number]  DATE OF BIRTH:  [birth year]  DATE OF ADMISSION:  10/12/2017  PRIMARY CARE PHYSICIAN: [facility] [place]   REQUESTING/REFERRING PHYSICIAN: Merlyn Lot, MD  CHIEF COMPLAINT:   Chief Complaint  Patient presents with  . Weakness  . Dizziness    HISTORY OF PRESENT ILLNESS: Patrick Hooper  is a 71 y.o. adult with a known history of active bladder cancer, essential hypertension, previous history of lung cancer status post resection and peripheral vascular disease presenting with generalized weakness and dizziness ongoing for the past few weeks.  Patient states that he has no appetite and is not able to eat much because he does not feel like eating.  Patient's blood pressure was noted to be low at home therefore he decided to come to the ED.  In the ER he is noted to have low sodium however similar sodium was noted about 2 weeks ago. PAST MEDICAL HISTORY:   Past Medical History:  Diagnosis Date  . Bladder cancer ([place])   . Hypertension   . Lung cancer ([place]) 2016  . PVD (peripheral vascular disease) ([place])     PAST SURGICAL HISTORY:  Past Surgical History:  Procedure Laterality Date  . BLADDER SURGERY  08/12/2017  . BYPASS GRAFT FEMORAL/POPLITEAL W/VEIN  2009  . COLONOSCOPY  2013  . CYSTOSCOPY  08/12/2017  . IR ABDOMINAL AORTOBIFEMORAL CATHETER SERIALOGRAM  2009  . LOBECTOMY Right 09/2015  . PACEMAKER INSERTION  2003    SOCIAL HISTORY:  Social History   Tobacco Use  . Smoking status: Current Every Day Smoker    Packs/day: 1.00    Years: 55.00    Pack years: 55.00    Types: Cigarettes  . Smokeless tobacco: Never Used  Substance Use Topics  . Alcohol use: Yes    Comment: Occasional beer    FAMILY HISTORY:  Family History  Problem Relation Age of Onset  . Cancer Mother   . Cancer Sister   . Cancer Maternal Grandmother     DRUG ALLERGIES:  Allergies   Allergen Reactions  . Cefazolin Rash and Shortness Of Breath  . Carvedilol     Other reaction(s): Unknown  . Chlorthalidone     Other reaction(s): Unknown  . Hydralazine     Other reaction(s): Unknown  . Hydrochlorothiazide Other (See Comments)    REVIEW OF SYSTEMS:   CONSTITUTIONAL: No fever, positive fatigue or positive weakness.  EYES: No blurred or double vision.  EARS, NOSE, AND THROAT: No tinnitus or ear pain.  RESPIRATORY: No cough, shortness of breath, wheezing or hemoptysis.  CARDIOVASCULAR: No chest pain, orthopnea, edema.  GASTROINTESTINAL: No nausea, vomiting, diarrhea or abdominal pain.  GENITOURINARY: No dysuria, hematuria.  ENDOCRINE: No polyuria, nocturia,  HEMATOLOGY: No anemia, easy bruising or bleeding SKIN: No rash or lesion. MUSCULOSKELETAL: No joint pain or arthritis.   NEUROLOGIC: No tingling, numbness, weakness.  PSYCHIATRY: No anxiety or depression.   MEDICATIONS AT HOME:  Prior to Admission medications   Medication Sig Start Date End Date Taking? Authorizing Provider  aspirin 81 MG chewable tablet Chew 81 mg daily by mouth.   Yes [provider]  clopidogrel (PLAVIX) 75 MG tablet Take 75 mg daily by mouth.   Yes [provider]  docusate sodium (COLACE) 100 MG capsule Take 100 mg 2 (two) times daily by mouth.   Yes [provider]  ibuprofen (ADVIL,MOTRIN) 200 MG tablet  Take 400 mg every 6 (six) hours as needed by mouth.   Yes [provider]  mirtazapine (REMERON) 15 MG tablet Take 15 mg at bedtime by mouth.   Yes [provider]  morphine (MSIR) 15 MG tablet Take 15 mg by mouth every 12 (twelve) hours as needed for moderate pain or severe pain.    Yes [provider]  megestrol (MEGACE ORAL) 40 MG/ML suspension Take 5 mLs (200 mg total) by mouth daily. Patient not taking: Reported on 10/12/2017 10/06/17   [name], NP  oxycodone (OXY-IR) 5 MG capsule Take 1 capsule (5 mg total) by mouth every  4 (four) hours as needed for pain. Patient not taking: Reported on 10/12/2017 09/27/17   [name], NP      PHYSICAL EXAMINATION:   VITAL SIGNS: Blood pressure 101/72, pulse (!) 104, temperature 97.6 F (36.4 C), temperature source Oral, resp. rate 19, height 5\' 11"  (1.803 m), weight 127 lb (57.6 kg), SpO2 98 %.  GENERAL:  71 y.o.-year-old patient lying in the bed with no acute distress.  EYES: Pupils equal, round, reactive to light and accommodation. No scleral icterus. Extraocular muscles intact.  HEENT: Head atraumatic, normocephalic. Oropharynx and nasopharynx clear.  NECK:  Supple, no jugular venous distention. No thyroid enlargement, no tenderness.  LUNGS: Normal breath sounds bilaterally, no wheezing, rales,rhonchi or crepitation. No use of accessory muscles of respiration.  CARDIOVASCULAR: S1, S2 normal. No murmurs, rubs, or gallops.  ABDOMEN: Soft, nontender, nondistended. Bowel sounds present. No organomegaly or mass.  EXTREMITIES: No pedal edema, cyanosis, or clubbing.  NEUROLOGIC: Cranial nerves II through XII are intact. Muscle strength 5/5 in all extremities. Sensation intact. Gait not checked.  PSYCHIATRIC: The patient is alert and oriented x 3.  SKIN: No obvious rash, lesion, or ulcer.   LABORATORY PANEL:   CBC Recent Labs  Lab 10/06/17 0838 10/12/17 1724  WBC 9.0 9.2  HGB 13.3 15.1  HCT 38.6* 44.0  PLT 537* 480*  MCV 86.5 85.8  MCH 29.9 29.5  MCHC 34.5 34.4  RDW 13.7 14.5  LYMPHSABS 1.1 0.6*  MONOABS 0.7 0.9  EOSABS 0.1 0.0  BASOSABS 0.1 0.1   ------------------------------------------------------------------------------------------------------------------  Chemistries  Recent Labs  Lab 10/06/17 0838 10/12/17 1724  NA 126* 126*  K 3.6 3.8  CL 93* 95*  CO2 22 20*  GLUCOSE 109* 130*  BUN 10 9  CREATININE 0.72 0.67  CALCIUM 7.3* 7.6*  MG 2.5*  --   AST 17 21  ALT 14* 11*  ALKPHOS 126 140*  BILITOT 0.7 0.8    ------------------------------------------------------------------------------------------------------------------ estimated creatinine clearance is 59.5 mL/min for male patients and 70 mL/min for male patients (by C-G formula based on SCr of 0.67 mg/dL). ------------------------------------------------------------------------------------------------------------------ No results for input(s): TSH, T4TOTAL, T3FREE, THYROIDAB in the last 72 hours.  Invalid input(s): FREET3   Coagulation profile No results for input(s): INR, PROTIME in the last 168 hours. ------------------------------------------------------------------------------------------------------------------- No results for input(s): DDIMER in the last 72 hours. -------------------------------------------------------------------------------------------------------------------  Cardiac Enzymes No results for input(s): CKMB, TROPONINI, MYOGLOBIN in the last 168 hours.  Invalid input(s): CK ------------------------------------------------------------------------------------------------------------------ Invalid input(s): POCBNP  ---------------------------------------------------------------------------------------------------------------  Urinalysis    Component Value Date/Time   COLORURINE YELLOW 09/07/2017 1538   APPEARANCEUR Cloudy (A) 09/09/2017 1404   LABSPEC 1.025 09/07/2017 1538   PHURINE 5.5 09/07/2017 1538   GLUCOSEU Negative 09/09/2017 1404   HGBUR SMALL (A) 09/07/2017 1538   BILIRUBINUR Negative 09/09/2017 1404   KETONESUR NEGATIVE 09/07/2017 1538   PROTEINUR  1+ (A) 09/09/2017 1404   PROTEINUR 30 (A) 09/07/2017 1538   NITRITE Negative 09/09/2017 1404   NITRITE NEGATIVE 09/07/2017 1538   LEUKOCYTESUR 1+ (A) 09/09/2017 1404     RADIOLOGY: Dg Chest 2 View  Result Date: 10/12/2017 CLINICAL DATA:  71 year old male with history of bladder cancer and right upper lobectomy presents with overall weakness,  dizziness and hypotension. EXAM: CHEST  2 VIEW COMPARISON:  Head CT 09/09/2017 PE FINDINGS: Postoperative volume loss of the right lung status post right upper lobectomy. Emphysematous hyperinflation of the remaining lungs. No alveolar consolidation, effusion or pneumothorax. 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 Cardiac silhouette is normal in size. No aortic aneurysm. Subtle T8 and T9 vertebral body lucencies may reflect known changes of osseous metastasis. IMPRESSION: 1. Postop appearance of the right lung with volume loss and scarring status post right upper lobectomy. 2. Hyperinflated lungs without pneumonic consolidation or CHF. 3. Faint lucencies of the T8 and T9 vertebral bodies may reflect known changes of osseous metastatic disease better characterized on prior PET-CT. Electronically Signed   By: Ashley Royalty M.D.   On: 10/12/2017 18:08   Dg Abdomen Acute W/chest  Result Date: 10/12/2017 CLINICAL DATA:  Weakness, lightheadedness, hypotension and abdominal discomfort. EXAM: DG ABDOMEN ACUTE W/ 1V CHEST COMPARISON:  PET-CT 09/09/2017 FINDINGS: The abdominal gas pattern is negative for obstruction or perforation. No biliary or urinary calculi are evident. The upright view of the chest is negative for significant cardiopulmonary abnormality. IMPRESSION: Negative abdominal radiographs.  No acute cardiopulmonary disease. Electronically Signed   By: Andreas Newport M.D.   On: 10/12/2017 19:18    EKG: Orders placed or performed during the hospital encounter of 10/12/17  . ED EKG  . ED EKG    IMPRESSION AND PLAN: Patient is a 71 year old white male with bladder cancer with metastases now with generalized weakness  1.  Generalized weakness due to underlying malignancy as well as hyponatremia and dehydration We will give him IV fluids PT evaluation,   2.  Hyponatremia could be related to previous lung resection Check serum osmolality and urine  osmolality Give IV fluids with normal saline  3.  Poor p.o. appetite patient already on Megace and Remeron but he did not have Megace feels so we will start that here I will have dietitian evaluate the patient  4.  Bladder cancer with metastases oncology consult  5.  Peripheral vascular disease continue aspirin and Plavix as taken before  6.  Chronic constipation patient had a enema in the ER with good results  7. Misc: lovenox  All the records are reviewed and case discussed with ED provider. Management plans discussed with the patient, family and they are in agreement.  CODE STATUS: Code Status History    This patient does not have a recorded code status. Please follow your organizational policy for patients in this situation.       TOTAL TIME TAKING CARE OF THIS PATIENT: 55 minutes.    Dustin Flock M.D on 10/12/2017 at 9:36 PM  Between 7am to 6pm - Pager - [phone]  After 6pm go to [email or web address] - password [email or web address] [email or web address]  Office  [phone]  CC: Primary care physician; [facility] [place]

## 2017-10-12 NOTE — ED Triage Notes (Signed)
Pt comes into the ED via POV sent by the cancer center where they wanted him to be admitted and have lab work completed due to his overall weakness, dizziness, hypotension, and tachycardia.  Patient has even and unlabored respirations at this time.  Patient is a current cancer patient for bladder cancer.

## 2017-10-12 NOTE — Telephone Encounter (Signed)
  This is a change for him.  He was receiving radiation when we last saw him.  Plan was for chemotherapy.  He should probably be evaluated and possibly admitted.  Can the symptom management clinic see him?  M

## 2017-10-12 NOTE — Telephone Encounter (Signed)
Patient agrees to appointment today by 4 PM

## 2017-10-12 NOTE — Telephone Encounter (Signed)
  Please call patient.  M 

## 2017-10-12 NOTE — Progress Notes (Signed)
Symptom Management Consult note [facility]  [phone] [phone]  Patient Care Team: [facility] as PCP - General (General Practice)   Name of the patient: Patrick Hooper  [id number]  Sep 08, 1946   Date of visit: 10/12/17  Diagnosis- distant history of stage IB lung cancer and metastatic bladder cancer   Chief complaint/ Reason for visit- "Feel horrible"   Heme/Onc history: Patrick Hooper is a 71 y.o. adult with metastatic disease.  He has a recent history of muscle invasive bladder cancer and a distant history of lung cancer.  He has a history of stage IB lung cancer status post right upper lobe lobectomy in 09/2015.  Pathology revealed a T2aN0M0 adenocarcinoma.  Chest CT on 05/25/2017 revealed postoperative changes from a right upper lobe lobectomy without evidence of recurrence. There was new diffuse punctate sclerotic osseous lesions throughout the chest concerning for metastatic disease.  PET scan on 06/08/2017 at the [facility] revealed right upper lobe lobectomy without evidence of recurrence. There was a new 3.2 cm mass along the right inferior bladder wall, anterior to the ureteral vesicular junction.  There were multiple new focal sclerotic and some probable mixed lytic and sclerotic osseous lesions in the axial skeleton many of which demonstrated increased FDG avidity. Differential diagnoses included include metastatic lung cancer, bladder cancer or prostate cancer. There was increased FDG uptake in the left medial thigh musculature of uncertain etiology.  PET scan on 09/09/2017 revealed several hypermetabolic mixed lytic and sclerotic osseous metastases throughout the axial skeleton.  There was suspected intramuscular soft tissue metastases in the right quadratus lumborum and posterior left lumbar paraspinal musculature.  There was mildly hypermetabolic retroperitoneal lymphadenopathy, probably representing  nodal metastases.  There was diffuse irregular bladder wall thickening, asymmetrically prominent in the right bladder wall, suspicious for residual/recurrent bladder neoplasm.   There was nonspecific small ground-glass nodular opacity in the left upper lung lobe with low level metabolism.  There was no evidence of local tumor recurrence in the right lung status post right upper lobectomy   He underwent transurethral resection of bladder tumor (TURBT) with bilateral retrograde pyelogram on 08/12/2017.  He had a 3 cm bladder tumor on the right lateral wall with some sessile and papillary features.  Retrograde pyelograms were normal without filling defect.  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  He has a history of peripheral vascular disease s/p fem-fem bypass in 2016. He is on Plavix.  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  Colonoscopy was normal in 2011.  Interval history- Patient was last seen by Dr. Mike Gip 10/06/17. It was previously discussed to begin palliative chemotherapy consisting of cisplatin or carboplatin with gemcitabine. Chemotherapy class was scheduled and they discussed port placement. He continued to feel "terrible". He complained of lightheaded and dizziness. He was positive for orthostatic hypotension and tachycardia. He stated he was eating and drinking well. He was asked to hold his amlodipine. He complained of pain in his neck and continued taking oxycodone IR and MS Contin. Additionally he was experiencing opiate-induced constipation with intermittent B symptoms. His appetite continued to be poor and he continued to lose weight. Patient attended chemotherapy class and continued receiving radiation treatments with completion date of 10/12/2017. He was started on Megace for his appetite and weight loss and he was scheduled to have 1 L of fluids for several days due to a low sodium level.  In  the  interim he was seen in the emergency room for opioid-induced constipation. He was given an enema with relief. He was discharged home.  He presents today because he is "feeling terrible". He states "he does not want to live this way". He feels very weak, unable to eat, nauseated with occasional vomiting and constipation. Activities he could previously complete on his own such as showering, he is unable to do due to weakness. Additionally he feels dizzy when standing. He denies fevers or flulike illnesses. His nausea and vomiting has become progressively worse over the course of the past couple weeks. He has no appetite. His weakness has become worse over the past couple days. He can walk only a few steps before sitting down. He feels short of breath with exertion.  ECOG FS:1 - Symptomatic but completely ambulatory  Review of systems- Review of Systems  Constitutional: Positive for malaise/fatigue and weight loss. Negative for chills and fever.  HENT: Negative.   Eyes: Negative.   Respiratory: Positive for shortness of breath. Negative for cough and hemoptysis.        With exertion   Cardiovascular: Negative.  Negative for chest pain.  Gastrointestinal: Positive for constipation, nausea and vomiting. Negative for diarrhea.  Genitourinary: Negative.   Musculoskeletal: Positive for myalgias.  Skin: Negative.   Neurological: Positive for dizziness and weakness. Negative for headaches.  Endo/Heme/Allergies: Negative.   Psychiatric/Behavioral: Negative.      Current treatment- No current treatment at this time. Scheduled to start chemo at the end of the month.   Allergies  Allergen Reactions  . Cefazolin Rash and Shortness Of Breath  . Carvedilol     Other reaction(s): Unknown  . Chlorthalidone     Other reaction(s): Unknown  . Hydralazine     Other reaction(s): Unknown  . Hydrochlorothiazide Other (See Comments)     Past Medical History:  Diagnosis Date  . Bladder cancer ([place])   .  Hypertension   . Lung cancer ([place]) 2016  . PVD (peripheral vascular disease) ([place])      Past Surgical History:  Procedure Laterality Date  . BLADDER SURGERY  08/12/2017  . BYPASS GRAFT FEMORAL/POPLITEAL W/VEIN  2009  . COLONOSCOPY  2013  . CYSTOSCOPY  08/12/2017  . IR ABDOMINAL AORTOBIFEMORAL CATHETER SERIALOGRAM  2009  . LOBECTOMY Right 09/2015  . PACEMAKER INSERTION  2003    Social History   Socioeconomic History  . Marital status: Significant Other    Spouse name: Not on file  . Number of children: Not on file  . Years of education: Not on file  . Highest education level: Not on file  Social Needs  . Financial resource strain: Not on file  . Food insecurity - worry: Not on file  . Food insecurity - inability: Not on file  . Transportation needs - medical: Not on file  . Transportation needs - non-medical: Not on file  Occupational History  . Not on file  Tobacco Use  . Smoking status: Current Every Day Smoker    Packs/day: 1.00    Years: 55.00    Pack years: 55.00    Types: Cigarettes  . Smokeless tobacco: Never Used  Substance and Sexual Activity  . Alcohol use: Yes    Comment: Occasional beer  . Drug use: No  . Sexual activity: No  Other Topics Concern  . Not on file  Social History Narrative  . Not on file    Family History  Problem Relation Age of Onset  .  Cancer Mother   . Cancer Sister   . Cancer Maternal Grandmother      Current Outpatient Medications:  .  aspirin 81 MG chewable tablet, Chew 81 mg daily by mouth., Disp: , Rfl:  .  clopidogrel (PLAVIX) 75 MG tablet, Take 75 mg daily by mouth., Disp: , Rfl:  .  docusate sodium (COLACE) 100 MG capsule, Take 100 mg 2 (two) times daily by mouth., Disp: , Rfl:  .  mirtazapine (REMERON) 15 MG tablet, Take 15 mg at bedtime by mouth., Disp: , Rfl:  .  morphine (MSIR) 15 MG tablet, Take 15 mg by mouth every 12 (twelve) hours as needed for moderate pain or severe pain. , Disp: , Rfl:  .  bisacodyl  (DULCOLAX) 10 MG suppository, Place 1 suppository (10 mg total) rectally daily as needed for moderate constipation., Disp: 12 suppository, Rfl: 0 .  lactulose (CHRONULAC) 10 GM/15ML solution, Take 45 mLs (30 g total) by mouth 2 (two) times daily as needed for mild constipation or severe constipation., Disp: 1892 mL, Rfl: 0 .  megestrol (MEGACE ORAL) 40 MG/ML suspension, Take 5 mLs (200 mg total) by mouth daily. (Patient not taking: Reported on 10/12/2017), Disp: 240 mL, Rfl: 0 .  multivitamin-lutein (OCUVITE-LUTEIN) CAPS capsule, Take 1 capsule by mouth daily., Disp: 30 capsule, Rfl: 0 .  ondansetron (ZOFRAN) 4 MG tablet, Take 1 tablet (4 mg total) by mouth every 6 (six) hours as needed for nausea., Disp: 20 tablet, Rfl: 0 .  oxycodone (OXY-IR) 5 MG capsule, Take 1 capsule (5 mg total) by mouth every 4 (four) hours as needed for pain., Disp: 30 capsule, Rfl: 0 .  polyethylene glycol (MIRALAX / GLYCOLAX) packet, Take 17 g by mouth daily., Disp: 30 each, Rfl: 0 .  protein supplement shake (PREMIER PROTEIN) LIQD, Take 325 mLs (11 oz total) by mouth 2 (two) times daily between meals., Disp: 60 Can, Rfl: 0  Physical exam:  Vitals:   10/12/17 1618  BP: 114/77  Pulse: (!) 124  Resp: 18  Temp: (!) 97.3 F (36.3 C)  TempSrc: Tympanic  Weight: 127 lb 1.6 oz (57.7 kg)   Physical Exam  Constitutional: He is oriented to person, place, and time and well-developed, well-nourished, and in no distress.  HENT:  Head: Normocephalic and atraumatic.  Eyes: Pupils are equal, round, and reactive to light.  Neck: Normal range of motion. Neck supple.  Cardiovascular: Normal rate and regular rhythm.  Pulmonary/Chest: Effort normal and breath sounds normal.  Abdominal: Soft. Bowel sounds are hypoactive.  Musculoskeletal: Normal range of motion.  Neurological: He is alert and oriented to person, place, and time.  Skin: Skin is warm and dry.     CMP Latest Ref Rng & Units 10/14/2017  Glucose 65 - 99 mg/dL 92    BUN 6 - 20 mg/dL 6  Creatinine 0.61 - 1.24 mg/dL 0.52(L)  Sodium 135 - 145 mmol/L 130(L)  Potassium 3.5 - 5.1 mmol/L 3.6  Chloride 101 - 111 mmol/L 104  CO2 22 - 32 mmol/L 20(L)  Calcium 8.9 - 10.3 mg/dL 7.1(L)  Total Protein 6.5 - 8.1 g/dL -  Total Bilirubin 0.3 - 1.2 mg/dL -  Alkaline Phos 38 - 126 U/L -  AST 15 - 41 U/L -  ALT 17 - 63 U/L -   CBC Latest Ref Rng & Units 10/14/2017  WBC 3.8 - 10.6 K/uL 7.4  Hemoglobin 13.0 - 18.0 g/dL 12.2(L)  Hematocrit 40.0 - 52.0 % 35.6(L)  Platelets 150 -  440 K/uL 408    No images are attached to the encounter.  Dg Chest 2 View  Result Date: 10/12/2017 CLINICAL DATA:  71 year old male with history of bladder cancer and right upper lobectomy presents with overall weakness, dizziness and hypotension. EXAM: CHEST  2 VIEW COMPARISON:  Head CT 09/09/2017 PE FINDINGS: Postoperative volume loss of the right lung status post right upper lobectomy. Emphysematous hyperinflation of the remaining lungs. No alveolar consolidation, effusion or pneumothorax. 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 Cardiac silhouette is normal in size. No aortic aneurysm. Subtle T8 and T9 vertebral body lucencies may reflect known changes of osseous metastasis. IMPRESSION: 1. Postop appearance of the right lung with volume loss and scarring status post right upper lobectomy. 2. Hyperinflated lungs without pneumonic consolidation or CHF. 3. Faint lucencies of the T8 and T9 vertebral bodies may reflect known changes of osseous metastatic disease better characterized on prior PET-CT. Electronically Signed   By: Ashley Royalty M.D.   On: 10/12/2017 18:08   Ct Biopsy  Result Date: 09/21/2017 INDICATION: Widespread metastatic disease EXAM: CT BIOPSY MEDICATIONS: None. ANESTHESIA/SEDATION: Fentanyl 50 mcg IV; Versed 2 mg IV Moderate Sedation Time:  7 minute The patient was continuously monitored during the procedure by the interventional  radiology nurse under my direct supervision. FLUOROSCOPY TIME:  Fluoroscopy Time:  minutes  seconds ( mGy). COMPLICATIONS: None immediate. PROCEDURE: Informed written consent was obtained from the patient after a thorough discussion of the procedural risks, benefits and alternatives. All questions were addressed. Maximal Sterile Barrier Technique was utilized including caps, mask, sterile gowns, sterile gloves, sterile drape, hand hygiene and skin antiseptic. A timeout was performed prior to the initiation of the procedure. Under CT guidance, a(n) 17 gauge guide needle was advanced into the left paraspinal muscle mass. Subsequently, 3 18 gauge core specimens were obtained. Post biopsy images demonstrate no hemorrhage. Patient tolerated the procedure well without complication. Vital sign monitoring by nursing staff during the procedure will continue as patient is in the special procedures unit for post procedure observation. FINDINGS: The images document guide needle placement within the left paraspinal muscle mass. Post biopsy images demonstrate no hemorrhage. IMPRESSION: Successful CT-guided muscle mass biopsy. Electronically Signed   By: Marybelle Killings M.D.   On: 09/21/2017 12:46   Dg Abdomen Acute W/chest  Result Date: 10/12/2017 CLINICAL DATA:  Weakness, lightheadedness, hypotension and abdominal discomfort. EXAM: DG ABDOMEN ACUTE W/ 1V CHEST COMPARISON:  PET-CT 09/09/2017 FINDINGS: The abdominal gas pattern is negative for obstruction or perforation. No biliary or urinary calculi are evident. The upright view of the chest is negative for significant cardiopulmonary abnormality. IMPRESSION: Negative abdominal radiographs.  No acute cardiopulmonary disease. Electronically Signed   By: Andreas Newport M.D.   On: 10/12/2017 19:18     Assessment and plan- Patient is a 71 y.o. adult he presented for weakness, constipation, nausea and vomiting. Patient arrives in wheelchair with wife. Labs were not drawn.  Patient is pale. He is tachycardic with normal rhythm. Blood pressure is stable. He is afebrile. Weight is down 8 pounds. Lung sounds okay. Hypoactive bowel sounds.   1. Failure to thrive/dehydration/weakness: Unfortunately we were unable to get labs today due to time of day. Spoke to hospitalist and tried to get patient directly admitted but without labs or imaging he was taken to the emergency room. Report called ahead. Patient and wife okay with this plan. Dr. Mike Gip and Gaspar Bidding, NP were notified of ED admission. He is  to continue radiation at this time.   Visit Diagnosis 1. Weakness   2. Orthostatic hypotension   3. Weight loss   4. Tachycardia   5. Dehydration     Patient expressed understanding and was in agreement with this plan. He also understands that He can call clinic at any time with any questions, concerns, or complaints.   Greater than 50% was spent in counseling and coordination of care with this patient including but not limited to discussion of the relevant topics above (See A&P) including, but not limited to diagnosis and management of acute and chronic medical conditions.    Faythe Casa, AGNP-C [facility] [place]- [phone] Pager- [phone] 10/17/2017 9:00 AM

## 2017-10-12 NOTE — ED Provider Notes (Signed)
[facility] Emergency Department Provider Note    First MD Initiated Contact with Patient 10/12/17 1823     (approximate)  I have reviewed the triage vital signs and the nursing notes.   HISTORY  Chief Complaint Weakness and Dizziness    HPI Patrick Hooper is a 71 y.o. adult a history of cancer previously undergoing radiation therapy was scheduled to start chemotherapy in the upcoming weeks presents with several weeks of decline in his status.  Patient complaining of severe weakness particularly with standing.  He has been unable to eat or drink anything.  Denies any abdominal pain.  Did have bouts of constipation previously and thought that he was constipated again so he gave himself an enema without improvement.  No fevers at home.  Has had a nonproductive cough.  States that he is reconsidering whether he wants to proceed with chemotherapy at this time as his goals are primarily to be comfortable.  Past Medical History:  Diagnosis Date  . Bladder cancer ([place])   . Hypertension   . Lung cancer ([place]) 2016   Family History  Problem Relation Age of Onset  . Cancer Mother   . Cancer Sister   . Cancer Maternal Grandmother    Past Surgical History:  Procedure Laterality Date  . BLADDER SURGERY  08/12/2017  . BYPASS GRAFT FEMORAL/POPLITEAL W/VEIN  2009  . COLONOSCOPY  2013  . CYSTOSCOPY  08/12/2017  . IR ABDOMINAL AORTOBIFEMORAL CATHETER SERIALOGRAM  2009  . LOBECTOMY Right 09/2015  . PACEMAKER INSERTION  2003   Patient Active Problem List   Diagnosis Date Noted  . Neoplasm related pain 09/27/2017  . 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 Goals of care, counseling/discussion 09/12/2017  . 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 Malignant neoplasm of upper lobe of right lung ([place]) 09/07/2017  . Bone metastasis ([place]) 09/07/2017      Prior to Admission medications   Medication Sig Start Date End  Date Taking? Authorizing Provider  aspirin 81 MG chewable tablet Chew 81 mg daily by mouth.   Yes [provider]  clopidogrel (PLAVIX) 75 MG tablet Take 75 mg daily by mouth.   Yes [provider]  docusate sodium (COLACE) 100 MG capsule Take 100 mg 2 (two) times daily by mouth.   Yes [provider]  ibuprofen (ADVIL,MOTRIN) 200 MG tablet Take 400 mg every 6 (six) hours as needed by mouth.   Yes [provider]  mirtazapine (REMERON) 15 MG tablet Take 15 mg at bedtime by mouth.   Yes [provider]  morphine (MSIR) 15 MG tablet Take 15 mg by mouth every 12 (twelve) hours as needed for moderate pain or severe pain.    Yes [provider]  megestrol (MEGACE ORAL) 40 MG/ML suspension Take 5 mLs (200 mg total) by mouth daily. Patient not taking: Reported on 10/12/2017 10/06/17   [name], NP  oxycodone (OXY-IR) 5 MG capsule Take 1 capsule (5 mg total) by mouth every 4 (four) hours as needed for pain. Patient not taking: Reported on 10/12/2017 09/27/17   [name], NP    Allergies Cefazolin; Carvedilol; Chlorthalidone; Hydralazine; and Hydrochlorothiazide    Social History Social History   Tobacco Use  . Smoking status: Current Every Day Smoker    Packs/day: 1.00    Years: 55.00    Pack years: 55.00    Types: Cigarettes  . Smokeless tobacco: Never Used  Substance Use Topics  . Alcohol use: Yes    Comment: Occasional beer  . Drug use: No    Review of Systems Patient denies headaches, rhinorrhea, blurry vision, numbness, shortness of breath, chest pain, edema, cough, abdominal pain, nausea, vomiting, diarrhea, dysuria, fevers, rashes or hallucinations unless otherwise stated above in HPI. ____________________________________________   PHYSICAL EXAM:  VITAL SIGNS: Vitals:   10/12/17 1721 10/12/17 1930  BP: 105/71 101/72  Pulse: (!) 121 (!) 104  Resp: 20 19  Temp: 97.6 F (36.4 C)   SpO2: 97% 98%     Constitutional: Alert and oriented.  Frail and cachectic appearing Eyes: Conjunctivae are normal.  Head: Atraumatic. Nose: No congestion/rhinnorhea. Mouth/Throat: Mucous membranes are moist.   Neck: No stridor. Painless ROM.  Cardiovascular: Normal rate, regular rhythm. Grossly normal heart sounds.  Good peripheral circulation. Respiratory: Normal respiratory effort.  No retractions. Lungs CTAB. Gastrointestinal: Soft and nontender. No distention. No abdominal bruits. No CVA tenderness. Genitourinary:  Musculoskeletal: No lower extremity tenderness nor edema.  No joint effusions. Neurologic:  Normal speech and language. No gross focal neurologic deficits are appreciated. No facial droop Skin:  Skin is warm, dry and intact. No rash noted. Psychiatric: Mood and affect are normal. Speech and behavior are normal.  ____________________________________________   LABS (all labs ordered are listed, but only abnormal results are displayed)  Results for orders placed or performed during the hospital encounter of 10/12/17 (from the past 24 hour(s))  Comprehensive metabolic panel     Status: Abnormal   Collection Time: 10/12/17  5:24 PM  Result Value Ref Range   Sodium 126 (L) 135 - 145 mmol/L   Potassium 3.8 3.5 - 5.1 mmol/L   Chloride 95 (L) 101 - 111 mmol/L   CO2 20 (L) 22 - 32 mmol/L   Glucose, Bld 130 (H) 65 - 99 mg/dL   BUN 9 6 - 20 mg/dL   Creatinine, Ser 0.67 0.61 - 1.24 mg/dL   Calcium 7.6 (L) 8.9 - 10.3 mg/dL   Total Protein 7.7 6.5 - 8.1 g/dL   Albumin 3.2 (L) 3.5 - 5.0 g/dL   AST 21 15 - 41 U/L   ALT 11 (L) 17 - 63 U/L   Alkaline Phosphatase 140 (H) 38 - 126 U/L   Total Bilirubin 0.8 0.3 - 1.2 mg/dL   GFR calc non Af Amer >60 >60 mL/min   GFR calc Af Amer >60 >60 mL/min   Anion gap 11 5 - 15  CBC with Differential     Status: Abnormal   Collection Time: 10/12/17  5:24 PM  Result Value Ref Range   WBC 9.2 3.8 - 10.6 K/uL   RBC 5.14 4.40 - 5.90 MIL/uL   Hemoglobin  15.1 13.0 - 18.0 g/dL   HCT 44.0 40.0 - 52.0 %   MCV 85.8 80.0 - 100.0 fL   MCH 29.5 26.0 - 34.0 pg   MCHC 34.4 32.0 - 36.0 g/dL   RDW 14.5 11.5 - 14.5 %   Platelets 480 (H) 150 - 440 K/uL   Neutrophils Relative % 82 %   Neutro Abs 7.6 (H) 1.4 - 6.5 K/uL   Lymphocytes Relative 7 %   Lymphs Abs 0.6 (L) 1.0 - 3.6 K/uL   Monocytes Relative 10 %   Monocytes Absolute 0.9 0.2 - 1.0 K/uL   Eosinophils Relative 0 %   Eosinophils Absolute 0.0 0 - 0.7 K/uL   Basophils Relative 1 %   Basophils Absolute 0.1 0 - 0.1 K/uL  Lactic acid, plasma     Status: Abnormal   Collection Time: 10/12/17  5:25 PM  Result Value Ref Range   Lactic Acid, Venous 2.0 (HH) 0.5 - 1.9 mmol/L   ____________________________________________  EKG My review and personal interpretation at Time: 17:26   Indication: tachycardia  Rate: 120  Rhythm: sinus Axis: normal Other: poor r wave progression, nonspecific st change, no stemi ____________________________________________  RADIOLOGY  I personally reviewed all radiographic images ordered to evaluate for the above acute complaints and reviewed radiology reports and findings.  These findings were personally discussed with the patient.  Please see medical record for radiology report.  ____________________________________________   PROCEDURES  Procedure(s) performed:  .Critical Care Performed by: Merlyn Lot, MD Authorized by: Merlyn Lot, MD   Critical care provider statement:    Critical care time (minutes):  30   Critical care time was exclusive of:  Separately billable procedures and treating other patients   Critical care was necessary to treat or prevent imminent or life-threatening deterioration of the following conditions:  Metabolic crisis   Critical care was time spent personally by me on the following activities:  Development of treatment plan with patient or surrogate, discussions with consultants, evaluation of patient's response to  treatment, examination of patient, obtaining history from patient or surrogate, ordering and performing treatments and interventions, ordering and review of laboratory studies, ordering and review of radiographic studies, pulse oximetry, re-evaluation of patient's condition and review of old charts      Critical Care performed: ____________________________________________   INITIAL IMPRESSION / [place] / ED COURSE  Pertinent labs & imaging results that were available during my care of the patient were reviewed by me and considered in my medical decision making (see chart for details).  DDX: Dehydration, sepsis, pna, uti, hypoglycemia, cva, drug effect, ,    Reyhan Logon Uttech is a 71 y.o. who presents to the ED with symptoms as described above.  Patient is very frail and cachectic appearing.  He is tachycardic.  We will give IV fluids for dehydration.  X-rays ordered to evaluate for obstructive process though his abdominal exam is soft and benign.  No evidence of obstructive pattern or perforation.  No evidence pneumonia.  Less consistent with sepsis.  Blood work does show evidence that patient does have hyponatremia concerning for consistent dehydration.  Do not believe the patient has evidence of acute infectious process as his white count is normal.  Blood cultures.  Will start antibiotics if the patient becomes febrile or if his heart rate fails to improve with IV fluids.  Patient and family at bedside and had extensive discussion with them regarding their goals of care and the patient states that he would like to speak with palliative care as he states he wants to "stop suffering."  Have discussed with the patient and available family all diagnostics and treatments performed thus far and all questions were answered to the best of my ability. The patient demonstrates understanding and agreement with plan.       ____________________________________________   FINAL CLINICAL  IMPRESSION(S) / ED DIAGNOSES  Final diagnoses:  Hyponatremia  Lactic acidosis  Dehydration      NEW MEDICATIONS STARTED DURING THIS VISIT:  This SmartLink is deprecated. Use AVSMEDLIST instead to display the medication list for a patient.   Note:  This document was prepared using Dragon voice recognition software and may include unintentional dictation errors.    Merlyn Lot, MD 10/12/17 2040

## 2017-10-12 NOTE — Telephone Encounter (Signed)
I received a call from S/O reporting that he is not doing well at all, Not eating, can't hardly walk just doing poorly overall and is asking what can be done for him. Please advise

## 2017-10-13 ENCOUNTER — Ambulatory Visit: Payer: Non-veteran care

## 2017-10-13 ENCOUNTER — Encounter: Payer: Self-pay | Admitting: Hematology and Oncology

## 2017-10-13 ENCOUNTER — Other Ambulatory Visit: Payer: Self-pay

## 2017-10-13 DIAGNOSIS — Z681 Body mass index (BMI) 19 or less, adult: Secondary | ICD-10-CM | POA: Diagnosis not present

## 2017-10-13 DIAGNOSIS — E860 Dehydration: Secondary | ICD-10-CM | POA: Diagnosis present

## 2017-10-13 DIAGNOSIS — I10 Essential (primary) hypertension: Secondary | ICD-10-CM

## 2017-10-13 DIAGNOSIS — E872 Acidosis: Secondary | ICD-10-CM

## 2017-10-13 DIAGNOSIS — I951 Orthostatic hypotension: Secondary | ICD-10-CM | POA: Diagnosis present

## 2017-10-13 DIAGNOSIS — F17210 Nicotine dependence, cigarettes, uncomplicated: Secondary | ICD-10-CM

## 2017-10-13 DIAGNOSIS — I739 Peripheral vascular disease, unspecified: Secondary | ICD-10-CM

## 2017-10-13 DIAGNOSIS — E871 Hypo-osmolality and hyponatremia: Secondary | ICD-10-CM

## 2017-10-13 DIAGNOSIS — Z803 Family history of malignant neoplasm of breast: Secondary | ICD-10-CM

## 2017-10-13 DIAGNOSIS — C7951 Secondary malignant neoplasm of bone: Secondary | ICD-10-CM | POA: Diagnosis present

## 2017-10-13 DIAGNOSIS — Z79899 Other long term (current) drug therapy: Secondary | ICD-10-CM | POA: Diagnosis not present

## 2017-10-13 DIAGNOSIS — Z85118 Personal history of other malignant neoplasm of bronchus and lung: Secondary | ICD-10-CM

## 2017-10-13 DIAGNOSIS — E440 Moderate protein-calorie malnutrition: Secondary | ICD-10-CM | POA: Diagnosis present

## 2017-10-13 DIAGNOSIS — Z7902 Long term (current) use of antithrombotics/antiplatelets: Secondary | ICD-10-CM | POA: Diagnosis not present

## 2017-10-13 DIAGNOSIS — Z7982 Long term (current) use of aspirin: Secondary | ICD-10-CM | POA: Diagnosis not present

## 2017-10-13 DIAGNOSIS — Z923 Personal history of irradiation: Secondary | ICD-10-CM

## 2017-10-13 DIAGNOSIS — Z809 Family history of malignant neoplasm, unspecified: Secondary | ICD-10-CM | POA: Diagnosis not present

## 2017-10-13 DIAGNOSIS — Z902 Acquired absence of lung [part of]: Secondary | ICD-10-CM | POA: Diagnosis not present

## 2017-10-13 DIAGNOSIS — Z950 Presence of cardiac pacemaker: Secondary | ICD-10-CM | POA: Diagnosis not present

## 2017-10-13 DIAGNOSIS — L8990 Pressure ulcer of unspecified site, unspecified stage: Secondary | ICD-10-CM

## 2017-10-13 DIAGNOSIS — I959 Hypotension, unspecified: Secondary | ICD-10-CM | POA: Diagnosis present

## 2017-10-13 DIAGNOSIS — K5900 Constipation, unspecified: Secondary | ICD-10-CM

## 2017-10-13 DIAGNOSIS — C679 Malignant neoplasm of bladder, unspecified: Secondary | ICD-10-CM | POA: Diagnosis present

## 2017-10-13 DIAGNOSIS — R531 Weakness: Secondary | ICD-10-CM | POA: Diagnosis not present

## 2017-10-13 DIAGNOSIS — K5903 Drug induced constipation: Secondary | ICD-10-CM | POA: Diagnosis present

## 2017-10-13 LAB — BASIC METABOLIC PANEL
ANION GAP: 9 (ref 5–15)
BUN: 7 mg/dL (ref 6–20)
CHLORIDE: 101 mmol/L (ref 101–111)
CO2: 19 mmol/L — ABNORMAL LOW (ref 22–32)
Calcium: 6.8 mg/dL — ABNORMAL LOW (ref 8.9–10.3)
Creatinine, Ser: 0.58 mg/dL — ABNORMAL LOW (ref 0.61–1.24)
GFR calc Af Amer: >60 mL/min (ref >60)
GLUCOSE: 92 mg/dL (ref 65–99)
POTASSIUM: 3.3 mmol/L — AB (ref 3.5–5.1)
Sodium: 129 mmol/L — ABNORMAL LOW (ref 135–145)

## 2017-10-13 LAB — CBC
HCT: 38.1 % — ABNORMAL LOW (ref 40.0–52.0)
HEMOGLOBIN: 13 g/dL (ref 13.0–18.0)
MCH: 29.1 pg (ref 26.0–34.0)
MCHC: 34.1 g/dL (ref 32.0–36.0)
MCV: 85.5 fL (ref 80.0–100.0)
PLATELETS: 442 10*3/uL — AB (ref 150–440)
RBC: 4.46 MIL/uL (ref 4.40–5.90)
RDW: 14.4 % (ref 11.5–14.5)
WBC: 8.1 10*3/uL (ref 3.8–10.6)

## 2017-10-13 MED ORDER — PREMIER PROTEIN SHAKE: 11.0000 [oz_av] | Freq: Two times a day (BID) | ORAL | Status: DC

## 2017-10-13 MED ORDER — OCUVITE-LUTEIN PO CAPS
1.0000 | ORAL_CAPSULE | Freq: Every day | ORAL | Status: DC
  Administered 2017-10-13 – 2017-10-14 (×2): 1 via ORAL
  Filled 2017-10-13 (×2): qty 1

## 2017-10-13 MED ORDER — POTASSIUM CHLORIDE IN NACL 20-0.9 MEQ/L-% IV SOLN
INTRAVENOUS | Status: DC
  Administered 2017-10-13 – 2017-10-14 (×2): via INTRAVENOUS
  Filled 2017-10-13 (×3): qty 1000

## 2017-10-13 MED ORDER — NICOTINE 21 MG/24HR TD PT24: 21.0000 mg | MEDICATED_PATCH | Freq: Every day | TRANSDERMAL | Status: DC

## 2017-10-13 MED ORDER — METHYLNALTREXONE BROMIDE 12 MG/0.6ML ~~LOC~~ SOLN
12.0000 mg | Freq: Once | SUBCUTANEOUS | Status: AC
  Administered 2017-10-13: 16:00:00 12 mg via SUBCUTANEOUS
  Filled 2017-10-13: qty 0.6

## 2017-10-13 MED ORDER — GUAIFENESIN-DM 100-10 MG/5ML PO SYRP
5.0000 mL | ORAL_SOLUTION | ORAL | Status: DC | PRN
  Administered 2017-10-13: 5 mL via ORAL
  Filled 2017-10-13 (×2): qty 5

## 2017-10-13 MED ORDER — ADULT MULTIVITAMIN W/MINERALS CH
1.0000 | ORAL_TABLET | Freq: Every day | ORAL | Status: DC
  Administered 2017-10-13 – 2017-10-14 (×2): 1 via ORAL
  Filled 2017-10-13 (×2): qty 1

## 2017-10-13 NOTE — Progress Notes (Signed)
Initial Nutrition Assessment  DOCUMENTATION CODES:   Severe malnutrition in context of chronic illness  INTERVENTION:   Premier Protein BID, each supplement provides 160 kcal and 30 grams of protein.   Snacks  Magic cup TID with meals, each supplement provides 290 kcal and 9 grams of protein  MVI daily  Ocuvite daily for wound healing (provides zinc, vitamin A, vitamin C, Vitamin E, copper, and selenium)  NUTRITION DIAGNOSIS:   Severe Malnutrition related to cancer and cancer related treatments as evidenced by 11 percent weight loss in 1 month, severe fat depletion, severe muscle depletion.  GOAL:   Patient will meet greater than or equal to 90% of their needs  MONITOR:   PO intake, Supplement acceptance, Labs, Weight trends, I & O's, Skin  REASON FOR ASSESSMENT:   Consult Assessment of nutrition requirement/status  ASSESSMENT:   71 y.o. adult with a known history of active bladder cancer, essential hypertension, previous history of lung cancer status post resection and peripheral vascular disease presenting with generalized weakness and dizziness ongoing for the past few weeks.  Patient states that he has no appetite and is not able to eat much because he does not feel like eating.   Met with pt in room today. Pt reports poor appetite and oral intake for the past two weeks r/t constipation. Pt reports his appetite is improved today; pt eating 90% of meals. Per chart, pt has lost 15lbs(11%) over the past month; this is severe weight loss. Pt reports that he does not like supplements. RD discussed with pt the importance of adequate protein needed for wound healing and to preserve lean muscle. Pt is willing to try Premier Protein. RD will also add snacks and MVI. 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 RD discussed with pt the importance of adequate hydration as well as the importance of getting insoluble fiber in the diet.   Medications  reviewed and include: aspirin, plavix, colace, lovenox, megace, mirtazapine, nicotine, zofran, oxycodone   Labs reviewed: Na 129(L), K 3.3(L), creat 0.58(L), Ca 6.8(L) Mg 2.5(H)- 12/6  Nutrition-Focused physical exam completed. Findings are mild to moderate fat depletions, moderate to severe muscle depletions over entire body, and no edema.   Diet Order:  Diet regular Room service appropriate? Yes; Fluid consistency: Thin  EDUCATION NEEDS:   Education needs have been addressed  Skin:  Skin Assessment: (Stage II buttocks )  Last BM:  constipation   Height:   Ht Readings from Last 1 Encounters:  10/12/17 5' 11"  (1.803 m)    Weight:   Wt Readings from Last 1 Encounters:  10/12/17 127 lb 14.4 oz (58 kg)    Ideal Body Weight:  78.2 kg  BMI:  Body mass index is 17.84 kg/m.  Estimated Nutritional Needs:   Kcal:  1700-1900kcal/day   Protein:  87-98g/day   Fluid:  >1.7L/day   Koleen Distance MS, RD, LDN Pager #[phone] After Hours Pager: [phone]

## 2017-10-13 NOTE — Evaluation (Signed)
Physical Therapy Evaluation Patient Details Name: Patrick Hooper MRN: [id number] DOB: 05/07/1946 Today's Date: 10/13/2017   History of Present Illness  Pt is a 71 y.o. male presenting to hospital with severe weakness and not eating/drinking.  Pt admitted to hospital with generalized weakness d/t underlying malignancy, hyponatremia, and dehydration.  PMH includes bladder CA, lung CA, bone metastasis, htn, R lobectomy, pacemaker.  Clinical Impression  Prior to hospital admission, pt was independent with ambulation and ADL's.  Pt lives with his friend in 1 level home with steps to enter.  Currently pt is modified independent supine to sit; CGA to min assist with transfers (improved balance noted with use of RW); and CGA ambulating 140 feet with RW (pt unsteady without UE support and improved balance noted with use of RW).  Pt demonstrating generalized weakness and decreased activity tolerance during session but appears motivated to get stronger and get moving.  Pt would benefit from skilled PT to address noted impairments and functional limitations (see below for any additional details).  Upon hospital discharge, recommend pt discharge to home with support of friend and HHPT.    Follow Up Recommendations Home health PT    Equipment Recommendations  Rolling walker with 5" wheels    Recommendations for Other Services       Precautions / Restrictions Precautions Precautions: Fall Precaution Comments: Metastatic disease to bones Restrictions Weight Bearing Restrictions: No      Mobility  Bed Mobility Overal bed mobility: Modified Independent             General bed mobility comments: Supine to sit with increased effort.  Transfers Overall transfer level: Needs assistance Equipment used: None Transfers: Sit to/from Stand Sit to Stand: Min assist with no AD; CGA with RW         General transfer comment: assist to steady with no UE support  standing  Ambulation/Gait Ambulation/Gait assistance: Min assist;Min guard Ambulation Distance (Feet): 140 Feet Assistive device: Rolling walker (2 wheeled)   Gait velocity: decreased   General Gait Details: Trialed pt with no AD but pt unsteady and pt agreeable to using RW; pt steady with use of RW; decreased B step length  Stairs            Wheelchair Mobility    Modified Rankin (Stroke Patients Only)       Balance Overall balance assessment: Needs assistance Sitting-balance support: No upper extremity supported;Feet supported Sitting balance-Leahy Scale: Good Sitting balance - Comments: steady sitting reaching within BOS   Standing balance support: No upper extremity supported Standing balance-Leahy Scale: Poor Standing balance comment: assist to steady initially with standing but then CGA with static standing no UE support                             Pertinent Vitals/Pain Pain Assessment: 0-10 Pain Score: 4  Pain Location: bone pain all over Pain Descriptors / Indicators: Constant Pain Intervention(s): Limited activity within patient's tolerance;Monitored during session;Repositioned(pt declined pain meds)  Vitals (HR and O2 on room air) stable and WFL throughout treatment session.    Home Living Family/patient expects to be discharged to:: Private residence Living Arrangements: Other (Comment)(Pt's friend Mardene Celeste) Available Help at Discharge: Friend(s) Type of Home: House Home Access: Stairs to enter   [employer] of Steps: 6 with L railing from garage (typical entrance) or 3 from front (no railing) Home Layout: One level Home Equipment: (Not sure if has RW.)  Prior Function Level of Independence: Independent         Comments: Pt denies any falls in past 6 months.     Hand Dominance        Extremity/Trunk Assessment   Upper Extremity Assessment Upper Extremity Assessment: Generalized weakness    Lower Extremity  Assessment Lower Extremity Assessment: Generalized weakness    Cervical / Trunk Assessment Cervical / Trunk Assessment: Normal  Communication   Communication: No difficulties  Cognition Arousal/Alertness: Awake/alert Behavior During Therapy: WFL for tasks assessed/performed Overall Cognitive Status: Within Functional Limits for tasks assessed                                        General Comments General comments (skin integrity, edema, etc.): Pt resting in bed upon PT entry; pt's friend present during session and daughter present end of session.  Nursing cleared pt for participation in physical therapy.  Pt agreeable to PT session.    Exercises  Gait training with RW (vc's for technique)   Assessment/Plan    PT Assessment Patient needs continued PT services  PT Problem List Decreased strength;Decreased activity tolerance;Decreased balance;Decreased mobility;Decreased knowledge of use of DME;Pain       PT Treatment Interventions DME instruction;Gait training;Stair training;Functional mobility training;Therapeutic activities;Therapeutic exercise;Balance training;Patient/family education    PT Goals (Current goals can be found in the Care Plan section)  Acute Rehab PT Goals Patient Stated Goal: to go home PT Goal Formulation: With patient Time For Goal Achievement: 10/27/17 Potential to Achieve Goals: Good    Frequency Min 2X/week   Barriers to discharge        Co-evaluation               AM-PAC PT "6 Clicks" Daily Activity  Outcome Measure Difficulty turning over in bed (including adjusting bedclothes, sheets and blankets)?: A Little Difficulty moving from lying on back to sitting on the side of the bed? : A Little Difficulty sitting down on and standing up from a chair with arms (e.g., wheelchair, bedside commode, etc,.)?: Unable Help needed moving to and from a bed to chair (including a wheelchair)?: A Little Help needed walking in hospital  room?: A Little Help needed climbing 3-5 steps with a railing? : A Little 6 Click Score: 16    End of Session Equipment Utilized During Treatment: Gait belt Activity Tolerance: Patient limited by fatigue Patient left: in chair;with call bell/phone within reach;with chair alarm set;with family/visitor present Nurse Communication: Mobility status;Precautions PT Visit Diagnosis: Other abnormalities of gait and mobility (R26.89);Muscle weakness (generalized) (M62.81)    Time: 9563-8756 PT Time Calculation (min) (ACUTE ONLY): 31 min   Charges:   PT Evaluation $PT Eval Low Complexity: 1 Low PT Treatments $Gait Training: 8-22 mins   PT G Codes:   PT G-Codes **NOT FOR INPATIENT CLASS** Functional Assessment Tool Used: AM-PAC 6 Clicks Basic Mobility Functional Limitation: Mobility: Walking and moving around Mobility: Walking and Moving Around Current Status (E3329): At least 40 percent but less than 60 percent impaired, limited or restricted Mobility: Walking and Moving Around Goal Status [phone]): 0 percent impaired, limited or restricted    Leitha Bleak, PT 10/13/17, 4:52 PM [phone]

## 2017-10-13 NOTE — Progress Notes (Signed)
Patient ID: Patrick Hooper, adult   DOB: [birth year], 71 y.o.   MRN: [id number]  [facility] PROGRESS NOTE  Patrick Hooper UYQ:[id number] DOB: Dec 29, 1945 DOA: 10/12/2017 PCP: [facility] [place]  HPI/Subjective: Patient feeling very weak.  Has not eaten in a while.  Been battling constipation.  He noted to come to the ER for an enema the other night.  He has not been doing well.  Objective: Vitals:   10/13/17 0750 10/13/17 0800  BP: (!) 89/43 109/63  Pulse: 91   Resp: 18   Temp: 98.6 F (37 C)   SpO2: 98%     Filed Weights   10/12/17 1722 10/12/17 2315  Weight: 57.6 kg (127 lb) 58 kg (127 lb 14.4 oz)    ROS: Review of Systems  Constitutional: Negative for chills and fever.  Eyes: Negative for blurred vision.  Respiratory: Negative for cough and shortness of breath.   Cardiovascular: Negative for chest pain.  Gastrointestinal: Positive for constipation, nausea and vomiting. Negative for abdominal pain and diarrhea.  Genitourinary: Negative for dysuria.  Musculoskeletal: Negative for joint pain.  Neurological: Negative for dizziness and headaches.   Exam: Physical Exam  Constitutional: He is oriented to person, place, and time.  HENT:  Nose: No mucosal edema.  Mouth/Throat: No oropharyngeal exudate or posterior oropharyngeal edema.  Eyes: Conjunctivae, EOM and lids are normal. Pupils are equal, round, and reactive to light.  Neck: No JVD present. Carotid bruit is not present. No edema present. No thyroid mass and no thyromegaly present.  Cardiovascular: S1 normal and S2 normal. Exam reveals no gallop.  No murmur heard. Pulses:      Dorsalis pedis pulses are 2+ on the right side, and 2+ on the left side.  Respiratory: No respiratory distress. He has no wheezes. He has no rhonchi. He has no rales.  GI: Soft. Bowel sounds are normal. There is no tenderness.  Musculoskeletal:       Right ankle: He exhibits no swelling.       Left ankle: He exhibits no  swelling.  Lymphadenopathy:    He has no cervical adenopathy.  Neurological: He is alert and oriented to person, place, and time. No cranial nerve deficit.  Skin: Skin is warm. No rash noted. Nails show no clubbing.  Psychiatric: He has a normal mood and affect.      Data Reviewed: Basic Metabolic Panel: Recent Labs  Lab 10/12/17 1724 10/13/17 0334  NA 126* 129*  K 3.8 3.3*  CL 95* 101  CO2 20* 19*  GLUCOSE 130* 92  BUN 9 7  CREATININE 0.67 0.58*  CALCIUM 7.6* 6.8*   Liver Function Tests: Recent Labs  Lab 10/12/17 1724  AST 21  ALT 11*  ALKPHOS 140*  BILITOT 0.8  PROT 7.7  ALBUMIN 3.2*   CBC: Recent Labs  Lab 10/12/17 1724 10/13/17 0334  WBC 9.2 8.1  NEUTROABS 7.6*  --   HGB 15.1 13.0  HCT 44.0 38.1*  MCV 85.8 85.5  PLT 480* 442*      Recent Results (from the past 240 hour(s))  Blood culture (routine x 2)     Status: None (Preliminary result)   Collection Time: 10/12/17  5:24 PM  Result Value Ref Range Status   Specimen Description BLOOD RIGHT FORE ARM  Final   Special Requests   Final    BOTTLES DRAWN AEROBIC AND ANAEROBIC Blood Culture adequate volume   Culture NO GROWTH < 12 HOURS  Final   Report  Status PENDING  Incomplete  Blood culture (routine x 2)     Status: None (Preliminary result)   Collection Time: 10/12/17 11:19 PM  Result Value Ref Range Status   Specimen Description BLOOD LEFT ARM  Final   Special Requests   Final    BOTTLES DRAWN AEROBIC AND ANAEROBIC Blood Culture adequate volume   Culture NO GROWTH < 12 HOURS  Final   Report Status PENDING  Incomplete     Studies: Dg Chest 2 View  Result Date: 10/12/2017 CLINICAL DATA:  71 year old male with history of bladder cancer and right upper lobectomy presents with overall weakness, dizziness and hypotension. EXAM: CHEST  2 VIEW COMPARISON:  Head CT 09/09/2017 PE FINDINGS: Postoperative volume loss of the right lung status post right upper lobectomy. Emphysematous hyperinflation of  the remaining lungs. No alveolar consolidation, effusion or pneumothorax. 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 Cardiac silhouette is normal in size. No aortic aneurysm. Subtle T8 and T9 vertebral body lucencies may reflect known changes of osseous metastasis. IMPRESSION: 1. Postop appearance of the right lung with volume loss and scarring status post right upper lobectomy. 2. Hyperinflated lungs without pneumonic consolidation or CHF. 3. Faint lucencies of the T8 and T9 vertebral bodies may reflect known changes of osseous metastatic disease better characterized on prior PET-CT. Electronically Signed   By: Ashley Royalty M.D.   On: 10/12/2017 18:08   Dg Abdomen Acute W/chest  Result Date: 10/12/2017 CLINICAL DATA:  Weakness, lightheadedness, hypotension and abdominal discomfort. EXAM: DG ABDOMEN ACUTE W/ 1V CHEST COMPARISON:  PET-CT 09/09/2017 FINDINGS: The abdominal gas pattern is negative for obstruction or perforation. No biliary or urinary calculi are evident. The upright view of the chest is negative for significant cardiopulmonary abnormality. IMPRESSION: Negative abdominal radiographs.  No acute cardiopulmonary disease. Electronically Signed   By: Andreas Newport M.D.   On: 10/12/2017 19:18    Scheduled Meds: . aspirin  81 mg Oral Daily  . clopidogrel  75 mg Oral Daily  . docusate sodium  100 mg Oral BID  . enoxaparin (LOVENOX) injection  40 mg Subcutaneous Q24H  . megestrol  200 mg Oral Daily  . methylnaltrexone  12 mg Subcutaneous Once  . mirtazapine  15 mg Oral QHS  . multivitamin with minerals  1 tablet Oral Daily  . multivitamin-lutein  1 capsule Oral Daily  . nicotine  21 mg Transdermal Once  . protein supplement shake  11 oz Oral BID BM   Continuous Infusions: . 0.9 % NaCl with KCl 20 mEq / L 50 mL/hr at 10/13/17 3557    Assessment/Plan:   1. Hypotension.  IV fluid hydration.  Check orthostatics. 2. Hypokalemia replace  potassium and IV fluids. 3. Hyponatremia.  Hydration ordered. 4. History of bladder cancer, history of lung cancer.  Bone metastases.  Oncology consultation.  Listed as full code 5. Opioid-induced constipation.  Relistor ordered.  Continue Colace 6. Moderate malnutrition 7. History of peripheral vascular disease on aspirin and Plavix  Code Status:     Code Status Orders  (From admission, onward)        Start     Ordered   10/12/17 2355  Full code  Continuous     10/12/17 2354    Code Status History    Date Active Date Inactive Code Status Order ID Comments User Context   This patient has a current code status but no historical code status.      Disposition Plan:  Evaluate daily  Time spent: 28 minutes  [place]

## 2017-10-13 NOTE — Consult Note (Signed)
[facility]  Date of admission:  10/12/2017  Inpatient day:  10/13/2017  Consulting physician: Dr. Loletha Grayer   Reason for Consultation:  Bladder cancer.  Chief Complaint: Patrick Hooper is a 71 y.o. gentleman with metastatic bladder cancer who was admitted through the emergency room with generalized weakness, hyponatremia, and lactic acidosis.  HPI: The patient was diagnosed with metastatic bladder cancer. He began palliative radiation to metastasis to the cervical and thoracic spine on 09/29/2017.  Plan was for initiation of carboplatin or cisplatin with gemcitabine after completion of radiation.  He was last seen in the medical oncology clinic on 10/06/2017.  At that time, appetite was poor.  He was prescribed Megace, but was not picked up (not available at the pharmacy).  He was lightheaded and dizzy.  He was tapering his amlodipine to off.  He received fluids in clinic and returned on 10/07/2017 for additional fluids.   He was seen in the ER on 10/08/2017 for constipation.  He received an enema and had good relief of symptoms.  Subsequently, he became dehydrated and weak.  He presented on 10/12/2017 with progressive weakness and dehydration.  He feels tremendously better since admission.  He has received IVF and electrolyte supplementation.  He is beginning to eat.  He states that he wants to feel better and continue his radiation.  He is still considering chemotherapy.   Past Medical History:  Diagnosis Date  . Bladder cancer ([place])   . Hypertension   . Lung cancer ([place]) 2016  . PVD (peripheral vascular disease) ([place])     Past Surgical History:  Procedure Laterality Date  . BLADDER SURGERY  08/12/2017  . BYPASS GRAFT FEMORAL/POPLITEAL W/VEIN  2009  . COLONOSCOPY  2013  . CYSTOSCOPY  08/12/2017  . IR ABDOMINAL AORTOBIFEMORAL CATHETER SERIALOGRAM  2009  . LOBECTOMY Right 09/2015  . PACEMAKER INSERTION  2003    Family History  Problem Relation  Age of Onset  . Cancer Mother   . Cancer Sister   . Cancer Maternal Grandmother     Social History:  reports that he has been smoking cigarettes.  He has a 55.00 pack-year smoking history. he has never used smokeless tobacco. He reports that he drinks alcohol. He reports that he does not use drugs.  The patient is accompanied by his girlfriend, [name], and his step-daughter.  Allergies:  Allergies  Allergen Reactions  . Cefazolin Rash and Shortness Of Breath  . Carvedilol     Other reaction(s): Unknown  . Chlorthalidone     Other reaction(s): Unknown  . Hydralazine     Other reaction(s): Unknown  . Hydrochlorothiazide Other (See Comments)    Medications Prior to Admission  Medication Sig Dispense Refill  . aspirin 81 MG chewable tablet Chew 81 mg daily by mouth.    . clopidogrel (PLAVIX) 75 MG tablet Take 75 mg daily by mouth.    . docusate sodium (COLACE) 100 MG capsule Take 100 mg 2 (two) times daily by mouth.    Marland Kitchen ibuprofen (ADVIL,MOTRIN) 200 MG tablet Take 400 mg every 6 (six) hours as needed by mouth.    . mirtazapine (REMERON) 15 MG tablet Take 15 mg at bedtime by mouth.    . morphine (MSIR) 15 MG tablet Take 15 mg by mouth every 12 (twelve) hours as needed for moderate pain or severe pain.     . megestrol (MEGACE ORAL) 40 MG/ML suspension Take 5 mLs (200 mg total) by mouth daily. (Patient not taking:  Reported on 10/12/2017) 240 mL 0  . oxycodone (OXY-IR) 5 MG capsule Take 1 capsule (5 mg total) by mouth every 4 (four) hours as needed for pain. (Patient not taking: Reported on 10/12/2017) 30 capsule 0    Review of Systems: GENERAL:  Fatigue, but feeling better since admission.  Weight loss of 10-15 pounds in the last 1-2 months.. PERFORMANCE STATUS (ECOG):  2 HEENT:  No visual changes, sore throat, mouth sores or tenderness. Lungs: Shortness of breath with exertion.  Cough.  No hemoptysis. Cardiac:  Implanted pacemaker. No chest pain, palpitations, orthopnea, or PND.  Blood  pressure fluctuates. GI:  Appetite improved.  Constipation relieved.  No nausea, vomiting, diarrhea, melena or hematochezia. GU:  Dribbling.  Urgency, frequency, dysuria and nocturia.  No hematuria. Musculoskeletal:  Bone pain (lower ribs, shoulders, back, neck, shoulder).  Joint pain (left wrist and right elbow). No muscle tenderness. Extremities:  No pain or swelling. Skin:  No rashes or skin changes. Neuro:  General weakness.  No headache, numbness or weakness, balance or coordination issues. Endocrine:  No diabetes, thyroid issues, hot flashes or night sweats. Psych:  No mood changes, depression or anxiety. Pain:  Bone pain. Review of systems:  All other systems reviewed and found to be negative  Physical Exam:  Blood pressure (!) 110/54, pulse 90, temperature 97.9 F (36.6 C), temperature source Oral, resp. rate 14, height _0  (1.803 m), weight 127 lb 14.4 oz (58 kg), SpO2 98 %.  GENERAL:  Thin gentleman sitting comfortably on the medical unit in no acute distress. MENTAL STATUS:  Alert and oriented to person, place and time. HEAD:  Thin gray hair.  Temporal wasting.  Normocephalic, atraumatic, face symmetric, no Cushingoid features. EYES:  Blue eyes.  Pupils equal round and reactive to light and accomodation.  No conjunctivitis or scleral icterus. ENT:  Oropharynx clear without lesion.  Tongue normal.  Upper dentures.  No lower teeth.  Mucous membranes moist.  RESPIRATORY:  Clear to auscultation without rales, wheezes or rhonchi. CARDIOVASCULAR:  Regular rate and rhythm without murmur, rub or gallop. ABDOMEN:  Scaphoid.  Soft, non-tender, with active bowel sounds, and no hepatosplenomegaly.  No masses. SKIN:  No rashes, ulcers or lesions. EXTREMITIES: Nicotine stained nails.  Thin.  No edema, no skin discoloration or tenderness.  No palpable cords. NEUROLOGICAL: Unremarkable. PSYCH:  Appropriate.   Results for orders placed or performed during the hospital encounter of 10/12/17  (from the past 48 hour(s))  Comprehensive metabolic panel     Status: Abnormal   Collection Time: 10/12/17  5:24 PM  Result Value Ref Range   Sodium 126 (L) 135 - 145 mmol/L   Potassium 3.8 3.5 - 5.1 mmol/L   Chloride 95 (L) 101 - 111 mmol/L   CO2 20 (L) 22 - 32 mmol/L   Glucose, Bld 130 (H) 65 - 99 mg/dL   BUN 9 6 - 20 mg/dL   Creatinine, Ser 0.67 0.61 - 1.24 mg/dL   Calcium 7.6 (L) 8.9 - 10.3 mg/dL   Total Protein 7.7 6.5 - 8.1 g/dL   Albumin 3.2 (L) 3.5 - 5.0 g/dL   AST 21 15 - 41 U/L   ALT 11 (L) 17 - 63 U/L   Alkaline Phosphatase 140 (H) 38 - 126 U/L   Total Bilirubin 0.8 0.3 - 1.2 mg/dL   GFR calc non Af Amer >60 >60 mL/min   GFR calc Af Amer >60 >60 mL/min    Comment: (NOTE) The eGFR has been calculated  using the CKD EPI equation. This calculation has not been validated in all clinical situations. eGFR's persistently <60 mL/min signify possible Chronic Kidney Disease.    Anion gap 11 5 - 15  CBC with Differential     Status: Abnormal   Collection Time: 10/12/17  5:24 PM  Result Value Ref Range   WBC 9.2 3.8 - 10.6 K/uL   RBC 5.14 4.40 - 5.90 MIL/uL   Hemoglobin 15.1 13.0 - 18.0 g/dL   HCT 44.0 40.0 - 52.0 %   MCV 85.8 80.0 - 100.0 fL   MCH 29.5 26.0 - 34.0 pg   MCHC 34.4 32.0 - 36.0 g/dL   RDW 14.5 11.5 - 14.5 %   Platelets 480 (H) 150 - 440 K/uL   Neutrophils Relative % 82 %   Neutro Abs 7.6 (H) 1.4 - 6.5 K/uL   Lymphocytes Relative 7 %   Lymphs Abs 0.6 (L) 1.0 - 3.6 K/uL   Monocytes Relative 10 %   Monocytes Absolute 0.9 0.2 - 1.0 K/uL   Eosinophils Relative 0 %   Eosinophils Absolute 0.0 0 - 0.7 K/uL   Basophils Relative 1 %   Basophils Absolute 0.1 0 - 0.1 K/uL  Blood culture (routine x 2)     Status: None (Preliminary result)   Collection Time: 10/12/17  5:24 PM  Result Value Ref Range   Specimen Description BLOOD RIGHT FORE ARM    Special Requests      BOTTLES DRAWN AEROBIC AND ANAEROBIC Blood Culture adequate volume   Culture NO GROWTH < 12 HOURS     Report Status PENDING   Lactic acid, plasma     Status: Abnormal   Collection Time: 10/12/17  5:25 PM  Result Value Ref Range   Lactic Acid, Venous 2.0 (HH) 0.5 - 1.9 mmol/L    Comment: CRITICAL RESULT CALLED TO, READ BACK BY AND VERIFIED WITH MONICA MOON 10/12/17 1811 KLW   Lactic acid, plasma     Status: None   Collection Time: 10/12/17 11:19 PM  Result Value Ref Range   Lactic Acid, Venous 0.9 0.5 - 1.9 mmol/L  Blood culture (routine x 2)     Status: None (Preliminary result)   Collection Time: 10/12/17 11:19 PM  Result Value Ref Range   Specimen Description BLOOD LEFT ARM    Special Requests      BOTTLES DRAWN AEROBIC AND ANAEROBIC Blood Culture adequate volume   Culture NO GROWTH < 12 HOURS    Report Status PENDING   CBC     Status: Abnormal   Collection Time: 10/13/17  3:34 AM  Result Value Ref Range   WBC 8.1 3.8 - 10.6 K/uL   RBC 4.46 4.40 - 5.90 MIL/uL   Hemoglobin 13.0 13.0 - 18.0 g/dL   HCT 38.1 (L) 40.0 - 52.0 %   MCV 85.5 80.0 - 100.0 fL   MCH 29.1 26.0 - 34.0 pg   MCHC 34.1 32.0 - 36.0 g/dL   RDW 14.4 11.5 - 14.5 %   Platelets 442 (H) 150 - 440 K/uL  Basic metabolic panel     Status: Abnormal   Collection Time: 10/13/17  3:34 AM  Result Value Ref Range   Sodium 129 (L) 135 - 145 mmol/L   Potassium 3.3 (L) 3.5 - 5.1 mmol/L   Chloride 101 101 - 111 mmol/L   CO2 19 (L) 22 - 32 mmol/L   Glucose, Bld 92 65 - 99 mg/dL   BUN 7 6 - 20 mg/dL  Creatinine, Ser 0.58 (L) 0.61 - 1.24 mg/dL   Calcium 6.8 (L) 8.9 - 10.3 mg/dL   GFR calc non Af Amer >60 >60 mL/min   GFR calc Af Amer >60 >60 mL/min    Comment: (NOTE) The eGFR has been calculated using the CKD EPI equation. This calculation has not been validated in all clinical situations. eGFR's persistently <60 mL/min signify possible Chronic Kidney Disease.    Anion gap 9 5 - 15   Dg Chest 2 View  Result Date: 10/12/2017 CLINICAL DATA:  71 year old male with history of bladder cancer and right upper  lobectomy presents with overall weakness, dizziness and hypotension. EXAM: CHEST  2 VIEW COMPARISON:  Head CT 09/09/2017 PE FINDINGS: Postoperative volume loss of the right lung status post right upper lobectomy. Emphysematous hyperinflation of the remaining lungs. No alveolar consolidation, effusion or pneumothorax. 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 Cardiac silhouette is normal in size. No aortic aneurysm. Subtle T8 and T9 vertebral body lucencies may reflect known changes of osseous metastasis. IMPRESSION: 1. Postop appearance of the right lung with volume loss and scarring status post right upper lobectomy. 2. Hyperinflated lungs without pneumonic consolidation or CHF. 3. Faint lucencies of the T8 and T9 vertebral bodies may reflect known changes of osseous metastatic disease better characterized on prior PET-CT. Electronically Signed   By: Ashley Royalty M.D.   On: 10/12/2017 18:08   Dg Abdomen Acute W/chest  Result Date: 10/12/2017 CLINICAL DATA:  Weakness, lightheadedness, hypotension and abdominal discomfort. EXAM: DG ABDOMEN ACUTE W/ 1V CHEST COMPARISON:  PET-CT 09/09/2017 FINDINGS: The abdominal gas pattern is negative for obstruction or perforation. No biliary or urinary calculi are evident. The upright view of the chest is negative for significant cardiopulmonary abnormality. IMPRESSION: Negative abdominal radiographs.  No acute cardiopulmonary disease. Electronically Signed   By: Andreas Newport M.D.   On: 10/12/2017 19:18    Assessment:  The patient is a 71 y.o.  gentleman with metastatic bladder cancer who was admitted with generalized weakness, poor oral intake, weight loss, and electrolyte abnormalities.  PET scan on 09/09/2017 revealed several hypermetabolic mixed lytic and sclerotic osseous metastases throughout the axial skeleton.  There was suspected intramuscular soft tissue metastases in the right quadratus lumborum and posterior  left lumbar paraspinal musculature.  There was mildly hypermetabolic retroperitoneal lymphadenopathy, probably representing nodal metastases.  There was diffuse irregular bladder wall thickening, asymmetrically prominent in the right bladder wall, consistent with bladder neoplasm.  Symptomatically, he is feeling better.  He is beginning to eat.  He denies nausea.  Plan:   1.  Oncology:  Patient with metastatic bladder cancer.  He wishes to continue palliative radiation.  Dr Baruch Gouty came by today.  Anticipate radiation tomorrow.  If he is able to feel stronger, he would like to consider chemotherapy in the future.  2.  Fluids/Electrolytes/Nutrition:  Patient improving with IVF and electrolyte supplementation.  Sodium 126 to 129.  Potassium 3.3.  Patient has met with nutrition.  Megace 200 mg a day to stimulate appetite.  3.  GI:  Opiod induced constipation relieved.  Patient received Relistor.  4.  Pain and Toxicology:  Pain well controlled.  Neck pain improving with palliative radiation.  5.  Disposition:  Anticipate ongoing improvement over the next 2-3 days.  Agree with PT consult.  Anticipate follow-up in clinic next week after discharge.   Thank you for allowing me to participate in Patrick Hooper 's care.  I will  follow him closely with you while hospitalized and after discharge in the outpatient department.   Lequita Asal, MD  10/13/2017, 8:36 PM

## 2017-10-13 NOTE — Care Management Note (Addendum)
Case Management Note  Patient Details  Name: Patrick Hooper MRN: [id number] Date of Birth: [birth year]  Subjective/Objective:    Admitted to [facility] under observation status with the diagnosis of weakness. Lives with friend [name] [phone]). Sister is [name] [phone]). Last was at [facility] 2018. States he sees a different physician each time he goes to the [facility].  Prescriptions are filled at [facility] of all basic and instrumental activities of daily living himself. No falls. Decreased appetite. Lost 20 pounds since August. 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            Action/Plan: Discussed transferring to [facility].  [name] is observation status at this time. States he doesn't want to transfer. Physical therapy evaluation completed. Recommending therapy in the home. [facility] is the contracted home health agency for [facility], [name], RN representative updated.   Expected Discharge Date:                  Expected Discharge Plan:     In-House Referral:   yes  Discharge planning Services     Post Acute Care Choice:   [facility] Choice offered to:   Patient  DME Arranged:    DME Agency:     HH Arranged:   yes HH Agency:   [facility]  Status of Service:     If discussed at [place] of Stay Meetings, dates discussed:    Additional Comments:  Shelbie Ammons, RN MSN CCM Care Management [phone] 10/13/2017, 9:42 AM

## 2017-10-13 NOTE — Care Management Obs Status (Signed)
[facility] NOTIFICATION   Patient Details  Name: Patrick Hooper MRN: [id number] Date of Birth: May 24, 1946   Medicare Observation Status Notification Given:  Yes    Shelbie Ammons, RN 10/13/2017, 9:41 AM

## 2017-10-13 NOTE — Plan of Care (Signed)
VSS, free of falls during shift.  Reported chest pain 5/10, improved to 3/10 w/ PRN PO Oxy 5mg .  Reported cough, received PRN PO Robitussin DM 100-10mg .  Emesis after ingesting Robitussin syrup, received PRN IV Zofran 4mg .  No other needs overnight.  Bed in low position, call bell within reach.  WCTM.

## 2017-10-14 ENCOUNTER — Telehealth (INDEPENDENT_AMBULATORY_CARE_PROVIDER_SITE_OTHER): Payer: Self-pay | Admitting: Vascular Surgery

## 2017-10-14 ENCOUNTER — Ambulatory Visit: Payer: Non-veteran care

## 2017-10-14 ENCOUNTER — Inpatient Hospital Stay: Payer: Non-veteran care

## 2017-10-14 ENCOUNTER — Other Ambulatory Visit: Payer: Self-pay | Admitting: Urgent Care

## 2017-10-14 ENCOUNTER — Telehealth: Payer: Self-pay | Admitting: *Deleted

## 2017-10-14 LAB — BASIC METABOLIC PANEL
Anion gap: 6 (ref 5–15)
BUN: 6 mg/dL (ref 6–20)
CO2: 20 mmol/L — ABNORMAL LOW (ref 22–32)
Calcium: 7.1 mg/dL — ABNORMAL LOW (ref 8.9–10.3)
Chloride: 104 mmol/L (ref 101–111)
Creatinine, Ser: 0.52 mg/dL — ABNORMAL LOW (ref 0.61–1.24)
Glucose, Bld: 92 mg/dL (ref 65–99)
POTASSIUM: 3.6 mmol/L (ref 3.5–5.1)
SODIUM: 130 mmol/L — AB (ref 135–145)

## 2017-10-14 LAB — CBC
HCT: 35.6 % — ABNORMAL LOW (ref 40.0–52.0)
Hemoglobin: 12.2 g/dL — ABNORMAL LOW (ref 13.0–18.0)
MCH: 28.9 pg (ref 26.0–34.0)
MCHC: 34.3 g/dL (ref 32.0–36.0)
MCV: 84.5 fL (ref 80.0–100.0)
PLATELETS: 408 10*3/uL (ref 150–440)
RBC: 4.22 MIL/uL — AB (ref 4.40–5.90)
RDW: 14.1 % (ref 11.5–14.5)
WBC: 7.4 10*3/uL (ref 3.8–10.6)

## 2017-10-14 MED ORDER — OXYCODONE HCL 5 MG PO CAPS: 5.0000 mg | ORAL_CAPSULE | ORAL | 0 refills | Status: DC | PRN

## 2017-10-14 MED ORDER — LACTULOSE 10 GM/15ML PO SOLN
40.0000 g | ORAL | Status: AC
  Administered 2017-10-14: 12:00:00 40 g via ORAL
  Filled 2017-10-14: qty 60

## 2017-10-14 MED ORDER — BISACODYL 10 MG RE SUPP: 10.0000 mg | Freq: Every day | RECTAL | 0 refills | Status: DC | PRN

## 2017-10-14 MED ORDER — PREMIER PROTEIN SHAKE: 11.0000 [oz_av] | Freq: Two times a day (BID) | ORAL | 0 refills | Status: AC

## 2017-10-14 MED ORDER — NICOTINE 21 MG/24HR TD PT24
21.0000 mg | MEDICATED_PATCH | Freq: Every day | TRANSDERMAL | Status: DC
  Administered 2017-10-14 (×2): 21 mg via TRANSDERMAL
  Filled 2017-10-14 (×2): qty 1

## 2017-10-14 MED ORDER — METHYLNALTREXONE BROMIDE 12 MG/0.6ML ~~LOC~~ SOLN
12.0000 mg | SUBCUTANEOUS | Status: AC
  Administered 2017-10-14: 12 mg via SUBCUTANEOUS
  Filled 2017-10-14: qty 0.6

## 2017-10-14 MED ORDER — ONDANSETRON HCL 4 MG PO TABS: 4.0000 mg | ORAL_TABLET | Freq: Four times a day (QID) | ORAL | 0 refills | Status: AC | PRN

## 2017-10-14 MED ORDER — FLEET ENEMA 7-19 GM/118ML RE ENEM: 1.0000 | ENEMA | Freq: Once | RECTAL | Status: DC

## 2017-10-14 MED ORDER — POLYETHYLENE GLYCOL 3350 17 G PO PACK: 17.0000 g | PACK | Freq: Every day | ORAL | 0 refills | Status: AC

## 2017-10-14 MED ORDER — LACTULOSE 10 GM/15ML PO SOLN: 30.0000 g | Freq: Two times a day (BID) | ORAL | 0 refills | Status: AC | PRN

## 2017-10-14 MED ORDER — POLYETHYLENE GLYCOL 3350 17 G PO PACK: 17.0000 g | PACK | Freq: Every day | ORAL | Status: DC

## 2017-10-14 MED ORDER — OCUVITE-LUTEIN PO CAPS: 1.0000 | ORAL_CAPSULE | Freq: Every day | ORAL | 0 refills | Status: DC

## 2017-10-14 MED ORDER — BISACODYL 10 MG RE SUPP
10.0000 mg | Freq: Every day | RECTAL | Status: DC | PRN
  Administered 2017-10-14: 10 mg via RECTAL
  Filled 2017-10-14: qty 1

## 2017-10-14 NOTE — Telephone Encounter (Signed)
Called the patient back to let him know that he is currently not scheduled in our office or at the hospital on 10/17/17.

## 2017-10-14 NOTE — Telephone Encounter (Signed)
No action needed at this time.

## 2017-10-14 NOTE — Telephone Encounter (Signed)
New Message  Pts wife verbalized pt is suppose to have a port removal on Monday but I do not see anything but the wife wanted to cancel the procedure due to pt is cancelling his Chemo Treatment due to pt being in ED currently and is very weak.  Please f/u if needed

## 2017-10-14 NOTE — Progress Notes (Signed)
Patient discharged home with family via private vehicle. Single PIV removed before discharge. Discharge education completed with patient and family, all question answered. Prescriptions given to patient and notified that other prescriptions were sent to local pharmacy electronically.

## 2017-10-14 NOTE — Discharge Summary (Signed)
[name] [place] NAME: Patrick Hooper    MR#:  [id number]  DATE OF BIRTH:  29-Jan-1946  DATE OF ADMISSION:  10/12/2017 ADMITTING PHYSICIAN: Dustin Flock, MD  DATE OF DISCHARGE: 10/14/2017  PRIMARY CARE PHYSICIAN: [facility]    ADMISSION DIAGNOSIS:  Dehydration [E86.0] Lactic acidosis [E87.2] Hyponatremia [E87.1]  DISCHARGE DIAGNOSIS:  Active Problems:   Weakness   Pressure injury of skin   Hypotension   SECONDARY DIAGNOSIS:   Past Medical History:  Diagnosis Date  . Bladder cancer ([place])   . Hypertension   . Lung cancer ([place]) 2016  . PVD (peripheral vascular disease) ([place])     HOSPITAL COURSE:   1.  Hypotension with slight orthostasis.  Patient feeling much better and wanted to go home today.  We continued IV fluid hydration during the hospital course.  Patient feeling stronger. 2.  Hypokalemia this was replaced and the IV fluids.  Since the patient is eating better, I think we do not have to give replacement as outpatient. 3.  Hyponatremia and dehydration.  Sodium a little bit better today. 4.  History of bladder cancer, history of lung cancer.  Bone metastases.  Appreciate oncology consultation.  Follow-up as outpatient. 5.  Constipation.  Patient was given 2 doses of Relistor.  Patient also given lactulose.  Patient had a bowel movement after Dulcolax suppository.  PRN medications at home. 6.  Moderate malnutrition.  Protein supplement given. 7.  History of peripheral vascular disease on aspirin and Plavix. 8.  Home health set up   DISCHARGE CONDITIONS:   Satisfactory  CONSULTS OBTAINED:  Treatment Team:  Lloyd Huger, MD Lequita Asal, MD  DRUG ALLERGIES:   Allergies  Allergen Reactions  . Cefazolin Rash and Shortness Of Breath  . Carvedilol     Other reaction(s): Unknown  . Chlorthalidone     Other reaction(s): Unknown  . Hydralazine     Other reaction(s): Unknown  .  Hydrochlorothiazide Other (See Comments)    DISCHARGE MEDICATIONS:   Allergies as of 10/14/2017      Reactions   Cefazolin Rash, Shortness Of Breath   Carvedilol    Other reaction(s): Unknown   Chlorthalidone    Other reaction(s): Unknown   Hydralazine    Other reaction(s): Unknown   Hydrochlorothiazide Other (See Comments)      Medication List    STOP taking these medications   ibuprofen 200 MG tablet Commonly known as:  ADVIL,MOTRIN     TAKE these medications   aspirin 81 MG chewable tablet Chew 81 mg daily by mouth.   bisacodyl 10 MG suppository Commonly known as:  DULCOLAX Place 1 suppository (10 mg total) rectally daily as needed for moderate constipation.   clopidogrel 75 MG tablet Commonly known as:  PLAVIX Take 75 mg daily by mouth.   docusate sodium 100 MG capsule Commonly known as:  COLACE Take 100 mg 2 (two) times daily by mouth.   lactulose 10 GM/15ML solution Commonly known as:  CHRONULAC Take 45 mLs (30 g total) by mouth 2 (two) times daily as needed for mild constipation or severe constipation.   megestrol 40 MG/ML suspension Commonly known as:  MEGACE ORAL Take 5 mLs (200 mg total) by mouth daily.   mirtazapine 15 MG tablet Commonly known as:  REMERON Take 15 mg at bedtime by mouth.   morphine 15 MG tablet Commonly known as:  MSIR Take 15 mg by mouth every 12 (twelve)  hours as needed for moderate pain or severe pain.   multivitamin-lutein Caps capsule Take 1 capsule by mouth daily. Start taking on:  10/15/2017   ondansetron 4 MG tablet Commonly known as:  ZOFRAN Take 1 tablet (4 mg total) by mouth every 6 (six) hours as needed for nausea.   oxycodone 5 MG capsule Commonly known as:  OXY-IR Take 1 capsule (5 mg total) by mouth every 4 (four) hours as needed for pain.   polyethylene glycol packet Commonly known as:  MIRALAX / GLYCOLAX Take 17 g by mouth daily. Start taking on:  10/15/2017   protein supplement shake Liqd Commonly  known as:  PREMIER PROTEIN Take 325 mLs (11 oz total) by mouth 2 (two) times daily between meals.        DISCHARGE INSTRUCTIONS:   Follow-up PMD 1 week Follow-up with Dr. Mike Gip next week  If you experience worsening of your admission symptoms, develop shortness of breath, life threatening emergency, suicidal or homicidal thoughts you must seek medical attention immediately by calling 911 or calling your MD immediately  if symptoms less severe.  You Must read complete instructions/literature along with all the possible adverse reactions/side effects for all the Medicines you take and that have been prescribed to you. Take any new Medicines after you have completely understood and accept all the possible adverse reactions/side effects.   Please note  You were cared for by a hospitalist during your hospital stay. If you have any questions about your discharge medications or the care you received while you were in the hospital after you are discharged, you can call the unit and asked to speak with the hospitalist on call if the hospitalist that took care of you is not available. Once you are discharged, your primary care physician will handle any further medical issues. Please note that NO REFILLS for any discharge medications will be authorized once you are discharged, as it is imperative that you return to your primary care physician (or establish a relationship with a primary care physician if you do not have one) for your aftercare needs so that they can reassess your need for medications and monitor your lab values.    Today   CHIEF COMPLAINT:   Chief Complaint  Patient presents with  . Weakness  . Dizziness    HISTORY OF PRESENT ILLNESS:  Patrick Hooper  is a 71 y.o. adult that came in came in with weakness and dizziness.  Patient states he has had constipation.  He has not been able to eat.   VITAL SIGNS:  Blood pressure 108/62, pulse 92, temperature 97.8 F (36.6 C),  temperature source Oral, resp. rate 16, height 5\' 11"  (1.803 m), weight 58 kg (127 lb 14.4 oz), SpO2 100 %.    PHYSICAL EXAMINATION:  GENERAL:  71 y.o.-year-old patient lying in the bed with no acute distress.  EYES: Pupils equal, round, reactive to light and accommodation. No scleral icterus. Extraocular muscles intact.  HEENT: Head atraumatic, normocephalic. Oropharynx and nasopharynx clear.  NECK:  Supple, no jugular venous distention. No thyroid enlargement, no tenderness.  LUNGS: Normal breath sounds bilaterally, no wheezing, rales,rhonchi or crepitation. No use of accessory muscles of respiration.  CARDIOVASCULAR: S1, S2 normal. No murmurs, rubs, or gallops.  ABDOMEN: Soft, non-tender, non-distended. Bowel sounds present. No organomegaly or mass.  EXTREMITIES: No pedal edema, cyanosis, or clubbing.  NEUROLOGIC: Cranial nerves II through XII are intact. Muscle strength 5/5 in all extremities. Sensation intact. Gait not checked.  PSYCHIATRIC: The  patient is alert and oriented x 3.  SKIN: No obvious rash, lesion, or ulcer.   DATA REVIEW:   CBC Recent Labs  Lab 10/14/17 0451  WBC 7.4  HGB 12.2*  HCT 35.6*  PLT 408    Chemistries  Recent Labs  Lab 10/12/17 1724  10/14/17 0451  NA 126*   < > 130*  K 3.8   < > 3.6  CL 95*   < > 104  CO2 20*   < > 20*  GLUCOSE 130*   < > 92  BUN 9   < > 6  CREATININE 0.67   < > 0.52*  CALCIUM 7.6*   < > 7.1*  AST 21  --   --   ALT 11*  --   --   ALKPHOS 140*  --   --   BILITOT 0.8  --   --    < > = values in this interval not displayed.     Microbiology Results  Results for orders placed or performed during the hospital encounter of 10/12/17  Blood culture (routine x 2)     Status: None (Preliminary result)   Collection Time: 10/12/17  5:24 PM  Result Value Ref Range Status   Specimen Description BLOOD RIGHT FORE ARM  Final   Special Requests   Final    BOTTLES DRAWN AEROBIC AND ANAEROBIC Blood Culture adequate volume   Culture  NO GROWTH 2 DAYS  Final   Report Status PENDING  Incomplete  Blood culture (routine x 2)     Status: None (Preliminary result)   Collection Time: 10/12/17 11:19 PM  Result Value Ref Range Status   Specimen Description BLOOD LEFT ARM  Final   Special Requests   Final    BOTTLES DRAWN AEROBIC AND ANAEROBIC Blood Culture adequate volume   Culture NO GROWTH 2 DAYS  Final   Report Status PENDING  Incomplete    RADIOLOGY:  Dg Chest 2 View  Result Date: 10/12/2017 CLINICAL DATA:  71 year old male with history of bladder cancer and right upper lobectomy presents with overall weakness, dizziness and hypotension. EXAM: CHEST  2 VIEW COMPARISON:  Head CT 09/09/2017 PE FINDINGS: Postoperative volume loss of the right lung status post right upper lobectomy. Emphysematous hyperinflation of the remaining lungs. No alveolar consolidation, effusion or pneumothorax. 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 Cardiac silhouette is normal in size. No aortic aneurysm. Subtle T8 and T9 vertebral body lucencies may reflect known changes of osseous metastasis. IMPRESSION: 1. Postop appearance of the right lung with volume loss and scarring status post right upper lobectomy. 2. Hyperinflated lungs without pneumonic consolidation or CHF. 3. Faint lucencies of the T8 and T9 vertebral bodies may reflect known changes of osseous metastatic disease better characterized on prior PET-CT. Electronically Signed   By: Ashley Royalty M.D.   On: 10/12/2017 18:08   Dg Abdomen Acute W/chest  Result Date: 10/12/2017 CLINICAL DATA:  Weakness, lightheadedness, hypotension and abdominal discomfort. EXAM: DG ABDOMEN ACUTE W/ 1V CHEST COMPARISON:  PET-CT 09/09/2017 FINDINGS: The abdominal gas pattern is negative for obstruction or perforation. No biliary or urinary calculi are evident. The upright view of the chest is negative for significant cardiopulmonary abnormality. IMPRESSION: Negative  abdominal radiographs.  No acute cardiopulmonary disease. Electronically Signed   By: Andreas Newport M.D.   On: 10/12/2017 19:18       Management plans discussed with the patient, and he is in agreement.  CODE  STATUS:     Code Status Orders  (From admission, onward)        Start     Ordered   10/12/17 2355  Full code  Continuous     10/12/17 2354    Code Status History    Date Active Date Inactive Code Status Order ID Comments User Context   This patient has a current code status but no historical code status.      TOTAL TIME TAKING CARE OF THIS PATIENT: 35 minutes.    Loletha Grayer M.D on 10/14/2017 at 3:09 PM  Between 7am to 6pm - Pager - [phone]  After 6pm go to [email or web address] - Proofreader  [facility] Office  [phone]  CC: Primary care physician; [facility] [place]

## 2017-10-16 ENCOUNTER — Encounter: Payer: Self-pay | Admitting: Hematology and Oncology

## 2017-10-16 NOTE — Progress Notes (Signed)
START ON PATHWAY REGIMEN - Bladder     A cycle is every 21 days:     Gemcitabine      Cisplatin   **Always confirm dose/schedule in your pharmacy ordering system**    Patient Characteristics: Metastatic Disease, First Line, No Prior Neoadjuvant/Adjuvant Therapy, Good Renal Function (CrCl ? 50 mL/min) AJCC M Category: M1b AJCC N Category: N3 AJCC T Category: T2 Current evidence of distant metastases<= Yes AJCC 8 Stage Grouping: IVB Line of Therapy: First Line Would you be surprised if this patient died  in the next year<= I would be surprised if this patient died in the next year Prior Neoadjuvant/Adjuvant Therapy<= No Renal Function: Good Renal Function (CrCl ? 50 mL/min) Intent of Therapy: Non-Curative / Palliative Intent, Discussed with Patient

## 2017-10-17 ENCOUNTER — Ambulatory Visit: Payer: Non-veteran care

## 2017-10-17 LAB — CULTURE, BLOOD (ROUTINE X 2)
CULTURE: NO GROWTH
Culture: NO GROWTH
SPECIAL REQUESTS: ADEQUATE
SPECIAL REQUESTS: ADEQUATE

## 2017-10-18 ENCOUNTER — Ambulatory Visit: Payer: Non-veteran care

## 2017-10-18 ENCOUNTER — Ambulatory Visit
Admission: RE | Admit: 2017-10-18 | Discharge: 2017-10-18 | Disposition: A | Discharge status: Home / Self Care | Payer: Non-veteran care | Source: Ambulatory Visit | Attending: Radiation Oncology | Admitting: Radiation Oncology

## 2017-10-19 ENCOUNTER — Ambulatory Visit
Admission: RE | Admit: 2017-10-19 | Discharge: 2017-10-19 | Disposition: A | Discharge status: Home / Self Care | Payer: Non-veteran care | Source: Ambulatory Visit | Attending: Radiation Oncology | Admitting: Radiation Oncology

## 2017-10-19 ENCOUNTER — Encounter: Payer: Self-pay | Admitting: *Deleted

## 2017-10-19 ENCOUNTER — Ambulatory Visit: Payer: Non-veteran care

## 2017-10-20 ENCOUNTER — Ambulatory Visit
Admission: RE | Admit: 2017-10-20 | Discharge: 2017-10-20 | Disposition: A | Discharge status: Home / Self Care | Payer: Non-veteran care | Source: Ambulatory Visit | Attending: Radiation Oncology | Admitting: Radiation Oncology

## 2017-10-20 ENCOUNTER — Other Ambulatory Visit (INDEPENDENT_AMBULATORY_CARE_PROVIDER_SITE_OTHER): Payer: Self-pay | Admitting: Vascular Surgery

## 2017-10-24 ENCOUNTER — Telehealth: Payer: Self-pay | Admitting: Urgent Care

## 2017-10-24 ENCOUNTER — Other Ambulatory Visit: Payer: Self-pay | Admitting: Urgent Care

## 2017-10-24 ENCOUNTER — Telehealth: Payer: Self-pay | Admitting: *Deleted

## 2017-10-24 MED ORDER — MORPHINE SULFATE 15 MG PO TABS: 15.0000 mg | ORAL_TABLET | Freq: Two times a day (BID) | ORAL | 0 refills | Status: DC | PRN

## 2017-10-24 MED ORDER — OXYCODONE HCL 5 MG PO CAPS: 5.0000 mg | ORAL_CAPSULE | ORAL | 0 refills | Status: DC | PRN

## 2017-10-24 MED ORDER — MORPHINE SULFATE ER 15 MG PO TBCR
15.0000 mg | EXTENDED_RELEASE_TABLET | Freq: Two times a day (BID) | ORAL | 0 refills | Status: DC
Start: 2017-10-24 — End: 2017-11-11

## 2017-10-24 NOTE — Telephone Encounter (Signed)
Patient called and asked for "what he was on" to be refilled. Patient has been using MS Contin at home per the wife's report, however it appears as if it was entered incorrectly on the medication list here. The MS IR was discontinued, and a new prescription for MS ER was sent in with a clarification note to the pharmacy. Patient should be taking:  1. Morphine Sulfate ER (MS Contin) 15mg  PO BID  2. Roxicodone (Oxycodone IR) 5mg  PO q6 PRN

## 2017-10-24 NOTE — Telephone Encounter (Signed)
Pharmacy called to inquire if Patrick Hooper is to have 2 short acting narcotics , or if the MS IR is to be MS ER? Please advise

## 2017-10-24 NOTE — Telephone Encounter (Signed)
Spouse called to report that patient is running low on his pain medications. Given a current oncological diagnosis, this patient has the potential to experience significant pain related to his cancer. Pain 7/10 at present. Patient encouraged to use long acting medication first, and to add the short acting PRN. Patient has been doing the opposite. He was encouraged to maintain a pain diary for provider review. I reviewed that the goal is for him to require less short acting medication for breakthrough as his pain is manages with the long acting. Benefits versus risks associated with continued therapy considered. Will continue pain management with opioids as previously prescribed. Reinforced eduction surrounding the fact that medications should not be bitten, chewed, or crushed. Additionally, safety precautions reviewed. Verbalized understanding that medications should not be sold or shared, taken with alcohol, or used while driving.   Refill Rx sent in for: 1. MS Contin 15 mg PO BID (Disp #30) 2. Roxicodone mg PO q6 PRN (Disp #30)

## 2017-10-25 MED ORDER — CLINDAMYCIN PHOSPHATE 300 MG/50ML IV SOLN: 300.0000 mg | Freq: Once | INTRAVENOUS | Status: DC

## 2017-10-26 ENCOUNTER — Encounter: Admission: RE | Payer: Self-pay | Source: Ambulatory Visit

## 2017-10-26 ENCOUNTER — Telehealth: Payer: Self-pay | Admitting: *Deleted

## 2017-10-26 ENCOUNTER — Ambulatory Visit: Admission: RE | Admit: 2017-10-26 | Payer: Non-veteran care | Source: Ambulatory Visit | Admitting: Vascular Surgery

## 2017-10-26 SURGERY — PORTA CATH INSERTION
Anesthesia: Moderate Sedation

## 2017-10-26 NOTE — Telephone Encounter (Signed)
Ms Phillip Heal called to report that patient did not get his port today, it was cancelled

## 2017-10-26 NOTE — Progress Notes (Signed)
[facility] day:  10/27/2017  Chief Complaint: Patrick Hooper is a 71 y.o. adult with metastatic bladder cancer who is seen for assessment prior cycle #1 of cisplatin and gemcitabine.  HPI:  The patient was last seen in the medical oncology clinic on  10/12/2017 by Faythe Casa, NP.  At that time, patient felt "terrible". Patient was very weak, had a poor appetite, and was experiencing nausea, vomiting, and constipation. Patient was having difficulties completing his normal ADLs secondary to weakness. Patient with vertiginous symptoms noted with position changes. Patient short of breath with exertion. Patient was noted be tachycardic to 120s, and therefore he was sent to the ED for further evaluation and admission to the hospital.  Patient was admitted to [facility] from 10/12/2017 - 10/14/2017 for generalized weakness, hyponatremia, and lactic acidosis. Patient noted that he had increasing weakness and poor oral intake for 2 weeks. Of note, patient had been prescribed appetite stimulant (Megace), however he had not filled the prescription.  Initial labs demonstrated a WBC 9200 with an [place] of 7600. Hemoglobin was 15.1, hematocrit 44.0, and platelets 480,000. Patient was hyponatremic at 126. Lactic acid was elevated at 2.0. Patient had opioid-induced constipation during his admission. Patient received IV hydration and electrolyte (sodium and potassium) replacement during his admission. Patient was discharged home in stable condition following an uncomplicated admission course on 10/14/2017. Labs repeated on the day of discharge revealing a WBC of 7400, hemoglobin 12.2, hematocrit 35.6, and platelets 408,000. Sodium had improved to 130 and potassium normal at 3.6. Renal function was stable (BUN 6 and creatinine 0.52).  He started radiation therapy treatments on 09/28/2017. Plans were to deliver 3000 cGy in 10 fractions. Due to hospitalization because patient to  miss treatment, his radiation therapy completion date has had to be extended. Completed radiation therapy on 10/20/2017.  During the interim, patient has had "breathing problems and dizziness".  Patient with orthostatic changes noted with position changes. His blood pressure drops and his heart rate increases significantly with position changes. Patient denies nausea, vomiting, and diarrhea. He has been eating and drinking "ok". Weight remains stable. Family notes that patient is doing "much better" with regards to his intake, however he "could do better".  He denies and fevers of sweats.   Patient's pain has improved. He is taking his MS Contin as scheduled and only requiring 1 or 2 Oxycodone IR a day.    Past Medical History:  Diagnosis Date  . Bladder cancer ([place])   . Hypertension   . Lung cancer ([place]) 2016  . PVD (peripheral vascular disease) ([place])     Past Surgical History:  Procedure Laterality Date  . BLADDER SURGERY  08/12/2017  . BYPASS GRAFT FEMORAL/POPLITEAL W/VEIN  2009  . COLONOSCOPY  2013  . CYSTOSCOPY  08/12/2017  . IR ABDOMINAL AORTOBIFEMORAL CATHETER SERIALOGRAM  2009  . LOBECTOMY Right 09/2015  . PACEMAKER INSERTION  2003    Family History  Problem Relation Age of Onset  . Cancer Mother   . Cancer Sister   . Cancer Maternal Grandmother     Social History:  reports that he has been smoking cigarettes.  He has a 55.00 pack-year smoking history. he has never used smokeless tobacco. He reports that he drinks alcohol. He reports that he does not use drugs.  He smokes 1 pack a day for > 55 years.  He used to drink 4 alcoholic beverages/day.  Now his alcohol intake is infrequent. 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 There is the potential of past exposure to radiation and/or toxins. He is registered with the [place] regarding his exposures.  He lives in [place].  The patient is accompanied by his girlfriend Optometrist)  today.  Allergies:  Allergies  Allergen Reactions  . Cefazolin Rash and Shortness Of Breath  . Carvedilol Other (See Comments)    fatigue  . Chlorthalidone Other (See Comments)    Other reaction(s): Unknown  . Hydralazine     Other reaction(s): Unknown  . Hydrochlorothiazide Other (See Comments)    Current Medications: Current Outpatient Medications  Medication Sig Dispense Refill  . aspirin EC 81 MG tablet Take 81 mg by mouth at bedtime.    . bisacodyl (DULCOLAX) 10 MG suppository Place 1 suppository (10 mg total) rectally daily as needed for moderate constipation. 12 suppository 0  . clopidogrel (PLAVIX) 75 MG tablet Take 75 mg by mouth at bedtime.     . docusate sodium (COLACE) 100 MG capsule Take 100 mg 2 (two) times daily by mouth.    . megestrol (MEGACE ORAL) 40 MG/ML suspension Take 5 mLs (200 mg total) by mouth daily. 240 mL 0  . mirtazapine (REMERON) 15 MG tablet Take 15 mg at bedtime by mouth.    . morphine (MS CONTIN) 15 MG 12 hr tablet Take 1 tablet (15 mg total) by mouth every 12 (twelve) hours. 30 tablet 0  . oxycodone (OXY-IR) 5 MG capsule Take 1 capsule (5 mg total) by mouth every 4 (four) hours as needed for pain. 30 capsule 0  . acetaminophen (TYLENOL) 500 MG tablet Take 500 mg by mouth every 6 (six) hours as needed (for pain).    Marland Kitchen lactulose (CHRONULAC) 10 GM/15ML solution Take 45 mLs (30 g total) by mouth 2 (two) times daily as needed for mild constipation or severe constipation. (Patient not taking: Reported on 10/27/2017) 1892 mL 0  . multivitamin-lutein (OCUVITE-LUTEIN) CAPS capsule Take 1 capsule by mouth daily. (Patient not taking: Reported on 10/27/2017) 30 capsule 0  . ondansetron (ZOFRAN) 4 MG tablet Take 1 tablet (4 mg total) by mouth every 6 (six) hours as needed for nausea. (Patient not taking: Reported on 10/27/2017) 20 tablet 0  . polyethylene glycol (MIRALAX / GLYCOLAX) packet Take 17 g by mouth daily. (Patient not taking: Reported on 10/27/2017) 30  each 0  . protein supplement shake (PREMIER PROTEIN) LIQD Take 325 mLs (11 oz total) by mouth 2 (two) times daily between meals. (Patient not taking: Reported on 10/27/2017) 60 Can 0   No current facility-administered medications for this visit.    Facility-Administered Medications Ordered in Other Visits  Medication Dose Route Frequency Provider Last Rate Last Dose  . heparin lock flush 100 unit/mL  500 Units Intravenous Once Corcoran, Melissa C, MD      . sodium chloride flush (NS) 0.9 % injection 10 mL  10 mL Intravenous Once Lequita Asal, MD        Review of Systems:  GENERAL:  Feels "ok, but really dizzy".  No fever or sweats.  Weight loss of 10-15 pounds in the last 1-2 months; weight stable since last visit.  PERFORMANCE STATUS (ECOG):  2 HEENT:  No visual changes, sore throat, mouth sores or tenderness. Lungs: Shortness of breath with exertion.  Cough.  No hemoptysis. Cardiac:  Implanted pacemaker (NOT MRI COMPATIBLE). No chest pain, palpitations, orthopnea, or PND.  Blood pressure fluctuates.  Off  amlodipine. GI:  Constipation secondary to narcotics.  No nausea, vomiting, diarrhea, melena or hematochezia. GU:  Dribbling.  Urgency, frequency, dysuria and nocturia.  No hematuria. Musculoskeletal:  Neck pain.  Bone pain (lower ribs, shoulders, back, neck, shoulder).  Joint pain (left wrist and right elbow). No muscle tenderness. Extremities:  No pain or swelling. Skin:  No rashes or skin changes. Neuro:  Minor headache.  4th digit of left hand change in sensation.  No numbness or weakness, balance or coordination issues. Endocrine:  No diabetes, thyroid issues, hot flashes or night sweats. Psych:  No mood changes, depression or anxiety. Pain:  No current pain. Review of systems:  All other systems reviewed and found to be negative.  Physical Exam: Blood pressure 123/85, pulse (!) 122, temperature (!) 96.9 F (36.1 C), temperature source Tympanic, resp. rate 20, weight 127  lb (57.6 kg). GENERAL:  Well developed, well nourished, gentleman sitting comfortably in the exam room in no acute distress. MENTAL STATUS:  Alert and oriented to person, place and time. HEAD:  Thin gray hair.  Normocephalic, atraumatic, face symmetric, no Cushingoid features. EYES:  Blue eyes.  Pupils equal round and reactive to light and accomodation.  No conjunctivitis or scleral icterus. ENT:  Oropharynx clear without lesion.  Tongue normal.  Upper dentures.  No lower teeth.  Mucous membranes moist.  NECK:  Well healed scar right sided scar. CHEST:  Left sided pacemaker. RESPIRATORY:  Clear to auscultation without rales, wheezes or rhonchi. CARDIOVASCULAR:  Regular rate and rhythm without murmur, rub or gallop. ABDOMEN:  Soft, non-tender, with active bowel sounds, and no hepatosplenomegaly.  No masses. BACK:  Focal areas of pain in upper and lower thoracic spine and L2-L3. SKIN:  No rashes, ulcers or lesions. EXTREMITIES: Nicotine stained nails.  No edema, no skin discoloration or tenderness.  No palpable cords. LYMPH NODES: No palpable cervical, supraclavicular, axillary or inguinal adenopathy  NEUROLOGICAL: Unremarkable. PSYCH:  Appropriate.   Infusion on 10/27/2017  Component Date Value Ref Range Status  . Sodium 10/27/2017 130* 135 - 145 mmol/L Final  . Potassium 10/27/2017 3.7  3.5 - 5.1 mmol/L Final  . Chloride 10/27/2017 98* 101 - 111 mmol/L Final  . CO2 10/27/2017 23  22 - 32 mmol/L Final  . Glucose, Bld 10/27/2017 113* 65 - 99 mg/dL Final  . BUN 10/27/2017 10  6 - 20 mg/dL Final  . Creatinine, Ser 10/27/2017 0.73  0.61 - 1.24 mg/dL Final  . Calcium 10/27/2017 8.5* 8.9 - 10.3 mg/dL Final  . Total Protein 10/27/2017 7.0  6.5 - 8.1 g/dL Final  . Albumin 10/27/2017 2.9* 3.5 - 5.0 g/dL Final  . AST 10/27/2017 29  15 - 41 U/L Final  . ALT 10/27/2017 21  17 - 63 U/L Final  . Alkaline Phosphatase 10/27/2017 140* 38 - 126 U/L Final  . Total Bilirubin 10/27/2017 0.5  0.3 - 1.2  mg/dL Final  . GFR calc non Af Amer 10/27/2017 >60  >60 mL/min Final  . GFR calc Af Amer 10/27/2017 >60  >60 mL/min Final   Comment: (NOTE) The eGFR has been calculated using the CKD EPI equation. This calculation has not been validated in all clinical situations. eGFR's persistently <60 mL/min signify possible Chronic Kidney Disease.   Georgiann Hahn gap 10/27/2017 9  5 - 15 Final   Performed at [facility], [address] [place], [place] [address]  . WBC 10/27/2017 7.9  3.8 - 10.6 K/uL Final  . RBC 10/27/2017 4.70  4.40 - 5.90 MIL/uL Final  . Hemoglobin 10/27/2017 13.7  13.0 - 18.0 g/dL Final  . HCT 10/27/2017 39.4* 40.0 - 52.0 % Final  . MCV 10/27/2017 83.9  80.0 - 100.0 fL Final  . MCH 10/27/2017 29.1  26.0 - 34.0 pg Final  . MCHC 10/27/2017 34.7  32.0 - 36.0 g/dL Final  . RDW 10/27/2017 14.7* 11.5 - 14.5 % Final  . Platelets 10/27/2017 390  150 - 440 K/uL Final  . Neutrophils Relative % 10/27/2017 72  % Final  . Neutro Abs 10/27/2017 5.7  1.4 - 6.5 K/uL Final  . Lymphocytes Relative 10/27/2017 12  % Final  . Lymphs Abs 10/27/2017 1.0  1.0 - 3.6 K/uL Final  . Monocytes Relative 10/27/2017 12  % Final  . Monocytes Absolute 10/27/2017 0.9  0.2 - 1.0 K/uL Final  . Eosinophils Relative 10/27/2017 3  % Final  . Eosinophils Absolute 10/27/2017 0.2  0 - 0.7 K/uL Final  . Basophils Relative 10/27/2017 1  % Final  . Basophils Absolute 10/27/2017 0.1  0 - 0.1 K/uL Final   Performed at [facility], [address], [place], [place] [address]    Assessment:  Traeger Sultana is a 71 y.o. adult with metastatic disease.  He has a recent history of muscle invasive bladder cancer and a distant history of lung cancer.  He has a history of stage IB lung cancer status post right upper lobe lobectomy in 09/2015.  Pathology revealed a [id number] adenocarcinoma.  Chest CT on 05/25/2017 revealed postoperative changes from a right upper lobe lobectomy without evidence of recurrence. There  was new diffuse punctate sclerotic osseous lesions throughout the chest concerning for metastatic disease.  [facility] on 06/08/2017 at [facility] revealed right upper lobe lobectomy without evidence of recurrence. There was a new 3.2 cm mass along the right inferior bladder wall, anterior to the ureteral vesicular junction.  There were multiple new focal sclerotic and some probable mixed lytic and sclerotic osseous lesions in the axial skeleton many of which demonstrated increased FDG avidity. Differential diagnoses included include metastatic lung cancer, bladder cancer or prostate cancer. There was increased FDG uptake in the left medial thigh musculature of uncertain etiology.  PET scan on 09/09/2017 revealed several hypermetabolic mixed lytic and sclerotic osseous metastases throughout the axial skeleton.  There was suspected intramuscular soft tissue metastases in the right quadratus lumborum and posterior left lumbar paraspinal musculature.  There was mildly hypermetabolic retroperitoneal lymphadenopathy, probably representing nodal metastases.  There was diffuse irregular bladder wall thickening, asymmetrically prominent in the right bladder wall, suspicious for residual/recurrent bladder neoplasm.   There was nonspecific small ground-glass nodular opacity in the left upper lung lobe with low level metabolism.  There was no evidence of local tumor recurrence in the right lung status post right upper lobectomy   He underwent transurethral resection of bladder tumor (TURBT) with bilateral retrograde pyelogram on 08/12/2017.  He had a 3 cm bladder tumor on the right lateral wall with some sessile and papillary features.  Retrograde pyelograms were normal without filling defect.  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  He began monthly Xgeva on 09/16/2017.  He has a history of peripheral vascular disease s/p fem-fem bypass in 2016. He is on Plavix.   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  Colonoscopy was normal in 2011.  Symptomatically, patient is very dizzy. He has a chronic cough and shortness  of breath s/p lung resection. He notes sensation changes in his left hand.  He continues to smoke on a daily basis. Pain is well controlled.  Exam reveals dehydration and orthostatic changes.  Plan: 1.  Labs today:  CBC with diff, CMP, LDH, Mg, cortisol 2.  Review treatment for bladder cancer. Discuss change in treatment from initially discussed regimen. Discuss unlikely tolerance for cisplatin.  Discuss switch to carboplatin.  Discuss carboplatin + gemcitabine.  Patient is not a candidate for bladder resection secondary to metastatic disease.  Side effects of medications reviewed. Patient agrees to postponing therapy until hypotension and tachycardia resolved.  Discuss port placement.  Follow-up on coverage by [place] injection was due on 10/14/2017, however patient was admitted. Calcium 8.5 today (corrected calcium 9.4). Orders in place for patient to receive Xgeva injection today. 4.  Discuss pain management. Patient asked to maintain a pain diary. Discuss using long acting medication (MS-Contin) first, with Oxycodone IR being used for breakthrough.  5.  Discuss HYPOtension and vertiginous symptoms.  Sodium low at 130. Will give patient 1L NS today. He will return on 10/28/2017 and 10/31/2017 for 1L NS.  6.  Refer to neurology for reduced sensation in hand.  7.  Encourage calcium 1200 mg and vitamin D 800 IU daily. 8.  Preauthorize new chemo regimen. Switching cisplatin + gemcitabine to carboplatin + gemcitabine.  9.  RTC on 11/03/2017 for MD assessment, labs (CBC with diff, CMP, Mg, LDH), day 1 of cycle #1 carboplatin + gemcitabine (new).    Honor Loh, NP  10/27/2017, 9:11 AM   I saw and evaluated the patient, participating in the key portions of the service and reviewing pertinent diagnostic  studies and records.  I reviewed the nurse practitioner's note and agree with the findings and the plan.  The assessment and plan were discussed with the patient.  Several questions were asked by the patient and answered.   Nolon Stalls, MD 10/27/2017, 9:11 AM

## 2017-10-27 ENCOUNTER — Inpatient Hospital Stay: Payer: Non-veteran care

## 2017-10-27 ENCOUNTER — Other Ambulatory Visit: Payer: Self-pay

## 2017-10-27 ENCOUNTER — Telehealth: Payer: Self-pay | Admitting: *Deleted

## 2017-10-27 ENCOUNTER — Inpatient Hospital Stay (HOSPITAL_BASED_OUTPATIENT_CLINIC_OR_DEPARTMENT_OTHER): Payer: Non-veteran care | Admitting: Hematology and Oncology

## 2017-10-27 VITALS — BP 132/80 | HR 109

## 2017-10-27 VITALS — BP 123/85 | HR 122 | Temp 96.9°F | Resp 20 | Wt 127.0 lb

## 2017-10-27 DIAGNOSIS — R5383 Other fatigue: Secondary | ICD-10-CM | POA: Diagnosis not present

## 2017-10-27 DIAGNOSIS — G893 Neoplasm related pain (acute) (chronic): Secondary | ICD-10-CM

## 2017-10-27 DIAGNOSIS — I739 Peripheral vascular disease, unspecified: Secondary | ICD-10-CM

## 2017-10-27 DIAGNOSIS — R05 Cough: Secondary | ICD-10-CM | POA: Diagnosis not present

## 2017-10-27 DIAGNOSIS — R630 Anorexia: Secondary | ICD-10-CM

## 2017-10-27 DIAGNOSIS — C7951 Secondary malignant neoplasm of bone: Secondary | ICD-10-CM

## 2017-10-27 DIAGNOSIS — M542 Cervicalgia: Secondary | ICD-10-CM | POA: Diagnosis not present

## 2017-10-27 DIAGNOSIS — C679 Malignant neoplasm of bladder, unspecified: Secondary | ICD-10-CM | POA: Diagnosis present

## 2017-10-27 DIAGNOSIS — G5690 Unspecified mononeuropathy of unspecified upper limb: Secondary | ICD-10-CM

## 2017-10-27 DIAGNOSIS — I10 Essential (primary) hypertension: Secondary | ICD-10-CM

## 2017-10-27 DIAGNOSIS — Z7189 Other specified counseling: Secondary | ICD-10-CM

## 2017-10-27 DIAGNOSIS — Z85118 Personal history of other malignant neoplasm of bronchus and lung: Secondary | ICD-10-CM | POA: Diagnosis not present

## 2017-10-27 DIAGNOSIS — R000 Tachycardia, unspecified: Secondary | ICD-10-CM

## 2017-10-27 DIAGNOSIS — Z923 Personal history of irradiation: Secondary | ICD-10-CM

## 2017-10-27 DIAGNOSIS — M899 Disorder of bone, unspecified: Secondary | ICD-10-CM | POA: Diagnosis not present

## 2017-10-27 DIAGNOSIS — C672 Malignant neoplasm of lateral wall of bladder: Secondary | ICD-10-CM

## 2017-10-27 DIAGNOSIS — Z79899 Other long term (current) drug therapy: Secondary | ICD-10-CM | POA: Diagnosis not present

## 2017-10-27 DIAGNOSIS — R634 Abnormal weight loss: Secondary | ICD-10-CM | POA: Diagnosis not present

## 2017-10-27 DIAGNOSIS — E860 Dehydration: Secondary | ICD-10-CM

## 2017-10-27 DIAGNOSIS — R0602 Shortness of breath: Secondary | ICD-10-CM

## 2017-10-27 DIAGNOSIS — R42 Dizziness and giddiness: Secondary | ICD-10-CM | POA: Diagnosis not present

## 2017-10-27 DIAGNOSIS — F17210 Nicotine dependence, cigarettes, uncomplicated: Secondary | ICD-10-CM

## 2017-10-27 DIAGNOSIS — Z809 Family history of malignant neoplasm, unspecified: Secondary | ICD-10-CM

## 2017-10-27 DIAGNOSIS — Z7982 Long term (current) use of aspirin: Secondary | ICD-10-CM

## 2017-10-27 DIAGNOSIS — K5903 Drug induced constipation: Secondary | ICD-10-CM | POA: Diagnosis not present

## 2017-10-27 DIAGNOSIS — I951 Orthostatic hypotension: Secondary | ICD-10-CM

## 2017-10-27 DIAGNOSIS — I959 Hypotension, unspecified: Secondary | ICD-10-CM | POA: Diagnosis not present

## 2017-10-27 DIAGNOSIS — R112 Nausea with vomiting, unspecified: Secondary | ICD-10-CM | POA: Diagnosis not present

## 2017-10-27 DIAGNOSIS — T402X5A Adverse effect of other opioids, initial encounter: Secondary | ICD-10-CM | POA: Diagnosis not present

## 2017-10-27 LAB — CBC WITH DIFFERENTIAL/PLATELET
Basophils Absolute: 0.1 10*3/uL (ref 0–0.1)
Basophils Relative: 1 %
Eosinophils Absolute: 0.2 10*3/uL (ref 0–0.7)
Eosinophils Relative: 3 %
HCT: 39.4 % — ABNORMAL LOW (ref 40.0–52.0)
Hemoglobin: 13.7 g/dL (ref 13.0–18.0)
Lymphocytes Relative: 12 %
Lymphs Abs: 1 10*3/uL (ref 1.0–3.6)
MCH: 29.1 pg (ref 26.0–34.0)
MCHC: 34.7 g/dL (ref 32.0–36.0)
MCV: 83.9 fL (ref 80.0–100.0)
Monocytes Absolute: 0.9 10*3/uL (ref 0.2–1.0)
Monocytes Relative: 12 %
Neutro Abs: 5.7 10*3/uL (ref 1.4–6.5)
Neutrophils Relative %: 72 %
Platelets: 390 10*3/uL (ref 150–440)
RBC: 4.7 MIL/uL (ref 4.40–5.90)
RDW: 14.7 % — ABNORMAL HIGH (ref 11.5–14.5)
WBC: 7.9 10*3/uL (ref 3.8–10.6)

## 2017-10-27 LAB — COMPREHENSIVE METABOLIC PANEL
ALT: 21 U/L (ref 17–63)
AST: 29 U/L (ref 15–41)
Albumin: 2.9 g/dL — ABNORMAL LOW (ref 3.5–5.0)
Alkaline Phosphatase: 140 U/L — ABNORMAL HIGH (ref 38–126)
Anion gap: 9 (ref 5–15)
BUN: 10 mg/dL (ref 6–20)
CO2: 23 mmol/L (ref 22–32)
Calcium: 8.5 mg/dL — ABNORMAL LOW (ref 8.9–10.3)
Chloride: 98 mmol/L — ABNORMAL LOW (ref 101–111)
Creatinine, Ser: 0.73 mg/dL (ref 0.61–1.24)
GFR calc Af Amer: >60 mL/min (ref >60)
GFR calc non Af Amer: >60 mL/min (ref >60)
Glucose, Bld: 113 mg/dL — ABNORMAL HIGH (ref 65–99)
Potassium: 3.7 mmol/L (ref 3.5–5.1)
Sodium: 130 mmol/L — ABNORMAL LOW (ref 135–145)
Total Bilirubin: 0.5 mg/dL (ref 0.3–1.2)
Total Protein: 7 g/dL (ref 6.5–8.1)

## 2017-10-27 LAB — LACTATE DEHYDROGENASE: LDH: 177 U/L (ref 98–192)

## 2017-10-27 LAB — MAGNESIUM: MAGNESIUM: 1.8 mg/dL (ref 1.7–2.4)

## 2017-10-27 LAB — CORTISOL: Cortisol, Plasma: 9.7 ug/dL

## 2017-10-27 MED ORDER — DENOSUMAB 120 MG/1.7ML ~~LOC~~ SOLN
120.0000 mg | Freq: Once | SUBCUTANEOUS | Status: AC
  Administered 2017-10-27: 120 mg via SUBCUTANEOUS
  Filled 2017-10-27: qty 1.7

## 2017-10-27 MED ORDER — SODIUM CHLORIDE 0.9 % IV SOLN
Freq: Once | INTRAVENOUS | Status: AC
  Administered 2017-10-27: 10:00:00 via INTRAVENOUS
  Filled 2017-10-27: qty 1000

## 2017-10-27 MED ORDER — SODIUM CHLORIDE 0.9% FLUSH
10.0000 mL | Freq: Once | INTRAVENOUS | Status: DC
  Filled 2017-10-27: qty 10

## 2017-10-27 MED ORDER — HEPARIN SOD (PORK) LOCK FLUSH 100 UNIT/ML IV SOLN
500.0000 [IU] | Freq: Once | INTRAVENOUS | Status: DC
Start: 2017-10-27 — End: 2017-10-27

## 2017-10-27 NOTE — Telephone Encounter (Signed)
Called patient to make him aware of his appt for 10/28/17 for 1L NS  He is aware of time. All other appt were scheduled as requested.

## 2017-10-27 NOTE — Telephone Encounter (Signed)
We are seeing Patrick Hooper this morning (10/27/2017). Will will discuss the pot issues at this time. Will have to discuss with Dr. Mike Gip, but in [place], he could receive his initial cycle peripherally while we wait on the approval to have his port placed. My only concern with that approach is that chemo may have negative effects on him, just as radiation did, that would delay getting the port placed, which would ultimately delay his treatment. Again, I will discuss with Dr. Mike Gip before seeing him. Based on her recommendations, we will develop a plan for going forward.

## 2017-10-27 NOTE — Progress Notes (Signed)
Here for follow up. Per pt has been having SOB ( walking 20 ft he stated -needs to sit down ) lightheaded and afraid he may fall ,high pulse,erratic BP (takes it at home he stated )  Pulse noted to be 122 manually taken today.  Repeat vs -105/72, p-117 Standing -74/52  p  132  Dr Mike Gip informed

## 2017-10-28 ENCOUNTER — Inpatient Hospital Stay: Payer: Non-veteran care

## 2017-10-28 VITALS — BP 137/79 | HR 80 | Temp 97.8°F | Resp 20

## 2017-10-28 DIAGNOSIS — C7951 Secondary malignant neoplasm of bone: Secondary | ICD-10-CM

## 2017-10-28 DIAGNOSIS — C679 Malignant neoplasm of bladder, unspecified: Secondary | ICD-10-CM | POA: Diagnosis not present

## 2017-10-28 MED ORDER — SODIUM CHLORIDE 0.9 % IV SOLN
Freq: Once | INTRAVENOUS | Status: AC
  Administered 2017-10-28: 500 mL/h via INTRAVENOUS
  Filled 2017-10-28: qty 1000

## 2017-10-28 MED ORDER — SODIUM CHLORIDE 0.9% FLUSH
10.0000 mL | INTRAVENOUS | Status: DC | PRN
  Administered 2017-10-28: 10 mL
  Filled 2017-10-28: qty 10

## 2017-10-30 ENCOUNTER — Encounter: Payer: Self-pay | Admitting: Hematology and Oncology

## 2017-10-31 ENCOUNTER — Inpatient Hospital Stay: Payer: Non-veteran care

## 2017-10-31 ENCOUNTER — Other Ambulatory Visit: Payer: Self-pay

## 2017-10-31 VITALS — BP 127/83 | HR 93 | Temp 98.3°F | Resp 20

## 2017-10-31 DIAGNOSIS — C3411 Malignant neoplasm of upper lobe, right bronchus or lung: Secondary | ICD-10-CM

## 2017-10-31 DIAGNOSIS — C672 Malignant neoplasm of lateral wall of bladder: Secondary | ICD-10-CM

## 2017-10-31 DIAGNOSIS — E860 Dehydration: Secondary | ICD-10-CM

## 2017-10-31 DIAGNOSIS — C679 Malignant neoplasm of bladder, unspecified: Secondary | ICD-10-CM | POA: Diagnosis not present

## 2017-10-31 MED ORDER — SODIUM CHLORIDE 0.9 % IV SOLN
INTRAVENOUS | Status: DC
  Administered 2017-10-31: 14:00:00 via INTRAVENOUS
  Filled 2017-10-31 (×2): qty 1000

## 2017-10-31 MED ORDER — SODIUM CHLORIDE 0.9% FLUSH
10.0000 mL | Freq: Once | INTRAVENOUS | Status: AC
  Administered 2017-10-31: 10 mL via INTRAVENOUS
  Filled 2017-10-31: qty 10

## 2017-11-03 ENCOUNTER — Inpatient Hospital Stay: Payer: Medicare Other

## 2017-11-03 ENCOUNTER — Other Ambulatory Visit: Payer: Self-pay

## 2017-11-03 ENCOUNTER — Inpatient Hospital Stay (HOSPITAL_BASED_OUTPATIENT_CLINIC_OR_DEPARTMENT_OTHER): Payer: Medicare Other | Admitting: Hematology and Oncology

## 2017-11-03 ENCOUNTER — Inpatient Hospital Stay: Payer: Medicare Other | Attending: Hematology and Oncology

## 2017-11-03 VITALS — BP 129/82 | HR 112 | Temp 95.3°F | Resp 18 | Wt 130.9 lb

## 2017-11-03 DIAGNOSIS — I739 Peripheral vascular disease, unspecified: Secondary | ICD-10-CM | POA: Diagnosis not present

## 2017-11-03 DIAGNOSIS — I471 Supraventricular tachycardia: Secondary | ICD-10-CM | POA: Insufficient documentation

## 2017-11-03 DIAGNOSIS — R0602 Shortness of breath: Secondary | ICD-10-CM

## 2017-11-03 DIAGNOSIS — C672 Malignant neoplasm of lateral wall of bladder: Secondary | ICD-10-CM | POA: Insufficient documentation

## 2017-11-03 DIAGNOSIS — R7989 Other specified abnormal findings of blood chemistry: Secondary | ICD-10-CM | POA: Insufficient documentation

## 2017-11-03 DIAGNOSIS — C7951 Secondary malignant neoplasm of bone: Secondary | ICD-10-CM | POA: Diagnosis not present

## 2017-11-03 DIAGNOSIS — Z7982 Long term (current) use of aspirin: Secondary | ICD-10-CM

## 2017-11-03 DIAGNOSIS — Z809 Family history of malignant neoplasm, unspecified: Secondary | ICD-10-CM | POA: Diagnosis not present

## 2017-11-03 DIAGNOSIS — E860 Dehydration: Secondary | ICD-10-CM | POA: Insufficient documentation

## 2017-11-03 DIAGNOSIS — I10 Essential (primary) hypertension: Secondary | ICD-10-CM | POA: Diagnosis not present

## 2017-11-03 DIAGNOSIS — R001 Bradycardia, unspecified: Secondary | ICD-10-CM

## 2017-11-03 DIAGNOSIS — Z800 Family history of malignant neoplasm of digestive organs: Secondary | ICD-10-CM | POA: Diagnosis not present

## 2017-11-03 DIAGNOSIS — R918 Other nonspecific abnormal finding of lung field: Secondary | ICD-10-CM | POA: Diagnosis not present

## 2017-11-03 DIAGNOSIS — Z79899 Other long term (current) drug therapy: Secondary | ICD-10-CM

## 2017-11-03 DIAGNOSIS — K5900 Constipation, unspecified: Secondary | ICD-10-CM | POA: Diagnosis not present

## 2017-11-03 DIAGNOSIS — R42 Dizziness and giddiness: Secondary | ICD-10-CM

## 2017-11-03 DIAGNOSIS — Z5111 Encounter for antineoplastic chemotherapy: Secondary | ICD-10-CM | POA: Insufficient documentation

## 2017-11-03 DIAGNOSIS — F17210 Nicotine dependence, cigarettes, uncomplicated: Secondary | ICD-10-CM | POA: Diagnosis not present

## 2017-11-03 DIAGNOSIS — R634 Abnormal weight loss: Secondary | ICD-10-CM | POA: Insufficient documentation

## 2017-11-03 DIAGNOSIS — I959 Hypotension, unspecified: Secondary | ICD-10-CM | POA: Diagnosis not present

## 2017-11-03 DIAGNOSIS — G893 Neoplasm related pain (acute) (chronic): Secondary | ICD-10-CM | POA: Insufficient documentation

## 2017-11-03 DIAGNOSIS — R338 Other retention of urine: Secondary | ICD-10-CM | POA: Insufficient documentation

## 2017-11-03 DIAGNOSIS — R531 Weakness: Secondary | ICD-10-CM | POA: Insufficient documentation

## 2017-11-03 DIAGNOSIS — Z7189 Other specified counseling: Secondary | ICD-10-CM

## 2017-11-03 LAB — COMPREHENSIVE METABOLIC PANEL
ALBUMIN: 3 g/dL — AB (ref 3.5–5.0)
ALK PHOS: 138 U/L — AB (ref 38–126)
ALT: 15 U/L — ABNORMAL LOW (ref 17–63)
ANION GAP: 10 (ref 5–15)
AST: 26 U/L (ref 15–41)
BUN: 8 mg/dL (ref 6–20)
CO2: 20 mmol/L — AB (ref 22–32)
Calcium: 8.2 mg/dL — ABNORMAL LOW (ref 8.9–10.3)
Chloride: 102 mmol/L (ref 101–111)
Creatinine, Ser: 0.7 mg/dL (ref 0.61–1.24)
GFR calc Af Amer: >60 mL/min (ref >60)
GFR calc non Af Amer: >60 mL/min (ref >60)
Glucose, Bld: 94 mg/dL (ref 65–99)
POTASSIUM: 3.8 mmol/L (ref 3.5–5.1)
SODIUM: 132 mmol/L — AB (ref 135–145)
Total Bilirubin: 0.5 mg/dL (ref 0.3–1.2)
Total Protein: 6.9 g/dL (ref 6.5–8.1)

## 2017-11-03 LAB — CBC WITH DIFFERENTIAL/PLATELET
Basophils Absolute: 0.1 10*3/uL (ref 0–0.1)
Basophils Relative: 1 %
EOS PCT: 2 %
Eosinophils Absolute: 0.2 10*3/uL (ref 0–0.7)
HEMATOCRIT: 38.3 % — AB (ref 40.0–52.0)
Hemoglobin: 13 g/dL (ref 13.0–18.0)
LYMPHS PCT: 20 %
Lymphs Abs: 1.7 10*3/uL (ref 1.0–3.6)
MCH: 28.8 pg (ref 26.0–34.0)
MCHC: 34 g/dL (ref 32.0–36.0)
MCV: 84.7 fL (ref 80.0–100.0)
MONOS PCT: 10 %
Monocytes Absolute: 0.9 10*3/uL (ref 0.2–1.0)
NEUTROS ABS: 5.8 10*3/uL (ref 1.4–6.5)
Neutrophils Relative %: 67 %
PLATELETS: 503 10*3/uL — AB (ref 150–440)
RBC: 4.52 MIL/uL (ref 4.40–5.90)
RDW: 15.5 % — ABNORMAL HIGH (ref 11.5–14.5)
WBC: 8.7 10*3/uL (ref 3.8–10.6)

## 2017-11-03 LAB — LACTATE DEHYDROGENASE: LDH: 175 U/L (ref 98–192)

## 2017-11-03 MED ORDER — PALONOSETRON HCL INJECTION 0.25 MG/5ML
0.2500 mg | Freq: Once | INTRAVENOUS | Status: AC
  Administered 2017-11-03: 0.25 mg via INTRAVENOUS
  Filled 2017-11-03: qty 5

## 2017-11-03 MED ORDER — DEXAMETHASONE 4 MG PO TABS: 8.0000 mg | ORAL_TABLET | Freq: Every day | ORAL | 1 refills | Status: DC

## 2017-11-03 MED ORDER — LORAZEPAM 0.5 MG PO TABS: 0.5000 mg | ORAL_TABLET | Freq: Four times a day (QID) | ORAL | 0 refills | Status: DC | PRN

## 2017-11-03 MED ORDER — DEXAMETHASONE SODIUM PHOSPHATE 10 MG/ML IJ SOLN
10.0000 mg | Freq: Once | INTRAMUSCULAR | Status: AC
  Administered 2017-11-03: 10 mg via INTRAVENOUS
  Filled 2017-11-03: qty 1

## 2017-11-03 MED ORDER — SODIUM CHLORIDE 0.9 % IV SOLN
405.0000 mg | Freq: Once | INTRAVENOUS | Status: AC
  Administered 2017-11-03: 410 mg via INTRAVENOUS
  Filled 2017-11-03: qty 41

## 2017-11-03 MED ORDER — ONDANSETRON HCL 8 MG PO TABS: 8.0000 mg | ORAL_TABLET | Freq: Two times a day (BID) | ORAL | 1 refills | Status: DC | PRN

## 2017-11-03 MED ORDER — SODIUM CHLORIDE 0.9 % IV SOLN: 10.0000 mg | Freq: Once | INTRAVENOUS | Status: DC

## 2017-11-03 MED ORDER — SODIUM CHLORIDE 0.9 % IV SOLN
1000.0000 mg/m2 | Freq: Once | INTRAVENOUS | Status: AC
  Administered 2017-11-03: 1710 mg via INTRAVENOUS
  Filled 2017-11-03: qty 23.93

## 2017-11-03 MED ORDER — SODIUM CHLORIDE 0.9 % IV SOLN
Freq: Once | INTRAVENOUS | Status: AC
  Administered 2017-11-03: 11:00:00 via INTRAVENOUS
  Filled 2017-11-03: qty 1000

## 2017-11-03 NOTE — Patient Instructions (Signed)
Need to start taking Calcium 1200 mg and vitamin D 800 IU daily.

## 2017-11-03 NOTE — Progress Notes (Signed)
Here for follow up. stataed constipated w meds, p 112 manually taken. Pt stated started to have urinary leakage -wearing depends.

## 2017-11-03 NOTE — Progress Notes (Signed)
[facility] day:  11/03/2017  Chief Complaint: Patrick Hooper is a 72 y.o. adult with metastatic bladder cancer who is seen for assessment prior cycle #1 of cisplatin and gemcitabine.  HPI:  The patient was last seen in the medical oncology clinic on 10/27/2017.  At that time, he was dizzy. He had a chronic cough and shortness of breath. He noted sensation changes in his left hand.  Pain was well controlled.  Exam revealed dehydration and orthostatic changes.  Sodium was 130.  Cortisol was normal.  He received Xgeva.  He received IVF x 3 days (12/27-12/31/2018).  During the interm, patient is doing "good". He feels much better following the previously administered IVFs. Patient has more energy. He continuous shortness of breath and dizziness. Patient denies nausea. His appetite has appropriately increased with the used of the prescribed Megace. Patient's weight has increased 3 pounds. Patient denies any fevers or sweats. He has not experienced any interval infections.   Patient's blood pressure has improved. His pain is rated at 3/10.   Past Medical History:  Diagnosis Date  . Bladder cancer ([place])   . Hypertension   . Lung cancer ([place]) 2016  . PVD (peripheral vascular disease) ([place])     Past Surgical History:  Procedure Laterality Date  . BLADDER SURGERY  08/12/2017  . BYPASS GRAFT FEMORAL/POPLITEAL W/VEIN  2009  . COLONOSCOPY  2013  . CYSTOSCOPY  08/12/2017  . IR ABDOMINAL AORTOBIFEMORAL CATHETER SERIALOGRAM  2009  . LOBECTOMY Right 09/2015  . PACEMAKER INSERTION  2003    Family History  Problem Relation Age of Onset  . Cancer Mother   . Cancer Sister   . Cancer Maternal Grandmother     Social History:  reports that he has been smoking cigarettes.  He has a 55.00 pack-year smoking history. he has never used smokeless tobacco. He reports that he drinks alcohol. He reports that he does not use drugs.  He smokes 1 pack a day for > 55  years.  He used to drink 4 alcoholic beverages/day.  Now his alcohol intake is infrequent. 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 There is the potential of past exposure to radiation and/or toxins. He is registered with the [place] regarding his exposures.  He lives in [place].  The patient is accompanied by his girlfriend Optometrist) today.  Allergies:  Allergies  Allergen Reactions  . Cefazolin Rash and Shortness Of Breath  . Carvedilol Other (See Comments)    fatigue  . Chlorthalidone Other (See Comments)    Other reaction(s): Unknown  . Hydralazine     Other reaction(s): Unknown  . Hydrochlorothiazide Other (See Comments)    Current Medications: Current Outpatient Medications  Medication Sig Dispense Refill  . aspirin EC 81 MG tablet Take 81 mg by mouth at bedtime.    . clopidogrel (PLAVIX) 75 MG tablet Take 75 mg by mouth at bedtime.     . docusate sodium (COLACE) 100 MG capsule Take 100 mg 2 (two) times daily by mouth.    . megestrol (MEGACE ORAL) 40 MG/ML suspension Take 5 mLs (200 mg total) by mouth daily. 240 mL 0  . mirtazapine (REMERON) 15 MG tablet Take 15 mg at bedtime by mouth.    . morphine (MS CONTIN) 15 MG 12 hr tablet Take 1 tablet (15 mg total) by mouth every 12 (twelve) hours. 30 tablet 0  . polyethylene glycol (  MIRALAX / GLYCOLAX) packet Take 17 g by mouth daily. 30 each 0  . acetaminophen (TYLENOL) 500 MG tablet Take 500 mg by mouth every 6 (six) hours as needed (for pain).    . bisacodyl (DULCOLAX) 10 MG suppository Place 1 suppository (10 mg total) rectally daily as needed for moderate constipation. (Patient not taking: Reported on 11/03/2017) 12 suppository 0  . lactulose (CHRONULAC) 10 GM/15ML solution Take 45 mLs (30 g total) by mouth 2 (two) times daily as needed for mild constipation or severe constipation. (Patient not taking: Reported on 10/27/2017) 1892 mL 0  . multivitamin-lutein (OCUVITE-LUTEIN) CAPS capsule  Take 1 capsule by mouth daily. (Patient not taking: Reported on 11/03/2017) 30 capsule 0  . ondansetron (ZOFRAN) 4 MG tablet Take 1 tablet (4 mg total) by mouth every 6 (six) hours as needed for nausea. (Patient not taking: Reported on 10/27/2017) 20 tablet 0  . oxycodone (OXY-IR) 5 MG capsule Take 1 capsule (5 mg total) by mouth every 4 (four) hours as needed for pain. (Patient not taking: Reported on 11/03/2017) 30 capsule 0  . protein supplement shake (PREMIER PROTEIN) LIQD Take 325 mLs (11 oz total) by mouth 2 (two) times daily between meals. (Patient not taking: Reported on 10/27/2017) 60 Can 0   No current facility-administered medications for this visit.    Facility-Administered Medications Ordered in Other Visits  Medication Dose Route Frequency Provider Last Rate Last Dose  . 0.9 %  sodium chloride infusion   Intravenous Continuous Verlon Au, NP 500 mL/hr at 10/31/17 1400      Review of Systems:  GENERAL:  Feels "ok".  No fever or sweats.  Weight loss of 10-15 pounds in the last 1-2 months; weight up 3 pounds since last visit.  PERFORMANCE STATUS (ECOG):  2 HEENT:  No visual changes, sore throat, mouth sores or tenderness. Lungs: Shortness of breath with exertion.  Cough.  No hemoptysis. Cardiac:  Implanted pacemaker (NOT MRI COMPATIBLE). No chest pain, palpitations, orthopnea, or PND.  Blood pressure fluctuates.  Slowing down helps dizziness.  Off amlodipine. GI:  No nausea, vomiting, diarrhea, constipation, melena or hematochezia. GU:  Dribbling.  Urgency, frequency, dysuria and nocturia.  No hematuria. Musculoskeletal:  Neck pain.  Bone pain (lower ribs, shoulders, back, neck, shoulder).  Joint pain (left wrist and right elbow). No muscle tenderness. Extremities:  No pain or swelling. Skin:  No rashes or skin changes. Neuro:  No headache, numbness or weakness, balance or coordination issues. Endocrine:  No diabetes, thyroid issues, hot flashes or night sweats. Psych:  Worries a  lot.  No mood changes or depression. Pain:  Neck pain 3/10. Review of systems:  All other systems reviewed and found to be negative.  Physical Exam: Blood pressure 129/82, pulse (!) 112, temperature (!) 95.3 F (35.2 C), temperature source Tympanic, resp. rate 18, weight 130 lb 14.4 oz (59.4 kg). GENERAL:  Well developed, well nourished, gentleman sitting comfortably in the exam room in no acute distress. MENTAL STATUS:  Alert and oriented to person, place and time. HEAD:  Thin gray hair.  Normocephalic, atraumatic, face symmetric, no Cushingoid features. EYES:  Blue eyes.  Pupils equal round and reactive to light and accomodation.  No conjunctivitis or scleral icterus. ENT:  Oropharynx clear without lesion.  Tongue normal.  Upper dentures.  No lower teeth.  Mucous membranes moist.  CHEST:  Left sided pacemaker. RESPIRATORY:  Clear to auscultation without rales, wheezes or rhonchi. CARDIOVASCULAR:  Regular rate and rhythm without murmur,  rub or gallop. ABDOMEN:  Soft, non-tender, with active bowel sounds, and no hepatosplenomegaly.  No masses. SKIN:  No rashes, ulcers or lesions. EXTREMITIES: Nicotine stained nails.  No edema, no skin discoloration or tenderness.  No palpable cords. LYMPH NODES: No palpable cervical, supraclavicular, axillary or inguinal adenopathy  NEUROLOGICAL: Unremarkable. PSYCH:  Appropriate.   Appointment on 11/03/2017  Component Date Value Ref Range Status  . LDH 11/03/2017 175  98 - 192 U/L Final   Performed at [facility], [address] [place], [place] [address]  . Sodium 11/03/2017 132* 135 - 145 mmol/L Final  . Potassium 11/03/2017 3.8  3.5 - 5.1 mmol/L Final  . Chloride 11/03/2017 102  101 - 111 mmol/L Final  . CO2 11/03/2017 20* 22 - 32 mmol/L Final  . Glucose, Bld 11/03/2017 94  65 - 99 mg/dL Final  . BUN 11/03/2017 8  6 - 20 mg/dL Final  . Creatinine, Ser 11/03/2017 0.70  0.61 - 1.24 mg/dL Final  . Calcium 11/03/2017 8.2* 8.9 - 10.3 mg/dL  Final  . Total Protein 11/03/2017 6.9  6.5 - 8.1 g/dL Final  . Albumin 11/03/2017 3.0* 3.5 - 5.0 g/dL Final  . AST 11/03/2017 26  15 - 41 U/L Final  . ALT 11/03/2017 15* 17 - 63 U/L Final  . Alkaline Phosphatase 11/03/2017 138* 38 - 126 U/L Final  . Total Bilirubin 11/03/2017 0.5  0.3 - 1.2 mg/dL Final  . GFR calc non Af Amer 11/03/2017 >60  >60 mL/min Final  . GFR calc Af Amer 11/03/2017 >60  >60 mL/min Final   Comment: (NOTE) The eGFR has been calculated using the CKD EPI equation. This calculation has not been validated in all clinical situations. eGFR's persistently <60 mL/min signify possible Chronic Kidney Disease.   Georgiann Hahn gap 11/03/2017 10  5 - 15 Final   Performed at [facility], [address] [place], [place] [address]  . WBC 11/03/2017 8.7  3.8 - 10.6 K/uL Final  . RBC 11/03/2017 4.52  4.40 - 5.90 MIL/uL Final  . Hemoglobin 11/03/2017 13.0  13.0 - 18.0 g/dL Final  . HCT 11/03/2017 38.3* 40.0 - 52.0 % Final  . MCV 11/03/2017 84.7  80.0 - 100.0 fL Final  . MCH 11/03/2017 28.8  26.0 - 34.0 pg Final  . MCHC 11/03/2017 34.0  32.0 - 36.0 g/dL Final  . RDW 11/03/2017 15.5* 11.5 - 14.5 % Final  . Platelets 11/03/2017 503* 150 - 440 K/uL Final  . Neutrophils Relative % 11/03/2017 67  % Final  . Neutro Abs 11/03/2017 5.8  1.4 - 6.5 K/uL Final  . Lymphocytes Relative 11/03/2017 20  % Final  . Lymphs Abs 11/03/2017 1.7  1.0 - 3.6 K/uL Final  . Monocytes Relative 11/03/2017 10  % Final  . Monocytes Absolute 11/03/2017 0.9  0.2 - 1.0 K/uL Final  . Eosinophils Relative 11/03/2017 2  % Final  . Eosinophils Absolute 11/03/2017 0.2  0 - 0.7 K/uL Final  . Basophils Relative 11/03/2017 1  % Final  . Basophils Absolute 11/03/2017 0.1  0 - 0.1 K/uL Final   Performed at [facility], [address], [place], [place] [address]    Assessment:  Kimball Appleby is a 71 y.o. adult with metastatic disease.  He has a recent history of muscle invasive bladder cancer and a  distant history of lung cancer.  He has a history of stage IB lung cancer status post right upper lobe lobectomy in 09/2015.  Pathology revealed a [id number] adenocarcinoma.  Chest CT on 05/25/2017 revealed postoperative changes from a right upper lobe lobectomy without evidence of recurrence. There was new diffuse punctate sclerotic osseous lesions throughout the chest concerning for metastatic disease.  PET scan on 06/08/2017 at the [facility] revealed right upper lobe lobectomy without evidence of recurrence. There was a new 3.2 cm mass along the right inferior bladder wall, anterior to the ureteral vesicular junction.  There were multiple new focal sclerotic and some probable mixed lytic and sclerotic osseous lesions in the axial skeleton many of which demonstrated increased FDG avidity. Differential diagnoses included include metastatic lung cancer, bladder cancer or prostate cancer. There was increased FDG uptake in the left medial thigh musculature of uncertain etiology.  PET scan on 09/09/2017 revealed several hypermetabolic mixed lytic and sclerotic osseous metastases throughout the axial skeleton.  There was suspected intramuscular soft tissue metastases in the right quadratus lumborum and posterior left lumbar paraspinal musculature.  There was mildly hypermetabolic retroperitoneal lymphadenopathy, probably representing nodal metastases.  There was diffuse irregular bladder wall thickening, asymmetrically prominent in the right bladder wall, suspicious for residual/recurrent bladder neoplasm.   There was nonspecific small ground-glass nodular opacity in the left upper lung lobe with low level metabolism.  There was no evidence of local tumor recurrence in the right lung status post right upper lobectomy   He underwent transurethral resection of bladder tumor (TURBT) with bilateral retrograde pyelogram on 08/12/2017.  He had a 3 cm bladder tumor on the right lateral wall with some sessile and  papillary features.  Retrograde pyelograms were normal without filling defect.  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  He began monthly Xgeva on 09/16/2017 (last 10/27/2017).  He has a history of peripheral vascular disease s/p fem-fem bypass in 2016. He is on Plavix.  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  Colonoscopy was normal in 2011.  Symptomatically, patient feeling better overall. His blood pressure is stable. Patient has mild dizziness.  He has a chronic cough and shortness of breath s/p lung resection. Pain is well controlled.  Exam stable. WBC 8700 with ANC of 5800. Hemoglobin is 13.0, hematocrit 38.3, and platelets 503,000. Sodium is 132. Alkaline phosphatase is elevated at 138.   Plan: 1.  Labs today:  CBC with diff, CMP, Mg. 2.  Review treatment for bladder cancer. Discuss change in treatment from initially discussed regimen. Discuss unlikely tolerance for cisplatin.  Discuss switch to carboplatin.  Discuss carboplatin + gemcitabine.  Discuss port placement with Dr. Lucky Cowboy after [facility] approval. 3.  Blood counts stable and adequate for treatment. Patient verbalized understanding of plan and consents to treatment today. Will proceed with day 1 of cycle #1 carboplatin + gemcitabine today.  4.  Discuss pain management. Patient asked to maintain a pain diary. Discuss using long acting medication (MS-Contin) first, with oxycodone IR being used for breakthrough.  5.  Refer to neurology for reduced sensation in hand.  6.  Encourage calcium 1200 mg and vitamin D 800 IU daily. 7.  RTC on 11/10/2017 for MD assessment, labs (CBC with diff, CMP, Mg), day 8 of cycle #1 gemcitabine only. 8.  RTC on 11/24/2017 for MD assessment, labs (CBC with diff, CMP, Mg), and day 1 of cycle #2 carboplatin + gemcitabine.    Honor Loh, NP  11/03/2017, 9:59 AM   I saw and evaluated the patient, participating in the key  portions of the service  and reviewing pertinent diagnostic studies and records.  I reviewed the nurse practitioner's note and agree with the findings and the plan.  The assessment and plan were discussed with the patient.  Several questions were asked by the patient and answered.   Nolon Stalls, MD 11/03/2017, 9:59 AM

## 2017-11-09 ENCOUNTER — Encounter: Payer: Self-pay | Admitting: Family Medicine

## 2017-11-09 ENCOUNTER — Ambulatory Visit (INDEPENDENT_AMBULATORY_CARE_PROVIDER_SITE_OTHER): Payer: Medicare Other | Admitting: Family Medicine

## 2017-11-09 VITALS — BP 110/60 | HR 114 | Temp 97.3°F | Wt 123.0 lb

## 2017-11-09 DIAGNOSIS — C7951 Secondary malignant neoplasm of bone: Secondary | ICD-10-CM | POA: Diagnosis not present

## 2017-11-09 DIAGNOSIS — R531 Weakness: Secondary | ICD-10-CM

## 2017-11-09 DIAGNOSIS — Z7689 Persons encountering health services in other specified circumstances: Secondary | ICD-10-CM | POA: Diagnosis not present

## 2017-11-09 DIAGNOSIS — R634 Abnormal weight loss: Secondary | ICD-10-CM | POA: Diagnosis not present

## 2017-11-09 NOTE — Patient Instructions (Addendum)
Please as Gaspar Bidding or Dr. Patsy Baltimore for a dietician referral to help with food choices/weight loss.  Follow up with me as needed

## 2017-11-09 NOTE — Progress Notes (Signed)
Subjective:    Patient ID: Patrick Hooper, adult    DOB: Mar 05, 1946, 72 y.o.   MRN: [id number]  HPI This is a 72 yo male, accompanied by his girlfriend Patrick Hooper), who presents today to establish care. He had been receiving the majority of his care at the [facility] but needs PCP closer to home. Is currently undergoing chemotherapy for stage 4 bladder cancer at [facility]. He has appointment tomorrow.   Has appointment with OT today. Uses walker at home. No recent falls.   Continues to have frequent lightheadedness, worse with changing positions. Has frequent urination, this makes lightheadedness worse with having to get up and down. Bathroom at far end of house. He is very afraid of falling. Reports that he was orthostatic when last checked. Frequent nocturia. Girlfriend purchased him a urinal yesterday so he won't have to walk to bathroom so frequently. Had several days of IV fluids and had some mild improvement of symptoms.   Poor appetite and fluid intake, taking megace and remeron. He finds it difficult to force himself to eat. No abdominal pain, no vomiting, can't eat large amounts.  Very emotional, does not want to put girlfriend through hell with his illness. Knows his cancer is terminal. Not having significant pain. Having difficulty sleeping.   Past Medical History:  Diagnosis Date  . Bladder cancer ([place])   . Hypertension   . Lung cancer ([place]) 2016  . PVD (peripheral vascular disease) ([place])    Past Surgical History:  Procedure Laterality Date  . BLADDER SURGERY  08/12/2017  . BYPASS GRAFT FEMORAL/POPLITEAL W/VEIN  2009  . COLONOSCOPY  2013  . CYSTOSCOPY  08/12/2017  . IR ABDOMINAL AORTOBIFEMORAL CATHETER SERIALOGRAM  2009  . LOBECTOMY Right 09/2015  . PACEMAKER INSERTION  2003   Family History  Problem Relation Age of Onset  . Cancer Mother   . Cancer Sister   . Cancer Maternal Grandmother    Social History   Tobacco Use  . Smoking status:  Current Every Day Smoker    Packs/day: 1.00    Years: 55.00    Pack years: 55.00    Types: Cigarettes  . Smokeless tobacco: Never Used  Substance Use Topics  . Alcohol use: Yes    Comment: Occasional beer  . Drug use: No      Review of Systems Per HPI    Objective:   Physical Exam  Constitutional: He is oriented to person, place, and time. He appears well-developed and well-nourished.  Thin, frail appearing.   HENT:  Head: Normocephalic and atraumatic.  Cardiovascular: Regular rhythm and normal heart sounds. Tachycardia present.  Pulmonary/Chest: Effort normal and breath sounds normal.  Musculoskeletal: He exhibits no edema.  Neurological: He is alert and oriented to person, place, and time.  Skin: Skin is warm and dry. He is not diaphoretic.  Psychiatric: He has a normal mood and affect. His behavior is normal. Judgment and thought content normal.  Vitals reviewed.     BP 110/60 (BP Location: Right Arm, Patient Position: Sitting, Cuff Size: Normal)   Pulse (!) 114   Temp (!) 97.3 F (36.3 C) (Oral)   Wt 123 lb (55.8 kg)   SpO2 97%   BMI 17.16 kg/m  Wt Readings from Last 3 Encounters:  11/09/17 123 lb (55.8 kg)  11/03/17 130 lb 14.4 oz (59.4 kg)  10/27/17 127 lb (57.6 kg)   Slow gait, unable to walk for prolonged period of time without resting. Pulse ox  90% with ambulation.     Assessment & Plan:  1. Encounter to establish care - Reviewed records in Epic, unfortunate gentleman with metastatic bladder cancer currently undergoing palliative treatment  2. Weakness -likely multifactoral- dehydration, malnourished, some decreased O2 sat with walking - encouraged them to talk with OT about ways to minimize energy exertion as well as safety measures - encouraged increased fluids, energy drinks  3. Bone metastasis ([place]) - follow up as scheduled with oncology tomorrow  4. Weight loss - discussed ways to increase protein intake and encouraged them to ask for cancer  center dietician referral from oncologist tomorrow  Patient and girlfriend deny any needs today and we discussed following up as needed; they are agreeable.   Clarene Reamer, FNP-BC  [facility], [place]  11/09/2017 11:41 PM

## 2017-11-10 ENCOUNTER — Encounter: Payer: Self-pay | Admitting: *Deleted

## 2017-11-10 ENCOUNTER — Inpatient Hospital Stay: Payer: Medicare Other

## 2017-11-10 ENCOUNTER — Other Ambulatory Visit: Payer: Self-pay | Admitting: Hematology and Oncology

## 2017-11-10 ENCOUNTER — Encounter: Payer: Self-pay | Admitting: Hematology and Oncology

## 2017-11-10 ENCOUNTER — Inpatient Hospital Stay (HOSPITAL_BASED_OUTPATIENT_CLINIC_OR_DEPARTMENT_OTHER): Payer: Medicare Other | Admitting: Hematology and Oncology

## 2017-11-10 ENCOUNTER — Other Ambulatory Visit: Payer: Self-pay

## 2017-11-10 VITALS — BP 112/61 | HR 101

## 2017-11-10 VITALS — BP 118/82 | HR 66 | Temp 97.3°F | Resp 24 | Ht 71.0 in | Wt 125.5 lb

## 2017-11-10 DIAGNOSIS — I739 Peripheral vascular disease, unspecified: Secondary | ICD-10-CM | POA: Diagnosis not present

## 2017-11-10 DIAGNOSIS — C672 Malignant neoplasm of lateral wall of bladder: Secondary | ICD-10-CM | POA: Diagnosis not present

## 2017-11-10 DIAGNOSIS — I471 Supraventricular tachycardia: Secondary | ICD-10-CM | POA: Diagnosis not present

## 2017-11-10 DIAGNOSIS — I959 Hypotension, unspecified: Secondary | ICD-10-CM | POA: Diagnosis not present

## 2017-11-10 DIAGNOSIS — Z800 Family history of malignant neoplasm of digestive organs: Secondary | ICD-10-CM

## 2017-11-10 DIAGNOSIS — R0602 Shortness of breath: Secondary | ICD-10-CM

## 2017-11-10 DIAGNOSIS — Z5111 Encounter for antineoplastic chemotherapy: Secondary | ICD-10-CM | POA: Diagnosis not present

## 2017-11-10 DIAGNOSIS — R001 Bradycardia, unspecified: Secondary | ICD-10-CM

## 2017-11-10 DIAGNOSIS — Z7982 Long term (current) use of aspirin: Secondary | ICD-10-CM

## 2017-11-10 DIAGNOSIS — C7951 Secondary malignant neoplasm of bone: Secondary | ICD-10-CM

## 2017-11-10 DIAGNOSIS — G893 Neoplasm related pain (acute) (chronic): Secondary | ICD-10-CM | POA: Diagnosis not present

## 2017-11-10 DIAGNOSIS — R918 Other nonspecific abnormal finding of lung field: Secondary | ICD-10-CM

## 2017-11-10 DIAGNOSIS — I10 Essential (primary) hypertension: Secondary | ICD-10-CM | POA: Diagnosis not present

## 2017-11-10 DIAGNOSIS — R42 Dizziness and giddiness: Secondary | ICD-10-CM | POA: Diagnosis not present

## 2017-11-10 DIAGNOSIS — Z7189 Other specified counseling: Secondary | ICD-10-CM

## 2017-11-10 DIAGNOSIS — F17210 Nicotine dependence, cigarettes, uncomplicated: Secondary | ICD-10-CM | POA: Diagnosis not present

## 2017-11-10 DIAGNOSIS — Z79899 Other long term (current) drug therapy: Secondary | ICD-10-CM

## 2017-11-10 DIAGNOSIS — Z809 Family history of malignant neoplasm, unspecified: Secondary | ICD-10-CM

## 2017-11-10 LAB — CBC WITH DIFFERENTIAL/PLATELET
Basophils Absolute: 0.1 10*3/uL (ref 0–0.1)
Basophils Relative: 2 %
Eosinophils Absolute: 0 10*3/uL (ref 0–0.7)
Eosinophils Relative: 1 %
HCT: 41.3 % (ref 40.0–52.0)
Hemoglobin: 13.9 g/dL (ref 13.0–18.0)
Lymphocytes Relative: 32 %
Lymphs Abs: 1 10*3/uL (ref 1.0–3.6)
MCH: 28.3 pg (ref 26.0–34.0)
MCHC: 33.6 g/dL (ref 32.0–36.0)
MCV: 84.2 fL (ref 80.0–100.0)
Monocytes Absolute: 0.1 10*3/uL — ABNORMAL LOW (ref 0.2–1.0)
Monocytes Relative: 3 %
Neutro Abs: 2 10*3/uL (ref 1.4–6.5)
Neutrophils Relative %: 62 %
Platelets: 166 10*3/uL (ref 150–440)
RBC: 4.91 MIL/uL (ref 4.40–5.90)
RDW: 15.5 % — ABNORMAL HIGH (ref 11.5–14.5)
WBC: 3.3 10*3/uL — ABNORMAL LOW (ref 3.8–10.6)

## 2017-11-10 LAB — COMPREHENSIVE METABOLIC PANEL
ALT: 43 U/L (ref 17–63)
AST: 36 U/L (ref 15–41)
Albumin: 3.2 g/dL — ABNORMAL LOW (ref 3.5–5.0)
Alkaline Phosphatase: 126 U/L (ref 38–126)
Anion gap: 12 (ref 5–15)
BUN: 14 mg/dL (ref 6–20)
CO2: 22 mmol/L (ref 22–32)
Calcium: 9.3 mg/dL (ref 8.9–10.3)
Chloride: 97 mmol/L — ABNORMAL LOW (ref 101–111)
Creatinine, Ser: 0.77 mg/dL (ref 0.61–1.24)
GFR calc Af Amer: >60 mL/min (ref >60)
GFR calc non Af Amer: >60 mL/min (ref >60)
Glucose, Bld: 113 mg/dL — ABNORMAL HIGH (ref 65–99)
Potassium: 3.5 mmol/L (ref 3.5–5.1)
Sodium: 131 mmol/L — ABNORMAL LOW (ref 135–145)
Total Bilirubin: 0.6 mg/dL (ref 0.3–1.2)
Total Protein: 7.5 g/dL (ref 6.5–8.1)

## 2017-11-10 LAB — MAGNESIUM: Magnesium: 1.7 mg/dL (ref 1.7–2.4)

## 2017-11-10 MED ORDER — SODIUM CHLORIDE 0.9 % IV SOLN
Freq: Once | INTRAVENOUS | Status: AC
Start: 2017-11-10 — End: 2017-11-10
  Administered 2017-11-10: 16:00:00 via INTRAVENOUS
  Filled 2017-11-10: qty 1000

## 2017-11-10 MED ORDER — SODIUM CHLORIDE 0.9 % IV SOLN
Freq: Once | INTRAVENOUS | Status: AC
  Administered 2017-11-10: 15:00:00 via INTRAVENOUS
  Filled 2017-11-10: qty 1000

## 2017-11-10 NOTE — Progress Notes (Signed)
Provider notified of pts ortho VS, ok to d/c at this time and return tomorrow for possible chemo and IVF.

## 2017-11-10 NOTE — Progress Notes (Signed)
Patient sent to infusion for IV fluids and orthostatic VS.  After 1L NS sitting 147/88 HR 90 supine 136/80 HR 86, standing 113/70 HR 101.  MD VO for additional 500 ML NS and recheck orthostatic VS.

## 2017-11-10 NOTE — Progress Notes (Signed)
See follow up.

## 2017-11-10 NOTE — Progress Notes (Addendum)
[facility] day:  11/10/2017  Chief Complaint: Patrick Hooper is a 72 y.o. adult with metastatic bladder cancer who is seen for assessment prior to day 8 of cycle #1 of carboplatin and gemcitabine.  HPI:  The patient was last seen in the medical oncology clinic on 11/03/2017.  At that time, he was feeling better overall. His blood pressure was stable. He had chronic shortness of breath and dizziness. He continued to smoke on a daily basis. Pain was well controlled.  Exam was stable.  He received day 1 of cycle #1 carboplatin and gemcitabine.  Symptomatically, he notes that standing up causes him to be dizzy.  He is not taking his blood pressure medications.  He denies any nausea, vomiting or diarrhea.  He is drinking "1 quart a day".  He denies any fever.   Past Medical History:  Diagnosis Date  . Bladder cancer ([place])   . Hypertension   . Lung cancer ([place]) 2016  . PVD (peripheral vascular disease) ([place])     Past Surgical History:  Procedure Laterality Date  . BLADDER SURGERY  08/12/2017  . BYPASS GRAFT FEMORAL/POPLITEAL W/VEIN  2009  . COLONOSCOPY  2013  . CYSTOSCOPY  08/12/2017  . IR ABDOMINAL AORTOBIFEMORAL CATHETER SERIALOGRAM  2009  . LOBECTOMY Right 09/2015  . PACEMAKER INSERTION  2003    Family History  Problem Relation Age of Onset  . Cancer Mother   . Cancer Sister   . Cancer Maternal Grandmother     Social History:  reports that he has been smoking cigarettes.  He has a 55.00 pack-year smoking history. he has never used smokeless tobacco. He reports that he drinks alcohol. He reports that he does not use drugs.  He smokes 1 pack a day for > 55 years.  He used to drink 4 alcoholic beverages/day.  Now his alcohol intake is infrequent. 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 There is the potential of past exposure to radiation and/or toxins. He is registered with the [place]  regarding his exposures.  He lives in [place].  The patient is accompanied by his girlfriend Optometrist) today.  Allergies:  Allergies  Allergen Reactions  . Cefazolin Rash and Shortness Of Breath  . Carvedilol Other (See Comments)    fatigue  . Chlorthalidone Other (See Comments)    Other reaction(s): Unknown  . Hydralazine     Other reaction(s): Unknown  . Hydrochlorothiazide Other (See Comments)    Current Medications: Current Outpatient Medications  Medication Sig Dispense Refill  . acetaminophen (TYLENOL) 500 MG tablet Take 500 mg by mouth every 6 (six) hours as needed for mild pain.     Marland Kitchen aspirin EC 81 MG tablet Take 81 mg by mouth at bedtime.    . bisacodyl (DULCOLAX) 10 MG suppository Place 1 suppository (10 mg total) rectally daily as needed for moderate constipation. 12 suppository 0  . cholecalciferol (VITAMIN D) 400 units TABS tablet Take 800 Units by mouth daily.    . clopidogrel (PLAVIX) 75 MG tablet Take 75 mg by mouth at bedtime.     Marland Kitchen dexamethasone (DECADRON) 4 MG tablet Take 2 tablets (8 mg total) by mouth daily. Start the day after carboplatin chemotherapy for 2 days. 30 tablet 1  . docusate sodium (COLACE) 100 MG capsule Take 100 mg 2 (two) times daily by mouth.    . lactulose (CHRONULAC) 10 GM/15ML solution Take 45  mLs (30 g total) by mouth 2 (two) times daily as needed for mild constipation or severe constipation. 1892 mL 0  . LORazepam (ATIVAN) 0.5 MG tablet Take 1 tablet (0.5 mg total) by mouth every 6 (six) hours as needed (Nausea or vomiting). (Patient taking differently: Take 0.5 mg by mouth every 6 (six) hours as needed (Nausea or vomiting). nausea) 30 tablet 0  . megestrol (MEGACE ORAL) 40 MG/ML suspension Take 5 mLs (200 mg total) by mouth daily. 240 mL 0  . mirtazapine (REMERON) 15 MG tablet Take 15 mg at bedtime by mouth.    . multivitamin-lutein (OCUVITE-LUTEIN) CAPS capsule Take 1 capsule by mouth daily. (Patient not taking: Reported on 11/11/2017) 30  capsule 0  . ondansetron (ZOFRAN) 4 MG tablet Take 1 tablet (4 mg total) by mouth every 6 (six) hours as needed for nausea. (Patient not taking: Reported on 11/11/2017) 20 tablet 0  . oxycodone (OXY-IR) 5 MG capsule Take 1 capsule (5 mg total) by mouth every 4 (four) hours as needed for pain. 30 capsule 0  . polyethylene glycol (MIRALAX / GLYCOLAX) packet Take 17 g by mouth daily. (Patient taking differently: Take 17 g by mouth daily as needed. ) 30 each 0  . protein supplement shake (PREMIER PROTEIN) LIQD Take 325 mLs (11 oz total) by mouth 2 (two) times daily between meals. (Patient not taking: Reported on 11/11/2017) 60 Can 0  . morphine (MS CONTIN) 15 MG 12 hr tablet Take 1 tablet (15 mg total) by mouth every 12 (twelve) hours. 30 tablet 0   No current facility-administered medications for this visit.    Facility-Administered Medications Ordered in Other Visits  Medication Dose Route Frequency Provider Last Rate Last Dose  . 0.9 %  sodium chloride infusion   Intravenous Continuous Verlon Au, NP 500 mL/hr at 10/31/17 1400      Review of Systems:  GENERAL:  Feels "bad, really dizzy".  No fever or sweats.  Weight up 2 pounds since last visit.  PERFORMANCE STATUS (ECOG):  2 HEENT:  No visual changes, sore throat, mouth sores or tenderness. Lungs: Shortness of breath with exertion.  Cough.  No hemoptysis. Cardiac:  Implanted pacemaker (NOT MRI COMPATIBLE). No chest pain, palpitations, orthopnea, or PND.  Blood pressure fluctuates.  Off amlodipine. GI:  Constipation secondary to narcotics.  No nausea, vomiting, diarrhea, melena or hematochezia. GU:  Dribbling.  Urgency, frequency, dysuria and nocturia.  No hematuria. Musculoskeletal:  Chronic bone pain (lower ribs, shoulders, back, neck, shoulder), well controlled.  Joint pain (left wrist and right elbow). No muscle tenderness. Extremities:  No pain or swelling. Skin:  No rashes or skin changes. Neuro:  Minor headache.  4th digit of left  hand change in sensation.  No numbness or weakness, balance or coordination issues. Endocrine:  No diabetes, thyroid issues, hot flashes or night sweats. Psych:  No mood changes, depression or anxiety. Pain:  No pain. Review of systems:  All other systems reviewed and found to be negative.  Physical Exam: Blood pressure 118/82, pulse 66, temperature (!) 97.3 F (36.3 C), temperature source Tympanic, resp. rate (!) 24, height 5' 11"  (1.803 m), weight 125 lb 8 oz (56.9 kg), SpO2 95 %. GENERAL:  Well developed, well nourished, gentleman sitting comfortably in the exam room in no acute distress. MENTAL STATUS:  Alert and oriented to person, place and time. HEAD:  Thin gray hair.  Normocephalic, atraumatic, face symmetric, no Cushingoid features. EYES:  Blue eyes.  Pupils equal round  and reactive to light and accomodation.  No conjunctivitis or scleral icterus. ENT:  Oropharynx clear without lesion.  Tongue normal.  Upper dentures.  No lower teeth.  Mucous membranes moist.  CHEST:  Left sided pacemaker. RESPIRATORY:  Clear to auscultation without rales, wheezes or rhonchi. CARDIOVASCULAR:  Regular rate and rhythm without murmur, rub or gallop. ABDOMEN:  Soft, non-tender, with active bowel sounds, and no hepatosplenomegaly.  No masses. SKIN:  No rashes, ulcers or lesions. EXTREMITIES: Nicotine stained nails.  No edema, no skin discoloration or tenderness.  No palpable cords. LYMPH NODES: No palpable cervical, supraclavicular, axillary or inguinal adenopathy  NEUROLOGICAL: Unremarkable. PSYCH:  Appropriate.   Appointment on 11/10/2017  Component Date Value Ref Range Status  . Magnesium 11/10/2017 1.7  1.7 - 2.4 mg/dL Final   Performed at [facility], [address], [place], [place] [address]  . Sodium 11/10/2017 131* 135 - 145 mmol/L Final  . Potassium 11/10/2017 3.5  3.5 - 5.1 mmol/L Final  . Chloride 11/10/2017 97* 101 - 111 mmol/L Final  . CO2 11/10/2017 22  22 - 32 mmol/L Final   . Glucose, Bld 11/10/2017 113* 65 - 99 mg/dL Final  . BUN 11/10/2017 14  6 - 20 mg/dL Final  . Creatinine, Ser 11/10/2017 0.77  0.61 - 1.24 mg/dL Final  . Calcium 11/10/2017 9.3  8.9 - 10.3 mg/dL Final  . Total Protein 11/10/2017 7.5  6.5 - 8.1 g/dL Final  . Albumin 11/10/2017 3.2* 3.5 - 5.0 g/dL Final  . AST 11/10/2017 36  15 - 41 U/L Final  . ALT 11/10/2017 43  17 - 63 U/L Final  . Alkaline Phosphatase 11/10/2017 126  38 - 126 U/L Final  . Total Bilirubin 11/10/2017 0.6  0.3 - 1.2 mg/dL Final  . GFR calc non Af Amer 11/10/2017 >60  >60 mL/min Final  . GFR calc Af Amer 11/10/2017 >60  >60 mL/min Final   Comment: (NOTE) The eGFR has been calculated using the CKD EPI equation. This calculation has not been validated in all clinical situations. eGFR's persistently <60 mL/min signify possible Chronic Kidney Disease.   Georgiann Hahn gap 11/10/2017 12  5 - 15 Final   Performed at [facility], [address] [place], [place] [address]  . WBC 11/10/2017 3.3* 3.8 - 10.6 K/uL Final  . RBC 11/10/2017 4.91  4.40 - 5.90 MIL/uL Final  . Hemoglobin 11/10/2017 13.9  13.0 - 18.0 g/dL Final  . HCT 11/10/2017 41.3  40.0 - 52.0 % Final  . MCV 11/10/2017 84.2  80.0 - 100.0 fL Final  . MCH 11/10/2017 28.3  26.0 - 34.0 pg Final  . MCHC 11/10/2017 33.6  32.0 - 36.0 g/dL Final  . RDW 11/10/2017 15.5* 11.5 - 14.5 % Final  . Platelets 11/10/2017 166  150 - 440 K/uL Final  . Neutrophils Relative % 11/10/2017 62  % Final  . Neutro Abs 11/10/2017 2.0  1.4 - 6.5 K/uL Final  . Lymphocytes Relative 11/10/2017 32  % Final  . Lymphs Abs 11/10/2017 1.0  1.0 - 3.6 K/uL Final  . Monocytes Relative 11/10/2017 3  % Final  . Monocytes Absolute 11/10/2017 0.1* 0.2 - 1.0 K/uL Final  . Eosinophils Relative 11/10/2017 1  % Final  . Eosinophils Absolute 11/10/2017 0.0  0 - 0.7 K/uL Final  . Basophils Relative 11/10/2017 2  % Final  . Basophils Absolute 11/10/2017 0.1  0 - 0.1 K/uL Final   Performed at [facility], [address] [place], [place] [address]    Assessment:  Patrick Hooper is a 72 y.o. adult with metastatic disease.  He has a recent history of muscle invasive bladder cancer and a distant history of lung cancer.  He has a history of stage IB lung cancer status post right upper lobe lobectomy in 09/2015.  Pathology revealed a [id number] adenocarcinoma.  Chest CT on 05/25/2017 revealed postoperative changes from a right upper lobe lobectomy without evidence of recurrence. There was new diffuse punctate sclerotic osseous lesions throughout the chest concerning for metastatic disease.  [facility] on 06/08/2017 at [facility] revealed right upper lobe lobectomy without evidence of recurrence. There was a new 3.2 cm mass along the right inferior bladder wall, anterior to the ureteral vesicular junction.  There were multiple new focal sclerotic and some probable mixed lytic and sclerotic osseous lesions in the axial skeleton many of which demonstrated increased FDG avidity. Differential diagnoses included include metastatic lung cancer, bladder cancer or prostate cancer. There was increased FDG uptake in the left medial thigh musculature of uncertain etiology.  PET scan on 09/09/2017 revealed several hypermetabolic mixed lytic and sclerotic osseous metastases throughout the axial skeleton.  There was suspected intramuscular soft tissue metastases in the right quadratus lumborum and posterior left lumbar paraspinal musculature.  There was mildly hypermetabolic retroperitoneal lymphadenopathy, probably representing nodal metastases.  There was diffuse irregular bladder wall thickening, asymmetrically prominent in the right bladder wall, suspicious for residual/recurrent bladder neoplasm.   There was nonspecific small ground-glass nodular opacity in the left upper lung lobe with low level metabolism.  There was no evidence of local tumor recurrence in the right lung status post right upper lobectomy    He underwent transurethral resection of bladder tumor (TURBT) with bilateral retrograde pyelogram on 08/12/2017.  He had a 3 cm bladder tumor on the right lateral wall with some sessile and papillary features.  Retrograde pyelograms were normal without filling defect.  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  He began monthly Xgeva on 09/16/2017 (last 10/27/2017).  He is day 8 of cycle #1 carboplatin and gemcitabine (11/03/2017).  He has had issues with hypotension despite a history of hypertension.  He is off blood pressure medications.  Cortisol level was normal on 10/27/2017.  He has a history of peripheral vascular disease s/p fem-fem bypass in 2016. He is on Plavix.  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  Colonoscopy was normal in 2011.  Symptomatically, patient is dizzy with standing.  He is orthostatic.  Fluid intake is marginal.  He denies any fever.  Pain is well controlled.  Exam is stable.  Plan: 1.  Labs today:  CBC with diff, CMP, Mg. 2.  No chemotherapy today 3.  1 L NS over 2 hrs; repeat orthostatics and possible 2nd liter of fluid 4.  Cardiology consult- recurrent hypotension  5.  RTC tomorrow for MD assessment, labs (CBC with diff, BMP) +/- IVF +/- gemcitabine   Lequita Asal, MD  11/10/2017, 4:35 PM

## 2017-11-11 ENCOUNTER — Ambulatory Visit: Payer: Non-veteran care | Admitting: Radiation Oncology

## 2017-11-11 ENCOUNTER — Inpatient Hospital Stay (HOSPITAL_BASED_OUTPATIENT_CLINIC_OR_DEPARTMENT_OTHER): Payer: Medicare Other | Admitting: Hematology and Oncology

## 2017-11-11 ENCOUNTER — Inpatient Hospital Stay: Payer: Medicare Other

## 2017-11-11 VITALS — BP 156/87 | HR 96 | Temp 96.2°F | Resp 18 | Wt 127.3 lb

## 2017-11-11 DIAGNOSIS — Z79899 Other long term (current) drug therapy: Secondary | ICD-10-CM

## 2017-11-11 DIAGNOSIS — I10 Essential (primary) hypertension: Secondary | ICD-10-CM

## 2017-11-11 DIAGNOSIS — C7951 Secondary malignant neoplasm of bone: Secondary | ICD-10-CM

## 2017-11-11 DIAGNOSIS — R918 Other nonspecific abnormal finding of lung field: Secondary | ICD-10-CM

## 2017-11-11 DIAGNOSIS — Z7982 Long term (current) use of aspirin: Secondary | ICD-10-CM | POA: Diagnosis not present

## 2017-11-11 DIAGNOSIS — C672 Malignant neoplasm of lateral wall of bladder: Secondary | ICD-10-CM

## 2017-11-11 DIAGNOSIS — Z809 Family history of malignant neoplasm, unspecified: Secondary | ICD-10-CM

## 2017-11-11 DIAGNOSIS — R0602 Shortness of breath: Secondary | ICD-10-CM

## 2017-11-11 DIAGNOSIS — G893 Neoplasm related pain (acute) (chronic): Secondary | ICD-10-CM | POA: Diagnosis not present

## 2017-11-11 DIAGNOSIS — R42 Dizziness and giddiness: Secondary | ICD-10-CM | POA: Diagnosis not present

## 2017-11-11 DIAGNOSIS — I739 Peripheral vascular disease, unspecified: Secondary | ICD-10-CM | POA: Diagnosis not present

## 2017-11-11 DIAGNOSIS — Z5111 Encounter for antineoplastic chemotherapy: Secondary | ICD-10-CM

## 2017-11-11 DIAGNOSIS — I471 Supraventricular tachycardia: Secondary | ICD-10-CM

## 2017-11-11 DIAGNOSIS — R001 Bradycardia, unspecified: Secondary | ICD-10-CM | POA: Diagnosis not present

## 2017-11-11 DIAGNOSIS — Z7189 Other specified counseling: Secondary | ICD-10-CM

## 2017-11-11 DIAGNOSIS — F17210 Nicotine dependence, cigarettes, uncomplicated: Secondary | ICD-10-CM | POA: Diagnosis not present

## 2017-11-11 DIAGNOSIS — Z800 Family history of malignant neoplasm of digestive organs: Secondary | ICD-10-CM

## 2017-11-11 LAB — BASIC METABOLIC PANEL
Anion gap: 11 (ref 5–15)
BUN: 11 mg/dL (ref 6–20)
CO2: 21 mmol/L — ABNORMAL LOW (ref 22–32)
Calcium: 9.2 mg/dL (ref 8.9–10.3)
Chloride: 99 mmol/L — ABNORMAL LOW (ref 101–111)
Creatinine, Ser: 0.57 mg/dL — ABNORMAL LOW (ref 0.61–1.24)
GFR calc Af Amer: >60 mL/min (ref >60)
GFR calc non Af Amer: >60 mL/min (ref >60)
Glucose, Bld: 89 mg/dL (ref 65–99)
Potassium: 3.6 mmol/L (ref 3.5–5.1)
Sodium: 131 mmol/L — ABNORMAL LOW (ref 135–145)

## 2017-11-11 LAB — CBC WITH DIFFERENTIAL/PLATELET
Basophils Absolute: 0 10*3/uL (ref 0–0.1)
Basophils Relative: 1 %
Eosinophils Absolute: 0 10*3/uL (ref 0–0.7)
Eosinophils Relative: 1 %
HCT: 37.5 % — ABNORMAL LOW (ref 40.0–52.0)
Hemoglobin: 12.7 g/dL — ABNORMAL LOW (ref 13.0–18.0)
Lymphocytes Relative: 26 %
Lymphs Abs: 1 10*3/uL (ref 1.0–3.6)
MCH: 28.5 pg (ref 26.0–34.0)
MCHC: 33.9 g/dL (ref 32.0–36.0)
MCV: 84.2 fL (ref 80.0–100.0)
Monocytes Absolute: 0.2 10*3/uL (ref 0.2–1.0)
Monocytes Relative: 4 %
Neutro Abs: 2.5 10*3/uL (ref 1.4–6.5)
Neutrophils Relative %: 68 %
Platelets: 115 10*3/uL — ABNORMAL LOW (ref 150–440)
RBC: 4.46 MIL/uL (ref 4.40–5.90)
RDW: 15.3 % — ABNORMAL HIGH (ref 11.5–14.5)
WBC: 3.7 10*3/uL — ABNORMAL LOW (ref 3.8–10.6)

## 2017-11-11 MED ORDER — ONDANSETRON HCL 4 MG PO TABS
8.0000 mg | ORAL_TABLET | Freq: Once | ORAL | Status: AC
  Administered 2017-11-11: 8 mg via ORAL
  Filled 2017-11-11: qty 2

## 2017-11-11 MED ORDER — SODIUM CHLORIDE 0.9 % IV SOLN
Freq: Once | INTRAVENOUS | Status: AC
  Administered 2017-11-11: 11:00:00 via INTRAVENOUS
  Filled 2017-11-11: qty 1000

## 2017-11-11 MED ORDER — MORPHINE SULFATE ER 15 MG PO TBCR: 15.0000 mg | EXTENDED_RELEASE_TABLET | Freq: Two times a day (BID) | ORAL | 0 refills | Status: DC

## 2017-11-11 MED ORDER — SODIUM CHLORIDE 0.9 % IV SOLN
1400.0000 mg | Freq: Once | INTRAVENOUS | Status: AC
  Administered 2017-11-11: 1400 mg via INTRAVENOUS
  Filled 2017-11-11: qty 10.52

## 2017-11-11 NOTE — Progress Notes (Signed)
Reduce dose of gemzar today per md

## 2017-11-11 NOTE — Progress Notes (Signed)
Patient is feeling much better today. BP 156/87 HR 96.  Asking for refill on MS Contin.

## 2017-11-13 ENCOUNTER — Encounter: Payer: Self-pay | Admitting: Hematology and Oncology

## 2017-11-13 NOTE — Progress Notes (Signed)
[facility] day:  11/11/2017  Chief Complaint: Patrick Hooper is a 72 y.o. adult with metastatic bladder cancer who is seen for assessment prior to day 8 of cycle #1 of carboplatin and gemcitabine.  HPI:  The patient was last seen in the medical oncology clinic on 11/10/2017.  At that time, he was extremely dizzy.  Oral intake was poor.  Chemotherapy was postponed.  He received 1.5 liters of fluid.  During the interim, he has done well.  He feels great today.  He denies any complaints.  He is not light headed or dizzy.  He denies any nausea, vomiting or diarrhea.  He is drinking fluids better.  He denies pain.  He requests a refill of his MSContin as he is running out.  He denies any fevers.   Past Medical History:  Diagnosis Date  . Bladder cancer ([place])   . Hypertension   . Lung cancer ([place]) 2016  . PVD (peripheral vascular disease) ([place])     Past Surgical History:  Procedure Laterality Date  . BLADDER SURGERY  08/12/2017  . BYPASS GRAFT FEMORAL/POPLITEAL W/VEIN  2009  . COLONOSCOPY  2013  . CYSTOSCOPY  08/12/2017  . IR ABDOMINAL AORTOBIFEMORAL CATHETER SERIALOGRAM  2009  . LOBECTOMY Right 09/2015  . PACEMAKER INSERTION  2003    Family History  Problem Relation Age of Onset  . Cancer Mother   . Cancer Sister   . Cancer Maternal Grandmother     Social History:  reports that he has been smoking cigarettes.  He has a 55.00 pack-year smoking history. he has never used smokeless tobacco. He reports that he drinks alcohol. He reports that he does not use drugs.  He smokes 1 pack a day for > 55 years.  He used to drink 4 alcoholic beverages/day.  Now his alcohol intake is infrequent. 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 There is the potential of past exposure to radiation and/or toxins. He is registered with the [place] regarding his exposures.  He lives in [place].  The patient is  accompanied by his girlfriend Optometrist) today.  Allergies:  Allergies  Allergen Reactions  . Cefazolin Rash and Shortness Of Breath  . Carvedilol Other (See Comments)    fatigue  . Chlorthalidone Other (See Comments)    Other reaction(s): Unknown  . Hydralazine     Other reaction(s): Unknown  . Hydrochlorothiazide Other (See Comments)    Current Medications: Current Outpatient Medications  Medication Sig Dispense Refill  . acetaminophen (TYLENOL) 500 MG tablet Take 500 mg by mouth every 6 (six) hours as needed for mild pain.     Marland Kitchen aspirin EC 81 MG tablet Take 81 mg by mouth at bedtime.    . bisacodyl (DULCOLAX) 10 MG suppository Place 1 suppository (10 mg total) rectally daily as needed for moderate constipation. 12 suppository 0  . cholecalciferol (VITAMIN D) 400 units TABS tablet Take 800 Units by mouth daily.    . clopidogrel (PLAVIX) 75 MG tablet Take 75 mg by mouth at bedtime.     Marland Kitchen dexamethasone (DECADRON) 4 MG tablet Take 2 tablets (8 mg total) by mouth daily. Start the day after carboplatin chemotherapy for 2 days. 30 tablet 1  . docusate sodium (COLACE) 100 MG capsule Take 100 mg 2 (two) times daily by mouth.    . lactulose (CHRONULAC) 10 GM/15ML solution Take 45 mLs (30 g total) by  mouth 2 (two) times daily as needed for mild constipation or severe constipation. 1892 mL 0  . LORazepam (ATIVAN) 0.5 MG tablet Take 1 tablet (0.5 mg total) by mouth every 6 (six) hours as needed (Nausea or vomiting). (Patient taking differently: Take 0.5 mg by mouth every 6 (six) hours as needed (Nausea or vomiting). nausea) 30 tablet 0  . megestrol (MEGACE ORAL) 40 MG/ML suspension Take 5 mLs (200 mg total) by mouth daily. 240 mL 0  . mirtazapine (REMERON) 15 MG tablet Take 15 mg at bedtime by mouth.    . morphine (MS CONTIN) 15 MG 12 hr tablet Take 1 tablet (15 mg total) by mouth every 12 (twelve) hours. 30 tablet 0  . oxycodone (OXY-IR) 5 MG capsule Take 1 capsule (5 mg total) by mouth every 4  (four) hours as needed for pain. 30 capsule 0  . polyethylene glycol (MIRALAX / GLYCOLAX) packet Take 17 g by mouth daily. (Patient taking differently: Take 17 g by mouth daily as needed. ) 30 each 0  . multivitamin-lutein (OCUVITE-LUTEIN) CAPS capsule Take 1 capsule by mouth daily. (Patient not taking: Reported on 11/11/2017) 30 capsule 0  . ondansetron (ZOFRAN) 4 MG tablet Take 1 tablet (4 mg total) by mouth every 6 (six) hours as needed for nausea. (Patient not taking: Reported on 11/11/2017) 20 tablet 0  . protein supplement shake (PREMIER PROTEIN) LIQD Take 325 mLs (11 oz total) by mouth 2 (two) times daily between meals. (Patient not taking: Reported on 11/11/2017) 60 Can 0   No current facility-administered medications for this visit.    Facility-Administered Medications Ordered in Other Visits  Medication Dose Route Frequency Provider Last Rate Last Dose  . 0.9 %  sodium chloride infusion   Intravenous Continuous Verlon Au, NP 500 mL/hr at 10/31/17 1400      Review of Systems:  GENERAL:  Feels "better".  No fever or sweats.  Weight up 2 pounds since last visit.  PERFORMANCE STATUS (ECOG):  2 HEENT:  No visual changes, sore throat, mouth sores or tenderness. Lungs: Shortness of breath with exertion.  Cough.  No hemoptysis. Cardiac:  Implanted pacemaker (NOT MRI COMPATIBLE). No chest pain, palpitations, orthopnea, or PND.  Blood pressure fluctuates.  Off amlodipine. GI:  No nausea, vomiting, diarrhea, contipation, melena or hematochezia. GU:  Dribbling.  Urgency, frequency, dysuria and nocturia.  No hematuria. Musculoskeletal:  Chronic bone pain (lower ribs, shoulders, back, neck, shoulder), well controlled.  Joint pain (left wrist and right elbow). No muscle tenderness. Extremities:  No pain or swelling. Skin:  No rashes or skin changes. Neuro: No headache.  4th digit of left hand change in sensation.  No numbness or weakness, balance or coordination issues. Endocrine:  No  diabetes, thyroid issues, hot flashes or night sweats. Psych:  No mood changes, depression or anxiety. Pain:  No pain. Review of systems:  All other systems reviewed and found to be negative.  Physical Exam: Blood pressure (!) 156/87, pulse 96, temperature (!) 96.2 F (35.7 C), temperature source Tympanic, resp. rate 18, weight 127 lb 5 oz (57.7 kg). GENERAL:  Well developed, well nourished, gentleman sitting comfortably in the exam room in no acute distress. MENTAL STATUS:  Alert and oriented to person, place and time. HEAD:  Thin gray hair.  Normocephalic, atraumatic, face symmetric, no Cushingoid features. EYES:  Blue eyes.  Pupils equal round and reactive to light and accomodation.  No conjunctivitis or scleral icterus. ENT:  Oropharynx clear without lesion.  Tongue  normal.  Upper dentures.  No lower teeth.  Mucous membranes moist.  CHEST:  Left sided pacemaker. RESPIRATORY:  Clear to auscultation without rales, wheezes or rhonchi. CARDIOVASCULAR:  Regular rate and rhythm without murmur, rub or gallop. ABDOMEN:  Soft, non-tender, with active bowel sounds, and no hepatosplenomegaly.  No masses. SKIN:  No rashes, ulcers or lesions. EXTREMITIES: Nicotine stained nails.  No edema, no skin discoloration or tenderness.  No palpable cords. LYMPH NODES: No palpable cervical, supraclavicular, axillary or inguinal adenopathy  NEUROLOGICAL: Unremarkable. PSYCH:  Appropriate.   Appointment on 11/11/2017  Component Date Value Ref Range Status  . WBC 11/11/2017 3.7* 3.8 - 10.6 K/uL Final  . RBC 11/11/2017 4.46  4.40 - 5.90 MIL/uL Final  . Hemoglobin 11/11/2017 12.7* 13.0 - 18.0 g/dL Final  . HCT 11/11/2017 37.5* 40.0 - 52.0 % Final  . MCV 11/11/2017 84.2  80.0 - 100.0 fL Final  . MCH 11/11/2017 28.5  26.0 - 34.0 pg Final  . MCHC 11/11/2017 33.9  32.0 - 36.0 g/dL Final  . RDW 11/11/2017 15.3* 11.5 - 14.5 % Final  . Platelets 11/11/2017 115* 150 - 440 K/uL Final  . Neutrophils Relative %  11/11/2017 68  % Final  . Neutro Abs 11/11/2017 2.5  1.4 - 6.5 K/uL Final  . Lymphocytes Relative 11/11/2017 26  % Final  . Lymphs Abs 11/11/2017 1.0  1.0 - 3.6 K/uL Final  . Monocytes Relative 11/11/2017 4  % Final  . Monocytes Absolute 11/11/2017 0.2  0.2 - 1.0 K/uL Final  . Eosinophils Relative 11/11/2017 1  % Final  . Eosinophils Absolute 11/11/2017 0.0  0 - 0.7 K/uL Final  . Basophils Relative 11/11/2017 1  % Final  . Basophils Absolute 11/11/2017 0.0  0 - 0.1 K/uL Final   Performed at [facility], [address] [place], [place] [address]  . Sodium 11/11/2017 131* 135 - 145 mmol/L Final  . Potassium 11/11/2017 3.6  3.5 - 5.1 mmol/L Final  . Chloride 11/11/2017 99* 101 - 111 mmol/L Final  . CO2 11/11/2017 21* 22 - 32 mmol/L Final  . Glucose, Bld 11/11/2017 89  65 - 99 mg/dL Final  . BUN 11/11/2017 11  6 - 20 mg/dL Final  . Creatinine, Ser 11/11/2017 0.57* 0.61 - 1.24 mg/dL Final  . Calcium 11/11/2017 9.2  8.9 - 10.3 mg/dL Final  . GFR calc non Af Amer 11/11/2017 >60  >60 mL/min Final  . GFR calc Af Amer 11/11/2017 >60  >60 mL/min Final   Comment: (NOTE) The eGFR has been calculated using the CKD EPI equation. This calculation has not been validated in all clinical situations. eGFR's persistently <60 mL/min signify possible Chronic Kidney Disease.   Georgiann Hahn gap 11/11/2017 11  5 - 15 Final   Performed at [facility], [address] [place], [place] [address]  Appointment on 11/10/2017  Component Date Value Ref Range Status  . Magnesium 11/10/2017 1.7  1.7 - 2.4 mg/dL Final   Performed at [facility], [address], [place], [place] [address]  . Sodium 11/10/2017 131* 135 - 145 mmol/L Final  . Potassium 11/10/2017 3.5  3.5 - 5.1 mmol/L Final  . Chloride 11/10/2017 97* 101 - 111 mmol/L Final  . CO2 11/10/2017 22  22 - 32 mmol/L Final  . Glucose, Bld 11/10/2017 113* 65 - 99 mg/dL Final  . BUN 11/10/2017 14  6 - 20 mg/dL Final  . Creatinine, Ser  11/10/2017 0.77  0.61 - 1.24  mg/dL Final  . Calcium 11/10/2017 9.3  8.9 - 10.3 mg/dL Final  . Total Protein 11/10/2017 7.5  6.5 - 8.1 g/dL Final  . Albumin 11/10/2017 3.2* 3.5 - 5.0 g/dL Final  . AST 11/10/2017 36  15 - 41 U/L Final  . ALT 11/10/2017 43  17 - 63 U/L Final  . Alkaline Phosphatase 11/10/2017 126  38 - 126 U/L Final  . Total Bilirubin 11/10/2017 0.6  0.3 - 1.2 mg/dL Final  . GFR calc non Af Amer 11/10/2017 >60  >60 mL/min Final  . GFR calc Af Amer 11/10/2017 >60  >60 mL/min Final   Comment: (NOTE) The eGFR has been calculated using the CKD EPI equation. This calculation has not been validated in all clinical situations. eGFR's persistently <60 mL/min signify possible Chronic Kidney Disease.   Georgiann Hahn gap 11/10/2017 12  5 - 15 Final   Performed at [facility], [address] [place], [place] [address]  . WBC 11/10/2017 3.3* 3.8 - 10.6 K/uL Final  . RBC 11/10/2017 4.91  4.40 - 5.90 MIL/uL Final  . Hemoglobin 11/10/2017 13.9  13.0 - 18.0 g/dL Final  . HCT 11/10/2017 41.3  40.0 - 52.0 % Final  . MCV 11/10/2017 84.2  80.0 - 100.0 fL Final  . MCH 11/10/2017 28.3  26.0 - 34.0 pg Final  . MCHC 11/10/2017 33.6  32.0 - 36.0 g/dL Final  . RDW 11/10/2017 15.5* 11.5 - 14.5 % Final  . Platelets 11/10/2017 166  150 - 440 K/uL Final  . Neutrophils Relative % 11/10/2017 62  % Final  . Neutro Abs 11/10/2017 2.0  1.4 - 6.5 K/uL Final  . Lymphocytes Relative 11/10/2017 32  % Final  . Lymphs Abs 11/10/2017 1.0  1.0 - 3.6 K/uL Final  . Monocytes Relative 11/10/2017 3  % Final  . Monocytes Absolute 11/10/2017 0.1* 0.2 - 1.0 K/uL Final  . Eosinophils Relative 11/10/2017 1  % Final  . Eosinophils Absolute 11/10/2017 0.0  0 - 0.7 K/uL Final  . Basophils Relative 11/10/2017 2  % Final  . Basophils Absolute 11/10/2017 0.1  0 - 0.1 K/uL Final   Performed at [facility], [address], [place], [place] [address]    Assessment:  Verlan Grotz is a 72 y.o. adult with  metastatic disease.  He has a recent history of muscle invasive bladder cancer and a distant history of lung cancer.  He has a history of stage IB lung cancer status post right upper lobe lobectomy in 09/2015.  Pathology revealed a T2aN0M0 adenocarcinoma.  Chest CT on 05/25/2017 revealed postoperative changes from a right upper lobe lobectomy without evidence of recurrence. There was new diffuse punctate sclerotic osseous lesions throughout the chest concerning for metastatic disease.  PET scan on 06/08/2017 at the [facility] revealed right upper lobe lobectomy without evidence of recurrence. There was a new 3.2 cm mass along the right inferior bladder wall, anterior to the ureteral vesicular junction.  There were multiple new focal sclerotic and some probable mixed lytic and sclerotic osseous lesions in the axial skeleton many of which demonstrated increased FDG avidity. Differential diagnoses included include metastatic lung cancer, bladder cancer or prostate cancer. There was increased FDG uptake in the left medial thigh musculature of uncertain etiology.  PET scan on 09/09/2017 revealed several hypermetabolic mixed lytic and sclerotic osseous metastases throughout the axial skeleton.  There was suspected intramuscular soft tissue metastases in the right quadratus lumborum and posterior left lumbar paraspinal musculature.  There was mildly hypermetabolic retroperitoneal lymphadenopathy, probably representing nodal metastases.  There was diffuse irregular bladder wall thickening, asymmetrically prominent in the right bladder wall, suspicious for residual/recurrent bladder neoplasm.   There was nonspecific small ground-glass nodular opacity in the left upper lung lobe with low level metabolism.  There was no evidence of local tumor recurrence in the right lung status post right upper lobectomy   He underwent transurethral resection of bladder tumor (TURBT) with bilateral retrograde pyelogram on  08/12/2017.  He had a 3 cm bladder tumor on the right lateral wall with some sessile and papillary features.  Retrograde pyelograms were normal without filling defect.  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  He began monthly Xgeva on 09/16/2017 (last 10/27/2017).  He is day 8 of cycle #1 carboplatin and gemcitabine (11/03/2017).  He has had issues with hypotension despite a history of hypertension.  He is off blood pressure medications.  Cortisol level was normal on 10/27/2017.  He has a history of peripheral vascular disease s/p fem-fem bypass in 2016. He is on Plavix.  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  Colonoscopy was normal in 2011.  Symptomatically, he feels better.  He is no longer orthostatic.  Fluid intake has improved.  He denies any fever.  Pain is well controlled.  Exam is stable.  Plan: 1.  Labs today:  CBC with diff, BMP. 2.  Discuss drop in platelet count.  Discuss checking counts next week.  Patient to call if any increased bruising or bleeding. 3.  Day 8 gemcitabine today.  Dose 80%. 4.  Rx:  MSContin 15 mg po q 12 hours (dis: #30). 5.  RTC on 11/17/2017 for labs (CBC with diff, CMP, Mg), +/- IVFs. 6.  RTC on 11/24/2017 for MD assessment, labs (CBC with diff, CMP, Mg), +/- IVFs, and day 1 of cycle #2 carboplatin + gemcitabine.   Lequita Asal, MD  11/11/2017, 12:37 PM

## 2017-11-14 ENCOUNTER — Other Ambulatory Visit (INDEPENDENT_AMBULATORY_CARE_PROVIDER_SITE_OTHER): Payer: Self-pay | Admitting: Vascular Surgery

## 2017-11-14 ENCOUNTER — Encounter: Payer: Self-pay | Admitting: Hematology and Oncology

## 2017-11-17 ENCOUNTER — Inpatient Hospital Stay: Payer: Medicare Other

## 2017-11-17 VITALS — BP 134/82 | HR 89 | Temp 98.0°F | Resp 18

## 2017-11-17 DIAGNOSIS — C672 Malignant neoplasm of lateral wall of bladder: Secondary | ICD-10-CM

## 2017-11-17 DIAGNOSIS — Z5111 Encounter for antineoplastic chemotherapy: Secondary | ICD-10-CM | POA: Diagnosis not present

## 2017-11-17 DIAGNOSIS — C7951 Secondary malignant neoplasm of bone: Secondary | ICD-10-CM

## 2017-11-17 LAB — CBC WITH DIFFERENTIAL/PLATELET
Basophils Absolute: 0 10*3/uL (ref 0–0.1)
Basophils Relative: 1 %
Eosinophils Absolute: 0 10*3/uL (ref 0–0.7)
Eosinophils Relative: 0 %
HCT: 37.9 % — ABNORMAL LOW (ref 40.0–52.0)
Hemoglobin: 12.7 g/dL — ABNORMAL LOW (ref 13.0–18.0)
Lymphocytes Relative: 54 %
Lymphs Abs: 0.6 10*3/uL — ABNORMAL LOW (ref 1.0–3.6)
MCH: 28 pg (ref 26.0–34.0)
MCHC: 33.6 g/dL (ref 32.0–36.0)
MCV: 83.4 fL (ref 80.0–100.0)
Monocytes Absolute: 0 10*3/uL — ABNORMAL LOW (ref 0.2–1.0)
Monocytes Relative: 4 %
Neutro Abs: 0.4 10*3/uL — ABNORMAL LOW (ref 1.4–6.5)
Neutrophils Relative %: 41 %
Platelets: 49 10*3/uL — ABNORMAL LOW (ref 150–440)
RBC: 4.55 MIL/uL (ref 4.40–5.90)
RDW: 15.3 % — ABNORMAL HIGH (ref 11.5–14.5)
WBC: 1.1 10*3/uL — CL (ref 3.8–10.6)

## 2017-11-17 LAB — COMPREHENSIVE METABOLIC PANEL
ALT: 74 U/L — ABNORMAL HIGH (ref 17–63)
AST: 47 U/L — ABNORMAL HIGH (ref 15–41)
Albumin: 3.4 g/dL — ABNORMAL LOW (ref 3.5–5.0)
Alkaline Phosphatase: 121 U/L (ref 38–126)
Anion gap: 10 (ref 5–15)
BUN: 11 mg/dL (ref 6–20)
CO2: 22 mmol/L (ref 22–32)
Calcium: 8.4 mg/dL — ABNORMAL LOW (ref 8.9–10.3)
Chloride: 98 mmol/L — ABNORMAL LOW (ref 101–111)
Creatinine, Ser: 0.66 mg/dL (ref 0.61–1.24)
GFR calc Af Amer: >60 mL/min (ref >60)
GFR calc non Af Amer: >60 mL/min (ref >60)
Glucose, Bld: 110 mg/dL — ABNORMAL HIGH (ref 65–99)
Potassium: 3.4 mmol/L — ABNORMAL LOW (ref 3.5–5.1)
Sodium: 130 mmol/L — ABNORMAL LOW (ref 135–145)
Total Bilirubin: 0.5 mg/dL (ref 0.3–1.2)
Total Protein: 7.4 g/dL (ref 6.5–8.1)

## 2017-11-17 LAB — MAGNESIUM: Magnesium: 2 mg/dL (ref 1.7–2.4)

## 2017-11-17 MED ORDER — SODIUM CHLORIDE 0.9 % IV SOLN
Freq: Once | INTRAVENOUS | Status: AC
  Administered 2017-11-17: 10:00:00 via INTRAVENOUS
  Filled 2017-11-17: qty 1000

## 2017-11-18 ENCOUNTER — Inpatient Hospital Stay: Payer: Medicare Other

## 2017-11-18 ENCOUNTER — Other Ambulatory Visit: Payer: Self-pay | Admitting: Urgent Care

## 2017-11-18 ENCOUNTER — Other Ambulatory Visit: Payer: Self-pay | Admitting: Hematology and Oncology

## 2017-11-18 ENCOUNTER — Telehealth: Payer: Self-pay | Admitting: *Deleted

## 2017-11-18 VITALS — BP 129/82 | HR 76 | Resp 18

## 2017-11-18 DIAGNOSIS — C672 Malignant neoplasm of lateral wall of bladder: Secondary | ICD-10-CM

## 2017-11-18 DIAGNOSIS — Z5111 Encounter for antineoplastic chemotherapy: Secondary | ICD-10-CM | POA: Diagnosis not present

## 2017-11-18 DIAGNOSIS — C7951 Secondary malignant neoplasm of bone: Secondary | ICD-10-CM

## 2017-11-18 LAB — CBC WITH DIFFERENTIAL/PLATELET
Basophils Absolute: 0 10*3/uL (ref 0–0.1)
Basophils Relative: 1 %
Eosinophils Absolute: 0 10*3/uL (ref 0–0.7)
Eosinophils Relative: 1 %
HCT: 35.9 % — ABNORMAL LOW (ref 40.0–52.0)
Hemoglobin: 12.1 g/dL — ABNORMAL LOW (ref 13.0–18.0)
Lymphocytes Relative: 59 %
Lymphs Abs: 0.8 10*3/uL — ABNORMAL LOW (ref 1.0–3.6)
MCH: 28 pg (ref 26.0–34.0)
MCHC: 33.8 g/dL (ref 32.0–36.0)
MCV: 82.7 fL (ref 80.0–100.0)
Monocytes Absolute: 0.1 10*3/uL — ABNORMAL LOW (ref 0.2–1.0)
Monocytes Relative: 10 %
Neutro Abs: 0.4 10*3/uL — ABNORMAL LOW (ref 1.4–6.5)
Neutrophils Relative %: 29 %
Platelets: 40 10*3/uL — ABNORMAL LOW (ref 150–440)
RBC: 4.34 MIL/uL — ABNORMAL LOW (ref 4.40–5.90)
RDW: 15.2 % — ABNORMAL HIGH (ref 11.5–14.5)
WBC: 1.4 10*3/uL — CL (ref 3.8–10.6)

## 2017-11-18 LAB — COMPREHENSIVE METABOLIC PANEL
ALT: 60 U/L (ref 17–63)
AST: 37 U/L (ref 15–41)
Albumin: 3.2 g/dL — ABNORMAL LOW (ref 3.5–5.0)
Alkaline Phosphatase: 114 U/L (ref 38–126)
Anion gap: 9 (ref 5–15)
BUN: 6 mg/dL (ref 6–20)
CO2: 22 mmol/L (ref 22–32)
Calcium: 8.3 mg/dL — ABNORMAL LOW (ref 8.9–10.3)
Chloride: 100 mmol/L — ABNORMAL LOW (ref 101–111)
Creatinine, Ser: 0.65 mg/dL (ref 0.61–1.24)
GFR calc Af Amer: >60 mL/min (ref >60)
GFR calc non Af Amer: >60 mL/min (ref >60)
Glucose, Bld: 142 mg/dL — ABNORMAL HIGH (ref 65–99)
Potassium: 3 mmol/L — ABNORMAL LOW (ref 3.5–5.1)
Sodium: 131 mmol/L — ABNORMAL LOW (ref 135–145)
Total Bilirubin: 0.4 mg/dL (ref 0.3–1.2)
Total Protein: 6.8 g/dL (ref 6.5–8.1)

## 2017-11-18 LAB — MAGNESIUM: Magnesium: 2 mg/dL (ref 1.7–2.4)

## 2017-11-18 MED ORDER — SODIUM CHLORIDE 0.9 % IV SOLN
INTRAVENOUS | Status: DC
  Administered 2017-11-18: 14:00:00 via INTRAVENOUS
  Filled 2017-11-18: qty 1000

## 2017-11-18 MED ORDER — POTASSIUM CHLORIDE ER 10 MEQ PO TBCR: 10.0000 meq | EXTENDED_RELEASE_TABLET | Freq: Two times a day (BID) | ORAL | 0 refills | Status: DC

## 2017-11-18 MED ORDER — SODIUM CHLORIDE 0.9 % IV SOLN
Freq: Once | INTRAVENOUS | Status: AC
  Filled 2017-11-18: qty 1000

## 2017-11-18 MED ORDER — SODIUM CHLORIDE 0.9 % IV SOLN
Freq: Once | INTRAVENOUS | Status: AC
  Administered 2017-11-18: 14:00:00 via INTRAVENOUS
  Filled 2017-11-18: qty 1000

## 2017-11-18 NOTE — Telephone Encounter (Signed)
-----   Message from Lequita Asal, MD sent at 11/18/2017  5:04 PM EST ----- Regarding: Did patient get potassium today?  Is he on oral potassium?  Labs on Monday?  M  ----- Message ----- From: Interface, Lab In [place] Sent: 11/18/2017   1:39 PM To: Lequita Asal, MD

## 2017-11-18 NOTE — Telephone Encounter (Signed)
Called patient's sister, Manning Charity,  to ask her to get in touch with patient to let him know we have called a K+ prescription in to his pharmacy.  She states she will get in touch with him.

## 2017-11-21 ENCOUNTER — Ambulatory Visit
Admission: RE | Admit: 2017-11-21 | Discharge: 2017-11-21 | Disposition: A | Discharge status: Home / Self Care | Payer: Medicare Other | Source: Ambulatory Visit | Attending: Vascular Surgery | Admitting: Vascular Surgery

## 2017-11-21 ENCOUNTER — Encounter
Admission: RE | Disposition: A | Discharge status: Home / Self Care | Payer: Self-pay | Source: Ambulatory Visit | Attending: Vascular Surgery

## 2017-11-21 DIAGNOSIS — Z888 Allergy status to other drugs, medicaments and biological substances status: Secondary | ICD-10-CM | POA: Diagnosis not present

## 2017-11-21 DIAGNOSIS — C672 Malignant neoplasm of lateral wall of bladder: Secondary | ICD-10-CM | POA: Insufficient documentation

## 2017-11-21 DIAGNOSIS — I739 Peripheral vascular disease, unspecified: Secondary | ICD-10-CM | POA: Insufficient documentation

## 2017-11-21 DIAGNOSIS — I10 Essential (primary) hypertension: Secondary | ICD-10-CM | POA: Insufficient documentation

## 2017-11-21 DIAGNOSIS — Z881 Allergy status to other antibiotic agents status: Secondary | ICD-10-CM | POA: Insufficient documentation

## 2017-11-21 DIAGNOSIS — G893 Neoplasm related pain (acute) (chronic): Secondary | ICD-10-CM | POA: Diagnosis not present

## 2017-11-21 DIAGNOSIS — Z950 Presence of cardiac pacemaker: Secondary | ICD-10-CM | POA: Insufficient documentation

## 2017-11-21 DIAGNOSIS — C679 Malignant neoplasm of bladder, unspecified: Secondary | ICD-10-CM | POA: Diagnosis present

## 2017-11-21 DIAGNOSIS — C419 Malignant neoplasm of bone and articular cartilage, unspecified: Secondary | ICD-10-CM

## 2017-11-21 DIAGNOSIS — Z902 Acquired absence of lung [part of]: Secondary | ICD-10-CM | POA: Insufficient documentation

## 2017-11-21 DIAGNOSIS — C7951 Secondary malignant neoplasm of bone: Secondary | ICD-10-CM | POA: Diagnosis not present

## 2017-11-21 DIAGNOSIS — Z79899 Other long term (current) drug therapy: Secondary | ICD-10-CM | POA: Insufficient documentation

## 2017-11-21 DIAGNOSIS — Z951 Presence of aortocoronary bypass graft: Secondary | ICD-10-CM | POA: Diagnosis not present

## 2017-11-21 DIAGNOSIS — Z7982 Long term (current) use of aspirin: Secondary | ICD-10-CM | POA: Insufficient documentation

## 2017-11-21 DIAGNOSIS — F17200 Nicotine dependence, unspecified, uncomplicated: Secondary | ICD-10-CM | POA: Diagnosis not present

## 2017-11-21 DIAGNOSIS — Z809 Family history of malignant neoplasm, unspecified: Secondary | ICD-10-CM | POA: Insufficient documentation

## 2017-11-21 DIAGNOSIS — Z7902 Long term (current) use of antithrombotics/antiplatelets: Secondary | ICD-10-CM | POA: Insufficient documentation

## 2017-11-21 DIAGNOSIS — Z9889 Other specified postprocedural states: Secondary | ICD-10-CM | POA: Diagnosis not present

## 2017-11-21 HISTORY — PX: PORTA CATH INSERTION: CATH118285

## 2017-11-21 SURGERY — PORTA CATH INSERTION
Anesthesia: Moderate Sedation

## 2017-11-21 MED ORDER — SODIUM CHLORIDE 0.9 % IV SOLN
INTRAVENOUS | Status: DC
  Administered 2017-11-21: 07:00:00 via INTRAVENOUS

## 2017-11-21 MED ORDER — SODIUM CHLORIDE 0.9 % IR SOLN
Freq: Once | Status: DC
  Filled 2017-11-21 (×2): qty 2

## 2017-11-21 MED ORDER — LIDOCAINE-EPINEPHRINE (PF) 1 %-1:200000 IJ SOLN
INTRAMUSCULAR | Status: AC
  Filled 2017-11-21: qty 30

## 2017-11-21 MED ORDER — FENTANYL CITRATE (PF) 100 MCG/2ML IJ SOLN
INTRAMUSCULAR | Status: DC | PRN
  Administered 2017-11-21 (×2): 12.5 ug via INTRAVENOUS
  Administered 2017-11-21: 50 ug via INTRAVENOUS

## 2017-11-21 MED ORDER — FENTANYL CITRATE (PF) 100 MCG/2ML IJ SOLN
INTRAMUSCULAR | Status: AC
  Filled 2017-11-21: qty 2

## 2017-11-21 MED ORDER — MIDAZOLAM HCL 2 MG/2ML IJ SOLN
INTRAMUSCULAR | Status: DC | PRN
  Administered 2017-11-21: 0.5 mg via INTRAVENOUS
  Administered 2017-11-21: 1 mg via INTRAVENOUS
  Administered 2017-11-21: 2 mg via INTRAVENOUS

## 2017-11-21 MED ORDER — CLINDAMYCIN PHOSPHATE 300 MG/50ML IV SOLN
INTRAVENOUS | Status: AC
  Filled 2017-11-21: qty 50

## 2017-11-21 MED ORDER — CLINDAMYCIN PHOSPHATE 300 MG/50ML IV SOLN
300.0000 mg | Freq: Once | INTRAVENOUS | Status: AC
  Administered 2017-11-21: 300 mg via INTRAVENOUS

## 2017-11-21 MED ORDER — MIDAZOLAM HCL 5 MG/5ML IJ SOLN
INTRAMUSCULAR | Status: AC
  Filled 2017-11-21: qty 5

## 2017-11-21 SURGICAL SUPPLY — 8 items
CANNULA 5F STIFF (CANNULA) ×3 IMPLANT
KIT PORT POWER 8FR ISP CVUE (Miscellaneous) ×3 IMPLANT
PACK ANGIOGRAPHY (CUSTOM PROCEDURE TRAY) ×3 IMPLANT
PAD GROUND ADULT SPLIT (MISCELLANEOUS) ×3 IMPLANT
PENCIL ELECTRO HAND CTR (MISCELLANEOUS) ×3 IMPLANT
SUT MNCRL AB 4-0 PS2 18 (SUTURE) ×3 IMPLANT
SUT PROLENE 0 CT 1 30 (SUTURE) ×3 IMPLANT
SUTURE VIC 3-0 (SUTURE) ×3 IMPLANT

## 2017-11-21 NOTE — Op Note (Signed)
      [facility]       Operative Note  Date: 11/21/2017  Preoperative diagnosis:  1. Metastatic bladder cancer  Postoperative diagnosis:  Same as above  Procedures: #1. Ultrasound guidance for vascular access to the right internal jugular vein. #2. Fluoroscopic guidance for placement of catheter. #3. Placement of CT compatible Port-A-Cath, right internal jugular vein.  Surgeon: Leotis Pain, MD.   Anesthesia: Local with moderate conscious sedation for approximately 20  minutes using 3.5 mg of Versed and 75 mcg of Fentanyl  Fluoroscopy time: less than 1 minute  Contrast used: 0  Estimated blood loss: 5 cc  Indication for the procedure:  The patient is a 72 y.o.adult with metastatic bladder cancer.  The patient needs a Port-A-Cath for durable venous access, chemotherapy, lab draws, and CT scans. We are asked to place this. Risks and benefits were discussed and informed consent was obtained.  Description of procedure: The patient was brought to the vascular and interventional radiology suite.  Moderate conscious sedation was administered throughout the procedure during a face to face encounter with the patient with my supervision of the RN administering medicines and monitoring the patient's vital signs, pulse oximetry, telemetry and mental status throughout from the start of the procedure until the patient was taken to the recovery room. The right neck chest and shoulder were sterilely prepped and draped, and a sterile surgical field was created. Ultrasound was used to help visualize a patent right internal jugular vein. This was then accessed under direct ultrasound guidance without difficulty with the Seldinger needle and a permanent image was recorded. A J-wire was placed. After skin nick and dilatation, the peel-away sheath was then placed over the wire. I then anesthetized an area under the clavicle approximately 1-2 fingerbreadths. A transverse incision was created  and an inferior pocket was created with electrocautery and blunt dissection. The port was then brought onto the field, placed into the pocket and secured to the chest wall with 2 Prolene sutures. The catheter was connected to the port and tunneled from the subclavicular incision to the access site. Fluoroscopic guidance was then used to cut the catheter to an appropriate length. The catheter was then placed through the peel-away sheath and the peel-away sheath was removed. The catheter tip was parked in excellent location under fluorocoscopic guidance in the cavoatrial junction. The pocket was then irrigated with antibiotic impregnated saline and the wound was closed with a running 3-0 Vicryl and a 4-0 Monocryl. The access incision was closed with a single 4-0 Monocryl. The Huber needle was used to withdraw blood and flush the port with heparinized saline. Dermabond was then placed as a dressing. The patient tolerated the procedure well and was taken to the recovery room in stable condition.   Leotis Pain 11/21/2017 8:43 AM   This note was created with Dragon Medical transcription system. Any errors in dictation are purely unintentional.

## 2017-11-21 NOTE — H&P (Signed)
[facility] History & Physical Update  The patient was interviewed and re-examined.  The patient's previous History and Physical has been reviewed and is unchanged.  There is no change in the plan of care. We plan to proceed with the scheduled procedure.  Leotis Pain, MD  11/21/2017, 8:02 AM

## 2017-11-21 NOTE — Discharge Instructions (Signed)
Implanted Port Insertion, Care After °This sheet gives you information about how to care for yourself after your procedure. Your health care provider may also give you more specific instructions. If you have problems or questions, contact your health care provider. °What can I expect after the procedure? °After your procedure, it is common to have: °· Discomfort at the port insertion site. °· Bruising on the skin over the port. This should improve over 3-4 days. ° °Follow these instructions at home: °Port care °· After your port is placed, you will get a manufacturer's information card. The card has information about your port. Keep this card with you at all times. °· Take care of the port as told by your health care provider. Ask your health care provider if you or a family member can get training for taking care of the port at home. A home health care nurse may also take care of the port. °· Make sure to remember what type of port you have. °Incision care °· Follow instructions from your health care provider about how to take care of your port insertion site. Make sure you: °? Wash your hands with soap and water before you change your bandage (dressing). If soap and water are not available, use hand sanitizer. °? Change your dressing as told by your health care provider. °? Leave stitches (sutures), skin glue, or adhesive strips in place. These skin closures may need to stay in place for 2 weeks or longer. If adhesive strip edges start to loosen and curl up, you may trim the loose edges. Do not remove adhesive strips completely unless your health care provider tells you to do that. °· Check your port insertion site every day for signs of infection. Check for: °? More redness, swelling, or pain. °? More fluid or blood. °? Warmth. °? Pus or a bad smell. °General instructions °· Do not take baths, swim, or use a hot tub until your health care provider approves. °· Do not lift anything that is heavier than 10 lb (4.5  kg) for a week, or as told by your health care provider. °· Ask your health care provider when it is okay to: °? Return to work or school. °? Resume usual physical activities or sports. °· Do not drive for 24 hours if you were given a medicine to help you relax (sedative). °· Take over-the-counter and prescription medicines only as told by your health care provider. °· Wear a medical alert bracelet in case of an emergency. This will tell any health care providers that you have a port. °· Keep all follow-up visits as told by your health care provider. This is important. °Contact a health care provider if: °· You cannot flush your port with saline as directed, or you cannot draw blood from the port. °· You have a fever or chills. °· You have more redness, swelling, or pain around your port insertion site. °· You have more fluid or blood coming from your port insertion site. °· Your port insertion site feels warm to the touch. °· You have pus or a bad smell coming from the port insertion site. °Get help right away if: °· You have chest pain or shortness of breath. °· You have bleeding from your port that you cannot control. °Summary °· Take care of the port as told by your health care provider. °· Change your dressing as told by your health care provider. °· Keep all follow-up visits as told by your health care provider. °  This information is not intended to replace advice given to you by your health care provider. Make sure you discuss any questions you have with your health care provider. °Document Released: 08/08/2013 Document Revised: 09/08/2016 Document Reviewed: 09/08/2016 °Elsevier Interactive Patient Education © 2017 Elsevier Inc[employer] °Moderate Conscious Sedation, Adult, Care After °These instructions provide you with information about caring for yourself after your procedure. Your health care provider may also give you more specific instructions. Your treatment has been planned according to current medical  practices, but problems sometimes occur. Call your health care provider if you have any problems or questions after your procedure. °What can I expect after the procedure? °After your procedure, it is common: °· To feel sleepy for several hours. °· To feel clumsy and have poor balance for several hours. °· To have poor judgment for several hours. °· To vomit if you eat too soon. ° °Follow these instructions at home: °For at least 24 hours after the procedure: ° °· Do not: °? Participate in activities where you could fall or become injured. °? Drive. °? Use heavy machinery. °? Drink alcohol. °? Take sleeping pills or medicines that cause drowsiness. °? Make important decisions or sign legal documents. °? Take care of children on your own. °· Rest. °Eating and drinking °· Follow the diet recommended by your health care provider. °· If you vomit: °? Drink water, juice, or soup when you can drink without vomiting. °? Make sure you have little or no nausea before eating solid foods. °General instructions °· Have a responsible adult stay with you until you are awake and alert. °· Take over-the-counter and prescription medicines only as told by your health care provider. °· If you smoke, do not smoke without supervision. °· Keep all follow-up visits as told by your health care provider. This is important. °Contact a health care provider if: °· You keep feeling nauseous or you keep vomiting. °· You feel light-headed. °· You develop a rash. °· You have a fever. °Get help right away if: °· You have trouble breathing. °This information is not intended to replace advice given to you by your health care provider. Make sure you discuss any questions you have with your health care provider. °Document Released: 08/08/2013 Document Revised: 03/22/2016 Document Reviewed: 02/07/2016 °Elsevier Interactive Patient Education © 2018 Elsevier Inc. ° °

## 2017-11-23 ENCOUNTER — Telehealth: Payer: Self-pay | Admitting: Hematology and Oncology

## 2017-11-23 NOTE — Telephone Encounter (Signed)
We need more information on this. Where is patient going to have therapy? Has the [facility] approved it? I don't want this patient to run into payment issues if he has not gone through the appropriate referral channels.

## 2017-11-24 ENCOUNTER — Ambulatory Visit
Admission: RE | Admit: 2017-11-24 | Discharge: 2017-11-24 | Disposition: A | Discharge status: Home / Self Care | Payer: Medicare Other | Source: Ambulatory Visit | Attending: Radiation Oncology | Admitting: Radiation Oncology

## 2017-11-24 ENCOUNTER — Inpatient Hospital Stay (HOSPITAL_BASED_OUTPATIENT_CLINIC_OR_DEPARTMENT_OTHER): Payer: Medicare Other | Admitting: Hematology and Oncology

## 2017-11-24 ENCOUNTER — Other Ambulatory Visit: Payer: Self-pay

## 2017-11-24 ENCOUNTER — Encounter: Payer: Self-pay | Admitting: Radiation Oncology

## 2017-11-24 ENCOUNTER — Inpatient Hospital Stay: Payer: Medicare Other

## 2017-11-24 VITALS — BP 123/81 | HR 101

## 2017-11-24 VITALS — Temp 96.6°F | Resp 20 | Wt 124.2 lb

## 2017-11-24 VITALS — BP 123/87 | HR 75 | Temp 97.5°F | Wt 123.0 lb

## 2017-11-24 DIAGNOSIS — C7951 Secondary malignant neoplasm of bone: Secondary | ICD-10-CM | POA: Diagnosis not present

## 2017-11-24 DIAGNOSIS — Z7982 Long term (current) use of aspirin: Secondary | ICD-10-CM

## 2017-11-24 DIAGNOSIS — C672 Malignant neoplasm of lateral wall of bladder: Secondary | ICD-10-CM

## 2017-11-24 DIAGNOSIS — Z923 Personal history of irradiation: Secondary | ICD-10-CM | POA: Diagnosis not present

## 2017-11-24 DIAGNOSIS — C679 Malignant neoplasm of bladder, unspecified: Secondary | ICD-10-CM | POA: Diagnosis not present

## 2017-11-24 DIAGNOSIS — R0602 Shortness of breath: Secondary | ICD-10-CM

## 2017-11-24 DIAGNOSIS — Z79899 Other long term (current) drug therapy: Secondary | ICD-10-CM

## 2017-11-24 DIAGNOSIS — Z809 Family history of malignant neoplasm, unspecified: Secondary | ICD-10-CM

## 2017-11-24 DIAGNOSIS — Z800 Family history of malignant neoplasm of digestive organs: Secondary | ICD-10-CM

## 2017-11-24 DIAGNOSIS — I951 Orthostatic hypotension: Secondary | ICD-10-CM

## 2017-11-24 DIAGNOSIS — G893 Neoplasm related pain (acute) (chronic): Secondary | ICD-10-CM

## 2017-11-24 DIAGNOSIS — Z7189 Other specified counseling: Secondary | ICD-10-CM

## 2017-11-24 DIAGNOSIS — I739 Peripheral vascular disease, unspecified: Secondary | ICD-10-CM

## 2017-11-24 DIAGNOSIS — R001 Bradycardia, unspecified: Secondary | ICD-10-CM | POA: Diagnosis not present

## 2017-11-24 DIAGNOSIS — R918 Other nonspecific abnormal finding of lung field: Secondary | ICD-10-CM | POA: Diagnosis not present

## 2017-11-24 DIAGNOSIS — I10 Essential (primary) hypertension: Secondary | ICD-10-CM | POA: Diagnosis not present

## 2017-11-24 DIAGNOSIS — I471 Supraventricular tachycardia: Secondary | ICD-10-CM | POA: Diagnosis not present

## 2017-11-24 DIAGNOSIS — I959 Hypotension, unspecified: Secondary | ICD-10-CM | POA: Diagnosis not present

## 2017-11-24 DIAGNOSIS — R42 Dizziness and giddiness: Secondary | ICD-10-CM

## 2017-11-24 DIAGNOSIS — Z9221 Personal history of antineoplastic chemotherapy: Secondary | ICD-10-CM | POA: Diagnosis not present

## 2017-11-24 DIAGNOSIS — F17210 Nicotine dependence, cigarettes, uncomplicated: Secondary | ICD-10-CM

## 2017-11-24 DIAGNOSIS — Z5111 Encounter for antineoplastic chemotherapy: Secondary | ICD-10-CM

## 2017-11-24 LAB — COMPREHENSIVE METABOLIC PANEL
ALT: 26 U/L (ref 17–63)
AST: 25 U/L (ref 15–41)
Albumin: 3.4 g/dL — ABNORMAL LOW (ref 3.5–5.0)
Alkaline Phosphatase: 112 U/L (ref 38–126)
Anion gap: 8 (ref 5–15)
BUN: 7 mg/dL (ref 6–20)
CO2: 21 mmol/L — ABNORMAL LOW (ref 22–32)
Calcium: 8.3 mg/dL — ABNORMAL LOW (ref 8.9–10.3)
Chloride: 101 mmol/L (ref 101–111)
Creatinine, Ser: 0.52 mg/dL — ABNORMAL LOW (ref 0.61–1.24)
GFR calc Af Amer: >60 mL/min (ref >60)
GFR calc non Af Amer: >60 mL/min (ref >60)
Glucose, Bld: 105 mg/dL — ABNORMAL HIGH (ref 65–99)
Potassium: 4.5 mmol/L (ref 3.5–5.1)
Sodium: 130 mmol/L — ABNORMAL LOW (ref 135–145)
Total Bilirubin: 0.5 mg/dL (ref 0.3–1.2)
Total Protein: 7 g/dL (ref 6.5–8.1)

## 2017-11-24 LAB — CBC WITH DIFFERENTIAL/PLATELET
Basophils Absolute: 0 10*3/uL (ref 0–0.1)
Basophils Relative: 1 %
Eosinophils Absolute: 0 10*3/uL (ref 0–0.7)
Eosinophils Relative: 0 %
HCT: 33.4 % — ABNORMAL LOW (ref 40.0–52.0)
Hemoglobin: 11.3 g/dL — ABNORMAL LOW (ref 13.0–18.0)
Lymphocytes Relative: 17 %
Lymphs Abs: 0.9 10*3/uL — ABNORMAL LOW (ref 1.0–3.6)
MCH: 28.1 pg (ref 26.0–34.0)
MCHC: 33.7 g/dL (ref 32.0–36.0)
MCV: 83.2 fL (ref 80.0–100.0)
Monocytes Absolute: 0.7 10*3/uL (ref 0.2–1.0)
Monocytes Relative: 15 %
Neutro Abs: 3.4 10*3/uL (ref 1.4–6.5)
Neutrophils Relative %: 67 %
Platelets: 758 10*3/uL — ABNORMAL HIGH (ref 150–440)
RBC: 4.02 MIL/uL — ABNORMAL LOW (ref 4.40–5.90)
RDW: 16 % — ABNORMAL HIGH (ref 11.5–14.5)
WBC: 5 10*3/uL (ref 3.8–10.6)

## 2017-11-24 LAB — IRON AND TIBC
IRON: 51 ug/dL (ref 45–182)
SATURATION RATIOS: 20 % (ref 17.9–39.5)
TIBC: 259 ug/dL (ref 250–450)
UIBC: 208 ug/dL

## 2017-11-24 LAB — RETICULOCYTES
RBC.: 4.02 MIL/uL — ABNORMAL LOW (ref 4.40–5.90)
Retic Count, Absolute: 124.6 10*3/uL (ref 19.0–183.0)
Retic Ct Pct: 3.1 % (ref 0.4–3.1)

## 2017-11-24 LAB — FERRITIN: Ferritin: 227 ng/mL (ref 24–336)

## 2017-11-24 LAB — MAGNESIUM: Magnesium: 2.2 mg/dL (ref 1.7–2.4)

## 2017-11-24 LAB — VITAMIN B12: VITAMIN B 12: 243 pg/mL (ref 180–914)

## 2017-11-24 LAB — FOLATE: FOLATE: 8.5 ng/mL (ref >5.9)

## 2017-11-24 MED ORDER — GEMCITABINE HCL CHEMO INJECTION 1 GM/26.3ML
1400.0000 mg | Freq: Once | INTRAVENOUS | Status: AC
  Administered 2017-11-24: 1400 mg via INTRAVENOUS
  Filled 2017-11-24: qty 26.3

## 2017-11-24 MED ORDER — SODIUM CHLORIDE 0.9 % IV SOLN
Freq: Once | INTRAVENOUS | Status: AC
  Administered 2017-11-24: 11:00:00 via INTRAVENOUS
  Filled 2017-11-24: qty 1000

## 2017-11-24 MED ORDER — HEPARIN SOD (PORK) LOCK FLUSH 100 UNIT/ML IV SOLN
500.0000 [IU] | Freq: Once | INTRAVENOUS | Status: AC | PRN
  Administered 2017-11-24: 500 [IU]
  Filled 2017-11-24: qty 5

## 2017-11-24 MED ORDER — DEXAMETHASONE SODIUM PHOSPHATE 10 MG/ML IJ SOLN
10.0000 mg | Freq: Once | INTRAMUSCULAR | Status: AC
  Administered 2017-11-24: 10 mg via INTRAVENOUS
  Filled 2017-11-24: qty 1

## 2017-11-24 MED ORDER — SODIUM CHLORIDE 0.9% FLUSH
10.0000 mL | INTRAVENOUS | Status: DC | PRN
  Filled 2017-11-24: qty 10

## 2017-11-24 MED ORDER — LIDOCAINE-PRILOCAINE 2.5-2.5 % EX CREA: TOPICAL_CREAM | CUTANEOUS | 0 refills | Status: DC

## 2017-11-24 MED ORDER — DEXAMETHASONE SODIUM PHOSPHATE 100 MG/10ML IJ SOLN: 10.0000 mg | Freq: Once | INTRAMUSCULAR | Status: DC

## 2017-11-24 MED ORDER — SODIUM CHLORIDE 0.9 % IV SOLN
405.0000 mg | Freq: Once | INTRAVENOUS | Status: AC
  Administered 2017-11-24: 410 mg via INTRAVENOUS
  Filled 2017-11-24: qty 41

## 2017-11-24 MED ORDER — PALONOSETRON HCL INJECTION 0.25 MG/5ML
0.2500 mg | Freq: Once | INTRAVENOUS | Status: AC
  Administered 2017-11-24: 0.25 mg via INTRAVENOUS
  Filled 2017-11-24: qty 5

## 2017-11-24 NOTE — Progress Notes (Signed)
Radiation Oncology Follow up Note  Name: Patrick Hooper   Date:   11/24/2017 MRN:  [id number] DOB: 10/01/1946    This 72 y.o. adult presents to the clinic today for one-month follow-up status post palliative radiation therapy to his cervical and thoracic spine for stage IV bladder cancer  REFERRING PROVIDER: [facility] [place]*  HPI: Patient is now seen 1 month out having completed palliative ration therapy to his cervical and thoracic spine. For stage IV bladder cancer. Seen today in routine follow-up he is doing well. He is had an excellent response as far as pain is concerned. He has decreased his pain medication. He is currently receiving carboplatinum and gemcitabine by medical oncology. He is tolerating that well. He does have significant shortness of breath making ambulation difficult although he has no pain on ambulation. He specifically denies dysphagia or a focal neurologic deficits.  COMPLICATIONS OF TREATMENT: none  FOLLOW UP COMPLIANCE: keeps appointments   PHYSICAL EXAM:  Temp (!) 96.6 F (35.9 C)   Resp 20   Wt 124 lb 3.7 oz (56.4 kg)   BMI 17.33 kg/m  O well-developed thin male wheelchair-bound in NAD. No pain is elicited on deep palpation of his spine. Range of motion of his neck does not elicit pain. Motor sensory and DTR levels are equal and symmetric in the upper lower extremities. Well-developed well-nourished patient in NAD. HEENT reveals PERLA, EOMI, discs not visualized.  Oral cavity is clear. No oral mucosal lesions are identified. Neck is clear without evidence of cervical or supraclavicular adenopathy. Lungs are clear to A&P. Cardiac examination is essentially unremarkable with regular rate and rhythm without murmur rub or thrill. Abdomen is benign with no organomegaly or masses noted. Motor sensory and DTR levels are equal and symmetric in the upper and lower extremities. Cranial nerves II through XII are grossly intact. Proprioception is intact. No  peripheral adenopathy or edema is identified. No motor or sensory levels are noted. Crude visual fields are within normal range.  RADIOLOGY RESULTS: No current films for review  PLAN: Present time patient is achieved excellent palliation of the pain in his spine. I am turning follow-up care over to medical oncology where he will continue systemic treatment. I would be happy to reevaluate the patient any time should further treatment be indicated.  I would like to take this opportunity to thank you for allowing me to participate in the care of your patient.Noreene Filbert, MD

## 2017-11-24 NOTE — Progress Notes (Signed)
Patient states he is having constipation.  He is trying to drink more and use less pain medicine.  States he still feels "funny" when he stands up.

## 2017-11-24 NOTE — Progress Notes (Signed)
[facility] day:  11/24/2017   Chief Complaint: Patrick Hooper is a 72 y.o. adult with metastatic bladder cancer who is seen for assessment prior to day 1 of cycle #2 of carboplatin and gemcitabine.  HPI:  The patient was last seen in the medical oncology clinic on 11/11/2017.  At that time, he was no longer orthostatic.  Fluid intake had improved.  He denied any fever.  Pain was well controlled.  Exam was stable.  He received day 8 gemcitabine at 80% dose.  Labs on 01/17/20189 revealed a WBC 1100 and an ANC of 400.  Follow-up labs on 11/18/2017 revealed a WBC 1400 with an ANC of 400.  Platelet count was 40,000.  He received IVF on 11/17/2017 and 11/18/2017.  He underwent port-a-cath placement on 11/21/2017 by Dr. Lucky Cowboy.  During the interim, patient is doing "ok". He has continued vertiginous symptoms with position changes. Patient states, "When I stand up, I give out of breath very quick". Patient notes that he is only able to ambulate short distance prior to becoming very short of breath. Patient with a heart rate of 75 sitting, however it elevates to 129 when he stands.   He is eating "ok". He has lost 4 pounds. Patient denies pain in the clinic today.  He is taking stool softeners and Miralax.  He denies any lower extremity edema or pleuritic chest pain.   Past Medical History:  Diagnosis Date  . Bladder cancer ([place])   . Hypertension   . Lung cancer ([place]) 2016  . PVD (peripheral vascular disease) ([place])     Past Surgical History:  Procedure Laterality Date  . BLADDER SURGERY  08/12/2017  . BYPASS GRAFT FEMORAL/POPLITEAL W/VEIN  2009  . COLONOSCOPY  2013  . CYSTOSCOPY  08/12/2017  . IR ABDOMINAL AORTOBIFEMORAL CATHETER SERIALOGRAM  2009  . LOBECTOMY Right 09/2015  . PACEMAKER INSERTION  2003  . PORTA CATH INSERTION N/A 11/21/2017   Procedure: PORTA CATH INSERTION;  Surgeon: Algernon Huxley, MD;  Location: [facility];  Service:  Cardiovascular;  Laterality: N/A;    Family History  Problem Relation Age of Onset  . Cancer Mother   . Cancer Sister   . Cancer Maternal Grandmother     Social History:  reports that he has been smoking cigarettes.  He has a 55.00 pack-year smoking history. he has never used smokeless tobacco. He reports that he drinks alcohol. He reports that he does not use drugs.  He smokes 1 pack a day for > 55 years.  He used to drink 4 alcoholic beverages/day.  Now his alcohol intake is infrequent. 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 There is the potential of past exposure to radiation and/or toxins. He is registered with the [place] regarding his exposures.  He lives in [place].  The patient is accompanied by his girlfriend Optometrist) today.  Allergies:  Allergies  Allergen Reactions  . Cefazolin Rash and Shortness Of Breath  . Carvedilol Other (See Comments)    fatigue  . Chlorthalidone Other (See Comments)    Other reaction(s): Unknown  . Hydralazine     Other reaction(s): Unknown  . Hydrochlorothiazide Other (See Comments)    Current Medications: Current Outpatient Medications  Medication Sig Dispense Refill  . acetaminophen (TYLENOL) 500 MG tablet Take 500 mg by mouth every 6 (six) hours as needed for mild pain.     Marland Kitchen  aspirin EC 81 MG tablet Take 81 mg by mouth at bedtime.    . cholecalciferol (VITAMIN D) 400 units TABS tablet Take 800 Units by mouth daily.    . clopidogrel (PLAVIX) 75 MG tablet Take 75 mg by mouth at bedtime.     Marland Kitchen dexamethasone (DECADRON) 4 MG tablet Take 2 tablets (8 mg total) by mouth daily. Start the day after carboplatin chemotherapy for 2 days. 30 tablet 1  . docusate sodium (COLACE) 100 MG capsule Take 100 mg 2 (two) times daily by mouth.    . lactulose (CHRONULAC) 10 GM/15ML solution Take 45 mLs (30 g total) by mouth 2 (two) times daily as needed for mild constipation or severe constipation. 1892 mL 0  .  LORazepam (ATIVAN) 0.5 MG tablet Take 1 tablet (0.5 mg total) by mouth every 6 (six) hours as needed (Nausea or vomiting). 30 tablet 0  . mirtazapine (REMERON) 15 MG tablet Take 15 mg at bedtime by mouth.    . morphine (MS CONTIN) 15 MG 12 hr tablet Take 1 tablet (15 mg total) by mouth every 12 (twelve) hours. 30 tablet 0  . ondansetron (ZOFRAN) 4 MG tablet Take 1 tablet (4 mg total) by mouth every 6 (six) hours as needed for nausea. 20 tablet 0  . oxycodone (OXY-IR) 5 MG capsule Take 1 capsule (5 mg total) by mouth every 4 (four) hours as needed for pain. 30 capsule 0  . polyethylene glycol (MIRALAX / GLYCOLAX) packet Take 17 g by mouth daily. (Patient taking differently: Take 17 g by mouth daily as needed. ) 30 each 0  . potassium chloride (K-DUR) 10 MEQ tablet Take 1 tablet (10 mEq total) by mouth 2 (two) times daily. 20 tablet 0  . protein supplement shake (PREMIER PROTEIN) LIQD Take 325 mLs (11 oz total) by mouth 2 (two) times daily between meals. 60 Can 0  . megestrol (MEGACE ORAL) 40 MG/ML suspension Take 5 mLs (200 mg total) by mouth daily. (Patient not taking: Reported on 11/24/2017) 240 mL 0   No current facility-administered medications for this visit.    Facility-Administered Medications Ordered in Other Visits  Medication Dose Route Frequency Provider Last Rate Last Dose  . 0.9 %  sodium chloride infusion   Intravenous Continuous Verlon Au, NP 500 mL/hr at 10/31/17 1400      Review of Systems:  GENERAL:  Feels "better".  No fever or sweats.  Weight down 4 pounds since last visit.  PERFORMANCE STATUS (ECOG):  2 HEENT:  No visual changes, sore throat, mouth sores or tenderness. Lungs: Shortness of breath with exertion.  Cough.  No hemoptysis. Cardiac:  Implanted pacemaker (NOT MRI COMPATIBLE). No chest pain, palpitations, orthopnea, or PND.  Blood pressure fluctuates.  Off amlodipine. GI:  No nausea, vomiting, diarrhea, contipation, melena or hematochezia. GU:  Dribbling.   Urgency, frequency, dysuria and nocturia.  No hematuria. Musculoskeletal:  Chronic bone pain (lower ribs, shoulders, back, neck, shoulder), well controlled.  Joint pain (left wrist and right elbow). No muscle tenderness. Extremities:  No pain or swelling. Skin:  No rashes or skin changes. Neuro: No headache.  4th digit of left hand change in sensation.  No numbness or weakness, balance or coordination issues. Endocrine:  No diabetes, thyroid issues, hot flashes or night sweats. Psych:  No mood changes, depression or anxiety. Pain:  No pain. Review of systems:  All other systems reviewed and found to be negative.  Physical Exam: Blood pressure 123/87, pulse 75, temperature (!)  97.5 F (36.4 C), temperature source Tympanic, weight 123 lb (55.8 kg), SpO2 97 %. GENERAL:  Well developed, well nourished, gentleman sitting comfortably in a wheelchair in the exam room in no acute distress. MENTAL STATUS:  Alert and oriented to person, place and time. HEAD:  Thin gray hair.  Normocephalic, atraumatic, face symmetric, no Cushingoid features. EYES:  Blue eyes.  Pupils equal round and reactive to light and accomodation.  No conjunctivitis or scleral icterus. ENT:  Oropharynx clear without lesion.  Tongue normal.  Upper dentures.  No lower teeth.  Mucous membranes moist.  CHEST:  Left sided pacemaker. RESPIRATORY:  Clear to auscultation without rales, wheezes or rhonchi. CARDIOVASCULAR:  Regular rate and rhythm without murmur, rub or gallop. ABDOMEN:  Soft, non-tender, with active bowel sounds, and no hepatosplenomegaly.  No masses. SKIN:  No rashes, ulcers or lesions. EXTREMITIES: Nicotine stained nails.  No edema, no skin discoloration or tenderness.  No palpable cords. LYMPH NODES: No palpable cervical, supraclavicular, axillary or inguinal adenopathy  NEUROLOGICAL: Unremarkable. PSYCH:  Appropriate.   Appointment on 11/24/2017  Component Date Value Ref Range Status  . Magnesium 11/24/2017 2.2   1.7 - 2.4 mg/dL Final   Performed at [facility], [address] [place], [place] [address]  . Sodium 11/24/2017 130* 135 - 145 mmol/L Final  . Potassium 11/24/2017 4.5  3.5 - 5.1 mmol/L Final  . Chloride 11/24/2017 101  101 - 111 mmol/L Final  . CO2 11/24/2017 21* 22 - 32 mmol/L Final  . Glucose, Bld 11/24/2017 105* 65 - 99 mg/dL Final  . BUN 11/24/2017 7  6 - 20 mg/dL Final  . Creatinine, Ser 11/24/2017 0.52* 0.61 - 1.24 mg/dL Final  . Calcium 11/24/2017 8.3* 8.9 - 10.3 mg/dL Final  . Total Protein 11/24/2017 7.0  6.5 - 8.1 g/dL Final  . Albumin 11/24/2017 3.4* 3.5 - 5.0 g/dL Final  . AST 11/24/2017 25  15 - 41 U/L Final  . ALT 11/24/2017 26  17 - 63 U/L Final  . Alkaline Phosphatase 11/24/2017 112  38 - 126 U/L Final  . Total Bilirubin 11/24/2017 0.5  0.3 - 1.2 mg/dL Final  . GFR calc non Af Amer 11/24/2017 >60  >60 mL/min Final  . GFR calc Af Amer 11/24/2017 >60  >60 mL/min Final   Comment: (NOTE) The eGFR has been calculated using the CKD EPI equation. This calculation has not been validated in all clinical situations. eGFR's persistently <60 mL/min signify possible Chronic Kidney Disease.   Georgiann Hahn gap 11/24/2017 8  5 - 15 Final   Performed at [facility], [address] [place], [place] [address]  . WBC 11/24/2017 5.0  3.8 - 10.6 K/uL Final  . RBC 11/24/2017 4.02* 4.40 - 5.90 MIL/uL Final  . Hemoglobin 11/24/2017 11.3* 13.0 - 18.0 g/dL Final  . HCT 11/24/2017 33.4* 40.0 - 52.0 % Final  . MCV 11/24/2017 83.2  80.0 - 100.0 fL Final  . MCH 11/24/2017 28.1  26.0 - 34.0 pg Final  . MCHC 11/24/2017 33.7  32.0 - 36.0 g/dL Final  . RDW 11/24/2017 16.0* 11.5 - 14.5 % Final  . Platelets 11/24/2017 758* 150 - 440 K/uL Final  . Neutrophils Relative % 11/24/2017 67  % Final  . Neutro Abs 11/24/2017 3.4  1.4 - 6.5 K/uL Final  . Lymphocytes Relative 11/24/2017 17  % Final  . Lymphs Abs 11/24/2017 0.9* 1.0 - 3.6 K/uL Final  . Monocytes Relative 11/24/2017 15  % Final   .  Monocytes Absolute 11/24/2017 0.7  0.2 - 1.0 K/uL Final  . Eosinophils Relative 11/24/2017 0  % Final  . Eosinophils Absolute 11/24/2017 0.0  0 - 0.7 K/uL Final  . Basophils Relative 11/24/2017 1  % Final  . Basophils Absolute 11/24/2017 0.0  0 - 0.1 K/uL Final   Performed at [facility], [address] [place], [place] [address]    Assessment:  Patrick Hooper is a 72 y.o. adult with metastatic disease.  He has a recent history of muscle invasive bladder cancer and a distant history of lung cancer.  He has a history of stage IB lung cancer status post right upper lobe lobectomy in 09/2015.  Pathology revealed a [id number] adenocarcinoma.  Chest CT on 05/25/2017 revealed postoperative changes from a right upper lobe lobectomy without evidence of recurrence. There was new diffuse punctate sclerotic osseous lesions throughout the chest concerning for metastatic disease.  PET scan on 06/08/2017 at the [facility] revealed right upper lobe lobectomy without evidence of recurrence. There was a new 3.2 cm mass along the right inferior bladder wall, anterior to the ureteral vesicular junction.  There were multiple new focal sclerotic and some probable mixed lytic and sclerotic osseous lesions in the axial skeleton many of which demonstrated increased FDG avidity. Differential diagnoses included include metastatic lung cancer, bladder cancer or prostate cancer. There was increased FDG uptake in the left medial thigh musculature of uncertain etiology.  PET scan on 09/09/2017 revealed several hypermetabolic mixed lytic and sclerotic osseous metastases throughout the axial skeleton.  There was suspected intramuscular soft tissue metastases in the right quadratus lumborum and posterior left lumbar paraspinal musculature.  There was mildly hypermetabolic retroperitoneal lymphadenopathy, probably representing nodal metastases.  There was diffuse irregular bladder wall thickening, asymmetrically  prominent in the right bladder wall, suspicious for residual/recurrent bladder neoplasm.   There was nonspecific small ground-glass nodular opacity in the left upper lung lobe with low level metabolism.  There was no evidence of local tumor recurrence in the right lung status post right upper lobectomy   He underwent transurethral resection of bladder tumor (TURBT) with bilateral retrograde pyelogram on 08/12/2017.  He had a 3 cm bladder tumor on the right lateral wall with some sessile and papillary features.  Retrograde pyelograms were normal without filling defect.  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  He began monthly Xgeva on 09/16/2017 (last 10/27/2017).  He is day 1 of cycle #2 carboplatin and gemcitabine (11/03/2017 - 11/24/2017).  Cycle #1 was complicated by orthostatic hypotension and fatigue requiring IVF and delay in day 8 gemcitabine (80% dose).  He has had issues with hypotension despite a history of hypertension.  He is off blood pressure medications.  Cortisol level was normal on 10/27/2017.  He has a history of peripheral vascular disease s/p fem-fem bypass in 2016. He is on Plavix.  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  Colonoscopy was normal in 2011.  Symptomatically, he feels better.  He is orthostatic  He denies any fever.  Pain is well controlled.  Exam is stable. WBC is 5000 with an [place] 3400.  Hemoglobin is 11.3, hematocrit 33.4, and platelets 758,000.  Calcium is 8.3 (corrected 8.8).  Plan: 1.  Labs today:  CBC with diff, CMP, Mg, ferritin, iron studies, retic 2.  Calcium low. Will hold Xgeva today. Discuss need to add calcium 1200 mg and vitamin D 800 IU daily.  3.  Labs  reviewed. Blood counts stable and adequate enough for treatment. Will proceed with day 1 of cycle #2 carboplatin and gemcitabine (80% dose). 4.  Discuss orthostasis. Will give 1L NS today.  5.  RTC on 11/25/2017 for  IVFs 6.  RTC in 1 week for labs (CBC with diff, CMP, Mg), +/- IVF, Xgeva, and day 8 of cycle #2 gemcitabine. 7.  RTC in 2 weeks for labs (CBC with diff, BMP) +/- IVF. 8.  RTC in 3 weeks for MD assessment, labs (CBC with diff, CMP, Mg), +/- IVFs, and day 1 of cycle #3 carboplatin + gemcitabine.   Honor Loh, NP  11/24/2017, 10:35 AM   I saw and evaluated the patient, participating in the key portions of the service and reviewing pertinent diagnostic studies and records.  I reviewed the nurse practitioner's note and agree with the findings and the plan.  The assessment and plan were discussed with the patient.  A few questions were asked by the patient and answered.   Nolon Stalls, MD 11/24/2017,10:35 AM

## 2017-11-25 ENCOUNTER — Other Ambulatory Visit: Payer: Self-pay | Admitting: Urgent Care

## 2017-11-25 ENCOUNTER — Ambulatory Visit: Payer: Non-veteran care | Admitting: Radiation Oncology

## 2017-11-25 ENCOUNTER — Inpatient Hospital Stay: Payer: Medicare Other

## 2017-11-25 VITALS — BP 129/78 | HR 91

## 2017-11-25 DIAGNOSIS — Z95828 Presence of other vascular implants and grafts: Secondary | ICD-10-CM

## 2017-11-25 DIAGNOSIS — Z5111 Encounter for antineoplastic chemotherapy: Secondary | ICD-10-CM | POA: Diagnosis not present

## 2017-11-25 DIAGNOSIS — C7951 Secondary malignant neoplasm of bone: Secondary | ICD-10-CM

## 2017-11-25 MED ORDER — SODIUM CHLORIDE 0.9 % IV SOLN
Freq: Once | INTRAVENOUS | Status: AC
Start: 2017-11-25 — End: 2017-11-25
  Administered 2017-11-25: 14:00:00 via INTRAVENOUS
  Filled 2017-11-25: qty 1000

## 2017-11-25 MED ORDER — HEPARIN SOD (PORK) LOCK FLUSH 100 UNIT/ML IV SOLN
INTRAVENOUS | Status: AC
  Filled 2017-11-25: qty 5

## 2017-11-25 MED ORDER — HEPARIN SOD (PORK) LOCK FLUSH 100 UNIT/ML IV SOLN
500.0000 [IU] | Freq: Once | INTRAVENOUS | Status: AC
  Administered 2017-11-25: 500 [IU]

## 2017-11-27 ENCOUNTER — Encounter: Payer: Self-pay | Admitting: Hematology and Oncology

## 2017-11-28 ENCOUNTER — Emergency Department
Admission: EM | Admit: 2017-11-28 | Discharge: 2017-11-29 | Disposition: A | Discharge status: Home / Self Care | Payer: Medicare Other | Attending: Emergency Medicine | Admitting: Emergency Medicine

## 2017-11-28 ENCOUNTER — Telehealth: Payer: Self-pay | Admitting: *Deleted

## 2017-11-28 ENCOUNTER — Other Ambulatory Visit: Payer: Self-pay

## 2017-11-28 ENCOUNTER — Other Ambulatory Visit: Payer: Self-pay | Admitting: Urgent Care

## 2017-11-28 ENCOUNTER — Inpatient Hospital Stay: Payer: Medicare Other

## 2017-11-28 ENCOUNTER — Inpatient Hospital Stay (HOSPITAL_BASED_OUTPATIENT_CLINIC_OR_DEPARTMENT_OTHER): Payer: Medicare Other | Admitting: Nurse Practitioner

## 2017-11-28 ENCOUNTER — Encounter: Payer: Self-pay | Admitting: Emergency Medicine

## 2017-11-28 ENCOUNTER — Ambulatory Visit
Admission: RE | Admit: 2017-11-28 | Discharge: 2017-11-28 | Disposition: A | Discharge status: Home / Self Care | Payer: Medicare Other | Source: Ambulatory Visit | Attending: Urgent Care | Admitting: Urgent Care

## 2017-11-28 VITALS — BP 126/83 | HR 101 | Temp 96.7°F | Resp 22

## 2017-11-28 DIAGNOSIS — R634 Abnormal weight loss: Secondary | ICD-10-CM

## 2017-11-28 DIAGNOSIS — I739 Peripheral vascular disease, unspecified: Secondary | ICD-10-CM

## 2017-11-28 DIAGNOSIS — F17210 Nicotine dependence, cigarettes, uncomplicated: Secondary | ICD-10-CM | POA: Diagnosis not present

## 2017-11-28 DIAGNOSIS — R7989 Other specified abnormal findings of blood chemistry: Secondary | ICD-10-CM

## 2017-11-28 DIAGNOSIS — I959 Hypotension, unspecified: Secondary | ICD-10-CM | POA: Diagnosis not present

## 2017-11-28 DIAGNOSIS — Z95828 Presence of other vascular implants and grafts: Secondary | ICD-10-CM

## 2017-11-28 DIAGNOSIS — Z7982 Long term (current) use of aspirin: Secondary | ICD-10-CM | POA: Diagnosis not present

## 2017-11-28 DIAGNOSIS — R531 Weakness: Secondary | ICD-10-CM

## 2017-11-28 DIAGNOSIS — K5900 Constipation, unspecified: Secondary | ICD-10-CM

## 2017-11-28 DIAGNOSIS — R338 Other retention of urine: Secondary | ICD-10-CM | POA: Diagnosis not present

## 2017-11-28 DIAGNOSIS — C679 Malignant neoplasm of bladder, unspecified: Secondary | ICD-10-CM

## 2017-11-28 DIAGNOSIS — Z809 Family history of malignant neoplasm, unspecified: Secondary | ICD-10-CM

## 2017-11-28 DIAGNOSIS — C3411 Malignant neoplasm of upper lobe, right bronchus or lung: Secondary | ICD-10-CM

## 2017-11-28 DIAGNOSIS — R918 Other nonspecific abnormal finding of lung field: Secondary | ICD-10-CM

## 2017-11-28 DIAGNOSIS — C7951 Secondary malignant neoplasm of bone: Secondary | ICD-10-CM

## 2017-11-28 DIAGNOSIS — C672 Malignant neoplasm of lateral wall of bladder: Secondary | ICD-10-CM

## 2017-11-28 DIAGNOSIS — I10 Essential (primary) hypertension: Secondary | ICD-10-CM | POA: Insufficient documentation

## 2017-11-28 DIAGNOSIS — R1084 Generalized abdominal pain: Secondary | ICD-10-CM | POA: Insufficient documentation

## 2017-11-28 DIAGNOSIS — Z9889 Other specified postprocedural states: Secondary | ICD-10-CM | POA: Insufficient documentation

## 2017-11-28 DIAGNOSIS — R0602 Shortness of breath: Secondary | ICD-10-CM

## 2017-11-28 DIAGNOSIS — K5904 Chronic idiopathic constipation: Secondary | ICD-10-CM | POA: Insufficient documentation

## 2017-11-28 DIAGNOSIS — R001 Bradycardia, unspecified: Secondary | ICD-10-CM

## 2017-11-28 DIAGNOSIS — G893 Neoplasm related pain (acute) (chronic): Secondary | ICD-10-CM | POA: Diagnosis not present

## 2017-11-28 DIAGNOSIS — R42 Dizziness and giddiness: Secondary | ICD-10-CM

## 2017-11-28 DIAGNOSIS — E860 Dehydration: Secondary | ICD-10-CM | POA: Diagnosis not present

## 2017-11-28 DIAGNOSIS — Z5111 Encounter for antineoplastic chemotherapy: Secondary | ICD-10-CM | POA: Diagnosis not present

## 2017-11-28 DIAGNOSIS — Z79899 Other long term (current) drug therapy: Secondary | ICD-10-CM | POA: Diagnosis not present

## 2017-11-28 DIAGNOSIS — I471 Supraventricular tachycardia: Secondary | ICD-10-CM

## 2017-11-28 DIAGNOSIS — R339 Retention of urine, unspecified: Secondary | ICD-10-CM

## 2017-11-28 DIAGNOSIS — Z800 Family history of malignant neoplasm of digestive organs: Secondary | ICD-10-CM

## 2017-11-28 LAB — CBC WITH DIFFERENTIAL/PLATELET
BASOS ABS: 0.1 10*3/uL (ref 0–0.1)
BASOS PCT: 1 %
Eosinophils Absolute: 0 10*3/uL (ref 0–0.7)
Eosinophils Relative: 0 %
HEMATOCRIT: 32 % — AB (ref 40.0–52.0)
Hemoglobin: 10.9 g/dL — ABNORMAL LOW (ref 13.0–18.0)
LYMPHS PCT: 11 %
Lymphs Abs: 0.5 10*3/uL — ABNORMAL LOW (ref 1.0–3.6)
MCH: 28.7 pg (ref 26.0–34.0)
MCHC: 34.2 g/dL (ref 32.0–36.0)
MCV: 83.9 fL (ref 80.0–100.0)
MONO ABS: 0 10*3/uL — AB (ref 0.2–1.0)
Monocytes Relative: 1 %
NEUTROS ABS: 3.7 10*3/uL (ref 1.4–6.5)
Neutrophils Relative %: 87 %
PLATELETS: 955 10*3/uL — AB (ref 150–400)
RBC: 3.81 MIL/uL — AB (ref 4.40–5.90)
RDW: 16.3 % — AB (ref 11.5–14.5)
WBC: 4.3 10*3/uL (ref 3.8–10.6)

## 2017-11-28 LAB — BASIC METABOLIC PANEL
ANION GAP: 10 (ref 5–15)
BUN: 21 mg/dL — ABNORMAL HIGH (ref 6–20)
CALCIUM: 8.5 mg/dL — AB (ref 8.9–10.3)
CO2: 22 mmol/L (ref 22–32)
Chloride: 97 mmol/L — ABNORMAL LOW (ref 101–111)
Creatinine, Ser: 0.64 mg/dL (ref 0.61–1.24)
Glucose, Bld: 132 mg/dL — ABNORMAL HIGH (ref 65–99)
POTASSIUM: 3.8 mmol/L (ref 3.5–5.1)
SODIUM: 129 mmol/L — AB (ref 135–145)

## 2017-11-28 LAB — FIBRIN DERIVATIVES D-DIMER (ARMC ONLY): Fibrin derivatives D-dimer (ARMC): 7218.25 ng/mL (FEU) — ABNORMAL HIGH (ref 0.00–499.00)

## 2017-11-28 LAB — MAGNESIUM: MAGNESIUM: 1.9 mg/dL (ref 1.7–2.4)

## 2017-11-28 MED ORDER — MAGNESIUM CITRATE PO SOLN
1.0000 | Freq: Once | ORAL | Status: AC
  Administered 2017-11-28: 1 via ORAL
  Filled 2017-11-28: qty 296

## 2017-11-28 MED ORDER — SODIUM CHLORIDE 0.9% FLUSH
10.0000 mL | INTRAVENOUS | Status: DC | PRN
  Filled 2017-11-28: qty 10

## 2017-11-28 MED ORDER — CYANOCOBALAMIN 1000 MCG/ML IJ SOLN
1000.0000 ug | Freq: Once | INTRAMUSCULAR | Status: AC
  Administered 2017-11-28: 1000 ug via INTRAMUSCULAR
  Filled 2017-11-28: qty 1

## 2017-11-28 MED ORDER — HEPARIN SOD (PORK) LOCK FLUSH 100 UNIT/ML IV SOLN: 500.0000 [IU] | Freq: Once | INTRAVENOUS | Status: DC | PRN

## 2017-11-28 MED ORDER — LIDOCAINE VISCOUS 2 % MT SOLN
OROMUCOSAL | Status: AC
  Administered 2017-11-28: via TOPICAL
  Filled 2017-11-28: qty 15

## 2017-11-28 MED ORDER — DOCUSATE SODIUM 100 MG PO CAPS
100.0000 mg | ORAL_CAPSULE | Freq: Once | ORAL | Status: AC
  Administered 2017-11-28: 100 mg via ORAL
  Filled 2017-11-28: qty 1

## 2017-11-28 MED ORDER — IOPAMIDOL (ISOVUE-370) INJECTION 76%
75.0000 mL | Freq: Once | INTRAVENOUS | Status: AC | PRN
  Administered 2017-11-28: 75 mL via INTRAVENOUS

## 2017-11-28 MED ORDER — SODIUM CHLORIDE 0.9 % IV SOLN
Freq: Once | INTRAVENOUS | Status: AC
  Administered 2017-11-28: 14:00:00 via INTRAVENOUS
  Filled 2017-11-28: qty 1000

## 2017-11-28 MED ORDER — MINERAL OIL RE ENEM
1.0000 | ENEMA | Freq: Once | RECTAL | Status: AC
Start: 2017-11-28 — End: 2017-11-28
  Administered 2017-11-28: 1 via RECTAL

## 2017-11-28 NOTE — Telephone Encounter (Signed)
All from [facility] stating no bowel movement in 1 week. Abdomen bloated, has been trying to have bowel movement times 3 days with nothing resulting. Please advise

## 2017-11-28 NOTE — ED Provider Notes (Signed)
[facility] Emergency Department Provider Note   ____________________________________________   I have reviewed the triage vital signs and the nursing notes.   HISTORY  Chief Complaint Constipation and Urinary Retention   History limited by: Not Limited   HPI Patrick Hooper is a 72 y.o. male who presents to the emergency department today from oncology clinic because of concerns for elevated d-dimer.  Patient has been going for infusions.  Blood work with today he was found to have elevated d-dimer.  Patient's main complaint however is for constipation.  He states he has because been for quite some time.  He is able to get a small amount of stool out although states he has pain when he strains.  He has been trying stool softeners and laxatives without significant relief.    Per medical record review patient has a history of bladder cancer  Past Medical History:  Diagnosis Date  . Bladder cancer ([place])   . Hypertension   . Lung cancer ([place]) 2016  . PVD (peripheral vascular disease) [facility])     Patient Active Problem List   Diagnosis Date Noted  . Encounter for antineoplastic chemotherapy 11/03/2017  . Pressure injury of skin 10/13/2017  . Hypotension 10/13/2017  . Weakness 10/12/2017  . Cancer-related pain 09/27/2017  . 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 Goals of care, counseling/discussion 09/12/2017  . 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 Malignant neoplasm of upper lobe of right lung ([place]) 09/07/2017  . Bone metastasis ([place]) 09/07/2017    Past Surgical History:  Procedure Laterality Date  . BLADDER SURGERY  08/12/2017  . BYPASS GRAFT FEMORAL/POPLITEAL W/VEIN  2009  . COLONOSCOPY  2013  . CYSTOSCOPY  08/12/2017  . IR ABDOMINAL AORTOBIFEMORAL CATHETER SERIALOGRAM  2009  . LOBECTOMY Right 09/2015  . PACEMAKER INSERTION  2003  . PORTA CATH INSERTION N/A 11/21/2017   Procedure: PORTA CATH  INSERTION;  Surgeon: Algernon Huxley, MD;  Location: [place] [facility];  Service: Cardiovascular;  Laterality: N/A;    Prior to Admission medications   Medication Sig Start Date End Date Taking? Authorizing Provider  acetaminophen (TYLENOL) 500 MG tablet Take 500 mg by mouth every 6 (six) hours as needed for mild pain.     [provider]  aspirin EC 81 MG tablet Take 81 mg by mouth at bedtime.    [provider]  cholecalciferol (VITAMIN D) 400 units TABS tablet Take 800 Units by mouth daily.    [provider]  clopidogrel (PLAVIX) 75 MG tablet Take 75 mg by mouth at bedtime.     [provider]  dexamethasone (DECADRON) 4 MG tablet Take 2 tablets (8 mg total) by mouth daily. Start the day after carboplatin chemotherapy for 2 days. 11/03/17   Lequita Asal, MD  docusate sodium (COLACE) 100 MG capsule Take 100 mg 2 (two) times daily by mouth.    [provider]  lactulose (CHRONULAC) 10 GM/15ML solution Take 45 mLs (30 g total) by mouth 2 (two) times daily as needed for mild constipation or severe constipation. 10/14/17   Loletha Grayer, MD  lidocaine-prilocaine (EMLA) cream Apply 1 application topically PRN 1 hour prior to procedure. Cover with press and seal until treatment. 11/24/17   [name], NP  LORazepam (ATIVAN) 0.5 MG tablet Take 1 tablet (0.5 mg total) by mouth every 6 (six) hours as needed (Nausea or vomiting). 11/03/17   Lequita Asal, MD  megestrol (MEGACE ORAL) 40 MG/ML suspension Take 5 mLs (200 mg total) by mouth daily. 10/06/17   [name], NP  mirtazapine (REMERON) 15 MG tablet Take 15 mg at bedtime by mouth.    [provider]  morphine (MS CONTIN) 15 MG 12 hr tablet Take 1 tablet (15 mg total) by mouth every 12 (twelve) hours. 11/11/17   [name], NP  ondansetron (ZOFRAN) 4 MG tablet Take 1 tablet (4 mg total) by mouth every 6 (six) hours as needed for nausea. 10/14/17   Loletha Grayer, MD  oxycodone  (OXY-IR) 5 MG capsule Take 1 capsule (5 mg total) by mouth every 4 (four) hours as needed for pain. 10/24/17   [name], NP  polyethylene glycol (MIRALAX / GLYCOLAX) packet Take 17 g by mouth daily. Patient taking differently: Take 17 g by mouth daily as needed.  10/15/17   Loletha Grayer, MD  potassium chloride (K-DUR) 10 MEQ tablet Take 1 tablet (10 mEq total) by mouth 2 (two) times daily. 11/18/17   Lequita Asal, MD  protein supplement shake (PREMIER PROTEIN) LIQD Take 325 mLs (11 oz total) by mouth 2 (two) times daily between meals. 10/14/17   Loletha Grayer, MD    Allergies Cefazolin; Carvedilol; Chlorthalidone; Hydralazine; and Hydrochlorothiazide  Family History  Problem Relation Age of Onset  . Cancer Mother   . Cancer Sister   . Cancer Maternal Grandmother     Social History Social History   Tobacco Use  . Smoking status: Current Every Day Smoker    Packs/day: 1.00    Years: 55.00    Pack years: 55.00    Types: Cigarettes  . Smokeless tobacco: Never Used  Substance Use Topics  . Alcohol use: Yes    Comment: Occasional beer  . Drug use: No    Review of Systems Constitutional: Positive for weakness Eyes: No visual changes. ENT: No sore throat. Cardiovascular: Denies chest pain. Respiratory: Denies shortness of breath. Gastrointestinal: Positive for constipation Genitourinary: Negative for dysuria. Musculoskeletal: Negative for acute back pain. Skin: Negative for rash. Neurological: Negative for headaches, focal weakness or numbness.  ____________________________________________   PHYSICAL EXAM:  VITAL SIGNS: ED Triage Vitals  Enc Vitals Group     BP 11/28/17 1806 112/69     Pulse Rate 11/28/17 1806 (!) 117     Resp 11/28/17 1806 20     Temp 11/28/17 1806 (!) 97.5 F (36.4 C)     Temp Source 11/28/17 1806 Oral     SpO2 11/28/17 1806 99 %     Weight 11/28/17 1803 120 lb (54.4 kg)     Height 11/28/17 1803 5\' 11"  (1.803 m)    Constitutional: Alert and oriented. Eyes: Conjunctivae are normal.  ENT   Head: Normocephalic and atraumatic.   Nose: No congestion/rhinnorhea.   Mouth/Throat: Mucous membranes are moist.   Neck: No stridor. Hematological/Lymphatic/Immunilogical: No cervical lymphadenopathy. Cardiovascular: Normal rate, regular rhythm.  No murmurs, rubs, or gallops.  Respiratory: Normal respiratory effort without tachypnea nor retractions. Breath sounds are clear and equal bilaterally. No wheezes/rales/rhonchi. Gastrointestinal: Soft and non tender. No rebound. No guarding.  Genitourinary: Deferred Musculoskeletal: Normal range of motion in all extremities. No lower extremity edema. Neurologic:  Normal speech and language. No gross focal neurologic deficits are appreciated.  Skin:  Skin is warm, dry and intact. No rash noted. Psychiatric: Mood and affect are normal. Speech and behavior are normal. Patient exhibits appropriate insight and judgment.  ____________________________________________    LABS (  pertinent positives/negatives)  None  ____________________________________________   EKG  None  ____________________________________________    RADIOLOGY  CT angio performed as outpatient reviewed  ____________________________________________   PROCEDURES  Procedures  ____________________________________________   INITIAL IMPRESSION / ASSESSMENT AND PLAN / ED COURSE  Pertinent labs & imaging results that were available during my care of the patient were reviewed by me and considered in my medical decision making (see chart for details).  Patient presented to the emergency department today because of concerns for elevated d-dimer.  CT scan ordered by oncology clinic did not show pulmonary embolism but does show a large amount of stool.  I did a rectal exam and there was some stool in the vault but not a large stool ball low in the vault suggestive of a distal fecal  impaction.  Will give the patient an enema.    ____________________________________________   FINAL CLINICAL IMPRESSION(S) / ED DIAGNOSES  Final diagnoses:  Constipation, unspecified constipation type     Note: This dictation was prepared with Dragon dictation. Any transcriptional errors that result from this process are unintentional     Nance Pear, MD 11/29/17 0040

## 2017-11-28 NOTE — ED Notes (Signed)
Pt holding enema. Wife and pt know to call RN when pt needs toilet. Bed moved beside the commode.

## 2017-11-28 NOTE — ED Notes (Signed)
Family at bedside. 

## 2017-11-28 NOTE — ED Triage Notes (Signed)
Sent from cancer center for constipation and urinary retention.  Has tried OTC medication but no relief. Last BM was 1 week.  Last urinated this morning per pt.  Has "dribbled" this afternoon but not went.  Had labs and CT scans done as outpatient.  Ct shows constipation.  No PE despite elevated d dimer.  Urine ordered since not sent from [facility] center but labs not repeated.

## 2017-11-28 NOTE — Telephone Encounter (Signed)
Patient is already taking Colace twice a day and has been Miralax intermittently He has several bottles of Lactulose on hand and she is asking if he can use that, but has not taken it. I told her it would be fine to take Lactulose and to hold the Miralax if he takes the lactulose

## 2017-11-28 NOTE — Telephone Encounter (Signed)
He is coming in today for fluids and a B12 injection I believe. I believe that this is OIC, as he is on opioid pain medications. He needs to be on Colace 1 - 2 times a day. He can take Miralax as well until he has a bowel movement.

## 2017-11-28 NOTE — Progress Notes (Signed)
Symptom Management Consult note [facility]  Telephone:([phone] [phone]  Patient Care Team: Elby Beck, FNP as PCP - General (Nurse Practitioner)   Name of the patient: Patrick Hooper  [id number]  10/03/1946   Date of visit: 11/28/17  Diagnosis-metastatic bladder cancer  Chief complaint/ Reason for visit- urinary retention & constipation  Heme/Onc history: Patient last seen by primary oncologist, Dr. Mike Gip, on 11/24/17.  Patient has a recent history of muscle invasive bladder cancer and distant history of lung cancer.  He has a history of stage Ib lung cancer status post right upper lobectomy in 11/16.  Pathology revealed T2 a N0 M0 adenocarcinoma.  Chest CT on 05/25/17 revealed postoperative changes of right upper lobe without evidence of recurrence.  Noted to be new diffuse punctate sclerotic osseous lesions of the chest concerning for metastatic disease.  PET scan on 06/08/17 at [facility] revealed new 3.2 cm mass along the right inferior bladder wall anterior to the urethral vesicular junction.  Multiple lytic and sclerotic osseous lesions in the axial skeleton.  Increased uptake in left medial thigh musculature.  PET scan on 09/09/17 revealed osseous metastasis throughout axial skeleton and suspected intramuscular soft tissue metastasis.  Mildly hypermetabolic retroperitoneal lymphadenopathy representing probable nodal metastasis.  He underwent transurethral resection of bladder tumor and bilateral retrograde pyelogram on 08/12/17.  He had a 3 cm bladder tumor on the right lateral wall with some sessile and papillary features.  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  Begin monthly Xgeva on 09/16/17.  Initiated carboplatin and gemcitabine on 11/03/17.  He suffered orthostatic hypotension and fatigue requiring IV fluids and delayed day 8 gemcitabine which was dose reduced to 80%.  Blood pressure medicines were  stopped.  Cortisol level was normal on 10/27/17.  History of peripheral vascular disease status post femorofemoral bypass in 2016.  On Plavix.  Bradycardic with pacemaker.  History of paroxysmal SVT. Port placed on 11/21/17 by Dr. Lucky Cowboy.   Interval history-patient presents to symptom management clinic for concerns of constipation.  Asked to evaluate patient by primary oncologist, Dr. Mike Gip.  Patient was receiving fluids as previously scheduled.  Today complains of constipation.  Is a chronic problem and has gradually worsened over the past 1-2 weeks.  Estimates 1 bowel movement per week.  Complains of straining and pain.  Does pass small amount of stool.  Currently taking Colace twice daily and MiraLAX intermittently. Wife also reports urinary retention.  She estimates that her husband has urinated minimal amounts.  Oncology had felt that patient was dehydrated hence IV fluids.  Wife expresses that urinary retention started this morning.  ECOG FS:2 - Symptomatic, <50% confined to bed  Review of systems Constitutional: Positive for weakness.  Weight loss.  Alert.  Pain. Eyes: No visual changes. ENT: No sore throat. Cardiovascular: Denies chest pain. Respiratory: Shortness of breath with exertion.  Cough.  No hemoptysis. Gastrointestinal: Positive for constipation Genitourinary: Triple.  Has urgency, frequency, dysuria, and nocturia.  Denies hematuria. Musculoskeletal: Chronic bone pain. Skin: Negative for rash. Neurological: No headache.  Weakness.   Current treatment-carbo + Gem. Xgeva (last 10/27/17)  Allergies  Allergen Reactions  . Cefazolin Rash and Shortness Of Breath  . Carvedilol Other (See Comments)    fatigue  . Chlorthalidone Other (See Comments)    Other reaction(s): Unknown  . Hydralazine     Other reaction(s): Unknown  . Hydrochlorothiazide Other (See Comments)     Past Medical History:  Diagnosis  Date  . Bladder cancer ([place])   . Hypertension   . Lung cancer ([place])  2016  . PVD (peripheral vascular disease) ([place])      Past Surgical History:  Procedure Laterality Date  . BLADDER SURGERY  08/12/2017  . BYPASS GRAFT FEMORAL/POPLITEAL W/VEIN  2009  . COLONOSCOPY  2013  . CYSTOSCOPY  08/12/2017  . IR ABDOMINAL AORTOBIFEMORAL CATHETER SERIALOGRAM  2009  . LOBECTOMY Right 09/2015  . PACEMAKER INSERTION  2003  . PORTA CATH INSERTION N/A 11/21/2017   Procedure: PORTA CATH INSERTION;  Surgeon: Algernon Huxley, MD;  Location: [facility];  Service: Cardiovascular;  Laterality: N/A;    Social History   Socioeconomic History  . Marital status: Significant Other    Spouse name: Not on file  . Number of children: Not on file  . Years of education: Not on file  . Highest education level: Not on file  Social Needs  . Financial resource strain: Not on file  . Food insecurity - worry: Not on file  . Food insecurity - inability: Not on file  . Transportation needs - medical: Not on file  . Transportation needs - non-medical: Not on file  Occupational History  . Not on file  Tobacco Use  . Smoking status: Current Every Day Smoker    Packs/day: 1.00    Years: 55.00    Pack years: 55.00    Types: Cigarettes  . Smokeless tobacco: Never Used  Substance and Sexual Activity  . Alcohol use: Yes    Comment: Occasional beer  . Drug use: No  . Sexual activity: No  Other Topics Concern  . Not on file  Social History Narrative  . Not on file    Family History  Problem Relation Age of Onset  . Cancer Mother   . Cancer Sister   . Cancer Maternal Grandmother      Current Outpatient Medications:  .  acetaminophen (TYLENOL) 500 MG tablet, Take 500 mg by mouth every 6 (six) hours as needed for mild pain. , Disp: , Rfl:  .  aspirin EC 81 MG tablet, Take 81 mg by mouth at bedtime., Disp: , Rfl:  .  cholecalciferol (VITAMIN D) 400 units TABS tablet, Take 800 Units by mouth daily., Disp: , Rfl:  .  clopidogrel (PLAVIX) 75 MG tablet, Take 75 mg by mouth  at bedtime. , Disp: , Rfl:  .  dexamethasone (DECADRON) 4 MG tablet, Take 2 tablets (8 mg total) by mouth daily. Start the day after carboplatin chemotherapy for 2 days., Disp: 30 tablet, Rfl: 1 .  docusate sodium (COLACE) 100 MG capsule, Take 100 mg 2 (two) times daily by mouth., Disp: , Rfl:  .  lactulose (CHRONULAC) 10 GM/15ML solution, Take 45 mLs (30 g total) by mouth 2 (two) times daily as needed for mild constipation or severe constipation., Disp: 1892 mL, Rfl: 0 .  lidocaine-prilocaine (EMLA) cream, Apply 1 application topically PRN 1 hour prior to procedure. Cover with press and seal until treatment., Disp: 30 g, Rfl: 0 .  LORazepam (ATIVAN) 0.5 MG tablet, Take 1 tablet (0.5 mg total) by mouth every 6 (six) hours as needed (Nausea or vomiting)., Disp: 30 tablet, Rfl: 0 .  megestrol (MEGACE ORAL) 40 MG/ML suspension, Take 5 mLs (200 mg total) by mouth daily. (Patient not taking: Reported on 11/24/2017), Disp: 240 mL, Rfl: 0 .  mirtazapine (REMERON) 15 MG tablet, Take 15 mg at bedtime by mouth., Disp: , Rfl:  .  morphine (MS CONTIN) 15 MG 12 hr tablet, Take 1 tablet (15 mg total) by mouth every 12 (twelve) hours., Disp: 30 tablet, Rfl: 0 .  ondansetron (ZOFRAN) 4 MG tablet, Take 1 tablet (4 mg total) by mouth every 6 (six) hours as needed for nausea., Disp: 20 tablet, Rfl: 0 .  oxycodone (OXY-IR) 5 MG capsule, Take 1 capsule (5 mg total) by mouth every 4 (four) hours as needed for pain., Disp: 30 capsule, Rfl: 0 .  polyethylene glycol (MIRALAX / GLYCOLAX) packet, Take 17 g by mouth daily. (Patient taking differently: Take 17 g by mouth daily as needed. ), Disp: 30 each, Rfl: 0 .  potassium chloride (K-DUR) 10 MEQ tablet, Take 1 tablet (10 mEq total) by mouth 2 (two) times daily., Disp: 20 tablet, Rfl: 0 .  protein supplement shake (PREMIER PROTEIN) LIQD, Take 325 mLs (11 oz total) by mouth 2 (two) times daily between meals., Disp: 60 Can, Rfl: 0 No current facility-administered medications for  this visit.   Facility-Administered Medications Ordered in Other Visits:  .  0.9 %  sodium chloride infusion, , Intravenous, Continuous, Verlon Au, NP, Last Rate: 500 mL/hr at 10/31/17 1400 .  heparin lock flush 100 unit/mL, 500 Units, Intracatheter, Once PRN, Corcoran, Melissa C, MD .  sodium chloride flush (NS) 0.9 % injection 10 mL, 10 mL, Intracatheter, PRN, Lequita Asal, MD  Physical exam:  Vitals:   11/28/17 1611  BP: 126/83  Pulse: (!) 101  Resp: (!) 22  Temp: (!) 96.7 F (35.9 C)  TempSrc: Tympanic   Physical Exam  Constitutional: He is oriented to person, place, and time.  Seen while receiving IV fluids.  Appears in significant pain.  Accompanied by wife.  HENT:  Head: Normocephalic and atraumatic.  Cardiovascular: Regular rhythm. Tachycardia present.  Pulmonary/Chest: Effort normal and breath sounds normal. No stridor. No respiratory distress. He has no wheezes.  Abdominal: He exhibits distension. There is tenderness.  Musculoskeletal:  Weak; wheelchair + assistance  Neurological: He is alert and oriented to person, place, and time.  Skin: Skin is warm and dry.  Psychiatric: His mood appears anxious.     CMP Latest Ref Rng & Units 11/28/2017  Glucose 65 - 99 mg/dL 132(H)  BUN 6 - 20 mg/dL 21(H)  Creatinine 0.61 - 1.24 mg/dL 0.64  Sodium 135 - 145 mmol/L 129(L)  Potassium 3.5 - 5.1 mmol/L 3.8  Chloride 101 - 111 mmol/L 97(L)  CO2 22 - 32 mmol/L 22  Calcium 8.9 - 10.3 mg/dL 8.5(L)  Total Protein 6.5 - 8.1 g/dL -  Total Bilirubin 0.3 - 1.2 mg/dL -  Alkaline Phos 38 - 126 U/L -  AST 15 - 41 U/L -  ALT 17 - 63 U/L -   CBC Latest Ref Rng & Units 11/28/2017  WBC 3.8 - 10.6 K/uL 4.3  Hemoglobin 13.0 - 18.0 g/dL 10.9(L)  Hematocrit 40.0 - 52.0 % 32.0(L)  Platelets 150 - 400 K/uL 955(HH)     Ct Angio Chest Pe W Or Wo Contrast  Result Date: 11/28/2017 CLINICAL DATA:  Chest pain. Abdominal pain and distention. History of prior lung cancer and bladder  cancer. EXAM: CT ANGIOGRAPHY CHEST CT ABDOMEN AND PELVIS WITH CONTRAST TECHNIQUE: Multidetector CT imaging of the chest was performed using the standard protocol during bolus administration of intravenous contrast. Multiplanar CT image reconstructions and MIPs were obtained to evaluate the vascular anatomy. Multidetector CT imaging of the abdomen and pelvis was performed using the standard protocol during  bolus administration of intravenous contrast. CONTRAST:  75mL ISOVUE-370 IOPAMIDOL (ISOVUE-370) INJECTION 76% COMPARISON:  PET-CT 09/09/2017 FINDINGS: CTA CHEST FINDINGS Cardiovascular: The heart is normal in size. No pericardial effusion. The aorta is normal in caliber. Stable moderate atherosclerotic calcifications but no aneurysm or dissection. Stable coronary artery calcifications. The pulmonary arterial tree is well opacified. No filling defects to suggest pulmonary embolism. Mediastinum/Nodes: Small scattered mediastinal and hilar lymph nodes but no mass or overt adenopathy. The esophagus is grossly normal. Lungs/Pleura: Stable surgical changes from a right upper lobe lobectomy. Stable emphysematous changes. No worrisome pulmonary lesions. No acute pulmonary findings. Musculoskeletal: Right-sided Port-A-Cath in good position. Permanent left-sided pacemaker noted. Diffuse sclerotic osseous metastatic disease, progressive when compared to prior PET-CT. Review of the MIP images confirms the above findings. CT ABDOMEN and PELVIS FINDINGS Hepatobiliary: No focal hepatic lesions or intrahepatic biliary dilatation. The gallbladder is normal. No common bile duct dilatation. Pancreas: No mass, inflammation or ductal dilatation. Moderate atrophy. Calcifications in the uncinate process region likely related to prior inflammatory disease. Spleen: Normal size.  No focal lesions. Adrenals/Urinary Tract: The adrenal glands are normal. No worrisome renal lesions. The delayed images do not demonstrate any significant  collecting system abnormalities. The bladder demonstrates moderate-sized area of right-sided asymmetric bladder wall thickening with enhancement consistent with residual or recurrent bladder cancer. There is also median lobe hypertrophy of the prostate gland impressing on the base the bladder. Stomach/Bowel: The stomach, duodenum, small bowel and colon are grossly normal. No acute inflammatory changes, mass lesions or obstructive findings. Large amount of stool throughout the colon and down into the rectum suggesting fecal impaction and constipation. Vascular/Lymphatic: Surgical changes from aortoiliac bypass grafting. Dense atherosclerotic calcifications at the major branch vessel ostia. No aneurysm or dissection. The graft limbs are patent. No mesenteric or retroperitoneal mass or adenopathy. Reproductive: Prostate gland and seminal vesicles are unremarkable except for median lobe hypertrophy. Other: No pelvic mass or adenopathy. No free pelvic fluid collections. No inguinal mass or adenopathy. Musculoskeletal: Diffuse sclerotic osseous metastatic disease, progressive when compared to prior study. Review of the MIP images confirms the above findings. IMPRESSION: 1. No CT findings for pulmonary embolism. 2. Surgical changes from a right upper lobe lobectomy but no findings for recurrent tumor or pulmonary metastatic disease. 3. Residual or recurrent bladder cancer at the right bladder base. 4. Progressive sclerotic osseous metastatic disease. 5. Surgical changes related to aorto iliac bypass grafting without complicating features. 6. Large amount of stool throughout the colon and down into the rectum suggesting constipation and fecal impaction. Electronically Signed   By: Marijo Sanes M.D.   On: 11/28/2017 17:33   Ct Abdomen Pelvis W Contrast  Result Date: 11/28/2017 CLINICAL DATA:  Chest pain. Abdominal pain and distention. History of prior lung cancer and bladder cancer. EXAM: CT ANGIOGRAPHY CHEST CT ABDOMEN  AND PELVIS WITH CONTRAST TECHNIQUE: Multidetector CT imaging of the chest was performed using the standard protocol during bolus administration of intravenous contrast. Multiplanar CT image reconstructions and MIPs were obtained to evaluate the vascular anatomy. Multidetector CT imaging of the abdomen and pelvis was performed using the standard protocol during bolus administration of intravenous contrast. CONTRAST:  53mL ISOVUE-370 IOPAMIDOL (ISOVUE-370) INJECTION 76% COMPARISON:  PET-CT 09/09/2017 FINDINGS: CTA CHEST FINDINGS Cardiovascular: The heart is normal in size. No pericardial effusion. The aorta is normal in caliber. Stable moderate atherosclerotic calcifications but no aneurysm or dissection. Stable coronary artery calcifications. The pulmonary arterial tree is well opacified. No filling defects to suggest pulmonary  embolism. Mediastinum/Nodes: Small scattered mediastinal and hilar lymph nodes but no mass or overt adenopathy. The esophagus is grossly normal. Lungs/Pleura: Stable surgical changes from a right upper lobe lobectomy. Stable emphysematous changes. No worrisome pulmonary lesions. No acute pulmonary findings. Musculoskeletal: Right-sided Port-A-Cath in good position. Permanent left-sided pacemaker noted. Diffuse sclerotic osseous metastatic disease, progressive when compared to prior PET-CT. Review of the MIP images confirms the above findings. CT ABDOMEN and PELVIS FINDINGS Hepatobiliary: No focal hepatic lesions or intrahepatic biliary dilatation. The gallbladder is normal. No common bile duct dilatation. Pancreas: No mass, inflammation or ductal dilatation. Moderate atrophy. Calcifications in the uncinate process region likely related to prior inflammatory disease. Spleen: Normal size.  No focal lesions. Adrenals/Urinary Tract: The adrenal glands are normal. No worrisome renal lesions. The delayed images do not demonstrate any significant collecting system abnormalities. The bladder  demonstrates moderate-sized area of right-sided asymmetric bladder wall thickening with enhancement consistent with residual or recurrent bladder cancer. There is also median lobe hypertrophy of the prostate gland impressing on the base the bladder. Stomach/Bowel: The stomach, duodenum, small bowel and colon are grossly normal. No acute inflammatory changes, mass lesions or obstructive findings. Large amount of stool throughout the colon and down into the rectum suggesting fecal impaction and constipation. Vascular/Lymphatic: Surgical changes from aortoiliac bypass grafting. Dense atherosclerotic calcifications at the major branch vessel ostia. No aneurysm or dissection. The graft limbs are patent. No mesenteric or retroperitoneal mass or adenopathy. Reproductive: Prostate gland and seminal vesicles are unremarkable except for median lobe hypertrophy. Other: No pelvic mass or adenopathy. No free pelvic fluid collections. No inguinal mass or adenopathy. Musculoskeletal: Diffuse sclerotic osseous metastatic disease, progressive when compared to prior study. Review of the MIP images confirms the above findings. IMPRESSION: 1. No CT findings for pulmonary embolism. 2. Surgical changes from a right upper lobe lobectomy but no findings for recurrent tumor or pulmonary metastatic disease. 3. Residual or recurrent bladder cancer at the right bladder base. 4. Progressive sclerotic osseous metastatic disease. 5. Surgical changes related to aorto iliac bypass grafting without complicating features. 6. Large amount of stool throughout the colon and down into the rectum suggesting constipation and fecal impaction. Electronically Signed   By: Marijo Sanes M.D.   On: 11/28/2017 17:33     Assessment and plan- Patient is a 72 y.o. adult with history of metastatic bladder cancer who presents to symptom management for concerns of constipation and urinary retention.  1.  Metastatic bladder cancer- s/p cycle 2 carbo and  gemcitabine. Xgeva for bone mets on 10/27/17. Follow up with Dr. Mike Gip as previously scheduled.    2. Thrombocytosis/positive d-dimer - plt 955. Per med-onc, d-dimer performed. D-dimer 7,218. Tachycardia. Will get stat CTA to r/o PE.   3. Constipation- likely r/t opioids, chronic dehydration/poor oral intake, and poor mobility. Concern for blockage. Will get CT a/p and transfer to ED for further evaluation and management. Would recommend discharge with senna 1-2 tabs 1-2 times a day with miralax. If symptoms unrelieved, would consider Linzess daily.    4. Urinary retention- patient voided in clinic w/o difficulty. Recommend bladder scan in ED.   Visit Diagnosis 1. Urinary retention     Patient expressed understanding and was in agreement with this plan. He also understands that He can call clinic at any time with any questions, concerns, or complaints.   A total of (30) minutes of face-to-face time was spent with this patient with greater than 50% of that time in counseling and care-coordination.  Beckey Rutter, DNP, AGNP-C [place] [facility] [phone] (office) 11/28/17 3:32 PM

## 2017-11-28 NOTE — ED Notes (Addendum)
Pt started urinating during bladder scan.  269 ml after.  Came with port still accessed.

## 2017-11-28 NOTE — Telephone Encounter (Signed)
I have added him to my schedule on 1/31 to see during his infusion.

## 2017-11-29 ENCOUNTER — Telehealth: Payer: Self-pay | Admitting: *Deleted

## 2017-11-29 ENCOUNTER — Telehealth: Payer: Self-pay | Admitting: Hematology and Oncology

## 2017-11-29 LAB — URINALYSIS, COMPLETE (UACMP) WITH MICROSCOPIC
BILIRUBIN URINE: NEGATIVE
Bacteria, UA: NONE SEEN
Glucose, UA: NEGATIVE mg/dL
HGB URINE DIPSTICK: NEGATIVE
Ketones, ur: 5 mg/dL — AB
LEUKOCYTES UA: NEGATIVE
Nitrite: NEGATIVE
Protein, ur: NEGATIVE mg/dL
SPECIFIC GRAVITY, URINE: 1.025 (ref 1.005–1.030)
SQUAMOUS EPITHELIAL / LPF: NONE SEEN
pH: 6 (ref 5.0–8.0)

## 2017-11-29 MED ORDER — SODIUM CHLORIDE 0.9% FLUSH
10.0000 mL | INTRAVENOUS | Status: DC | PRN
  Administered 2017-11-28: 10 mL via INTRAVENOUS
  Filled 2017-11-29: qty 10

## 2017-11-29 MED ORDER — POLYETHYLENE GLYCOL 3350 17 G PO PACK: 17.0000 g | PACK | Freq: Every day | ORAL | 0 refills | Status: DC

## 2017-11-29 MED ORDER — MAGNESIUM CITRATE PO SOLN: 1.0000 | Freq: Once | ORAL | 1 refills | Status: AC

## 2017-11-29 NOTE — ED Notes (Signed)
Pt did have some success with BM. Pt had 3 formed stools in the commode. Pt states he feels much better now. MD notified.

## 2017-11-29 NOTE — ED Notes (Signed)
Pt passed some stool while sitting in bed. Loose and light brown in color. Pt put up to the bedside commode

## 2017-11-29 NOTE — Telephone Encounter (Signed)
  Please call patient and/or patient's wife and find out how he is doing.  M

## 2017-11-29 NOTE — Discharge Instructions (Signed)
Please seek medical attention for any high fevers, chest pain, shortness of breath, change in behavior, persistent vomiting, bloody stool or any other new or concerning symptoms.  

## 2017-11-29 NOTE — Telephone Encounter (Signed)
Called patient to inquire how he is doing.  Morene Rankins, patient's significant other stated to me that " he went to the ED and it was a horrible experience.  Patient was impacted and had to be disimpacted.  She stated they were there from 5 PM to 1 AM".  She said, "we will never do that again".  She further stated" he soiled his clothes and sat there without clothes".  She ended up calling his sister who is a retired Marine scientist to come to the ED.  She states he is doing much better today and is eating.

## 2017-12-01 ENCOUNTER — Inpatient Hospital Stay: Payer: Medicare Other

## 2017-12-01 ENCOUNTER — Other Ambulatory Visit: Payer: Self-pay | Admitting: Hematology and Oncology

## 2017-12-01 ENCOUNTER — Encounter: Payer: Self-pay | Admitting: Hematology and Oncology

## 2017-12-01 ENCOUNTER — Telehealth: Payer: Self-pay | Admitting: *Deleted

## 2017-12-01 VITALS — BP 132/79 | HR 74 | Temp 96.9°F | Resp 17

## 2017-12-01 DIAGNOSIS — Z5111 Encounter for antineoplastic chemotherapy: Secondary | ICD-10-CM | POA: Diagnosis not present

## 2017-12-01 DIAGNOSIS — D72819 Decreased white blood cell count, unspecified: Secondary | ICD-10-CM | POA: Insufficient documentation

## 2017-12-01 DIAGNOSIS — C7951 Secondary malignant neoplasm of bone: Secondary | ICD-10-CM

## 2017-12-01 DIAGNOSIS — C672 Malignant neoplasm of lateral wall of bladder: Secondary | ICD-10-CM

## 2017-12-01 LAB — CBC WITH DIFFERENTIAL/PLATELET
Basophils Absolute: 0 10*3/uL (ref 0–0.1)
Basophils Relative: 2 %
Eosinophils Absolute: 0 10*3/uL (ref 0–0.7)
Eosinophils Relative: 1 %
HCT: 29.5 % — ABNORMAL LOW (ref 40.0–52.0)
Hemoglobin: 10.2 g/dL — ABNORMAL LOW (ref 13.0–18.0)
Lymphocytes Relative: 40 %
Lymphs Abs: 0.7 10*3/uL — ABNORMAL LOW (ref 1.0–3.6)
MCH: 28.8 pg (ref 26.0–34.0)
MCHC: 34.6 g/dL (ref 32.0–36.0)
MCV: 83.3 fL (ref 80.0–100.0)
Monocytes Absolute: 0.1 10*3/uL — ABNORMAL LOW (ref 0.2–1.0)
Monocytes Relative: 5 %
Neutro Abs: 1 10*3/uL — ABNORMAL LOW (ref 1.4–6.5)
Neutrophils Relative %: 52 %
Platelets: 523 10*3/uL — ABNORMAL HIGH (ref 150–440)
RBC: 3.54 MIL/uL — ABNORMAL LOW (ref 4.40–5.90)
RDW: 16.6 % — ABNORMAL HIGH (ref 11.5–14.5)
WBC: 1.8 10*3/uL — ABNORMAL LOW (ref 3.8–10.6)

## 2017-12-01 LAB — COMPREHENSIVE METABOLIC PANEL
ALT: 44 U/L (ref 17–63)
AST: 39 U/L (ref 15–41)
Albumin: 3.2 g/dL — ABNORMAL LOW (ref 3.5–5.0)
Alkaline Phosphatase: 92 U/L (ref 38–126)
Anion gap: 8 (ref 5–15)
BUN: 14 mg/dL (ref 6–20)
CO2: 23 mmol/L (ref 22–32)
Calcium: 8.3 mg/dL — ABNORMAL LOW (ref 8.9–10.3)
Chloride: 98 mmol/L — ABNORMAL LOW (ref 101–111)
Creatinine, Ser: 0.55 mg/dL — ABNORMAL LOW (ref 0.61–1.24)
GFR calc Af Amer: >60 mL/min (ref >60)
GFR calc non Af Amer: >60 mL/min (ref >60)
Glucose, Bld: 127 mg/dL — ABNORMAL HIGH (ref 65–99)
Potassium: 3.4 mmol/L — ABNORMAL LOW (ref 3.5–5.1)
Sodium: 129 mmol/L — ABNORMAL LOW (ref 135–145)
Total Bilirubin: 0.4 mg/dL (ref 0.3–1.2)
Total Protein: 6.4 g/dL — ABNORMAL LOW (ref 6.5–8.1)

## 2017-12-01 LAB — MAGNESIUM: Magnesium: 1.8 mg/dL (ref 1.7–2.4)

## 2017-12-01 MED ORDER — SODIUM CHLORIDE 0.9 % IV SOLN
Freq: Once | INTRAVENOUS | Status: AC
  Administered 2017-12-01: 09:00:00 via INTRAVENOUS
  Filled 2017-12-01: qty 1000

## 2017-12-01 MED ORDER — HEPARIN SOD (PORK) LOCK FLUSH 100 UNIT/ML IV SOLN
500.0000 [IU] | Freq: Once | INTRAVENOUS | Status: AC
  Administered 2017-12-01: 500 [IU] via INTRAVENOUS

## 2017-12-01 MED ORDER — SODIUM CHLORIDE 0.9% FLUSH
10.0000 mL | INTRAVENOUS | Status: DC | PRN
  Administered 2017-12-01: 10 mL via INTRAVENOUS
  Filled 2017-12-01: qty 10

## 2017-12-01 NOTE — Telephone Encounter (Signed)
-----   Message from Lequita Asal, MD sent at 12/01/2017  9:08 AM EST ----- Regarding: Please talk to patient  Neutropenic precautions.   Preauth GCSF.  M  ----- Message ----- From: Interface, Lab In [place] Sent: 12/01/2017   8:48 AM To: Lequita Asal, MD

## 2017-12-01 NOTE — Progress Notes (Signed)
Nutrition Assessment   Reason for Assessment:   Patient referred to RD from RN due to constipation  ASSESSMENT:  72 year old male with metastatic bladder cancer.  Patient receiving gemcitabine and carboplatin.  Past medical history of lung cancer 2016, PVD, HTN. Patient on monthly xgeva. Noted recent issues with constipation.  Met with patient and wife during infusion of IV fluids today.  Treatment held due to labs.  Patient reports poor appetite since October of 2018 following bladder surgery.  Reports drinks carnation instant breakfast daily usually in in the morning.  Reports that he grazes during the day.  Wife reports eats 1/2 of egg salad sandwich but usually not all of it.  Reports eating less at least 50% less of normal intake.  Patient just started drinking premier protein, does not like ensure/boost shakes.    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   MALNUTRITION DIAGNOSIS: Patient meets criteria for severe malnutrition in context of acute illness likely chronic illness as evidenced by 15% weight loss in 3 months and eating < 50% of energy intake for > 5 days.    INTERVENTION:   Discussed strategies to increase calories and protein. Fact sheet provided. Provided high calorie, high protein recipes from AND.   Encouraged bowel regimen to promote regular bowel movement and  adequate hydration. Discussed oral nutrition supplements. Provided samples of unjury protein powder along with recipes. Encouraged appetite stimulant as wife reports felt that it help him.      MONITORING, EVALUATION, GOAL: weight trends, intake   NEXT VISIT: Feb 14 during infusion  Gal Feldhaus B. Zenia Resides, [place], [place] Registered Dietitian [phone] (pager)

## 2017-12-01 NOTE — Progress Notes (Signed)
Per Over[place] Mam NP, no Gemzar or Xgeva today. Pt stable at discharge.

## 2017-12-01 NOTE — Telephone Encounter (Signed)
Called patient's significant other and LVM that patient should be on neutropenic precautions.  Explained that patient's immune system is low and as a result should be sure not to eat fresh fruits and vegetables.  Everything should be washed and cooked thoroughly.  Patient should wear a mask when going out. Should enforce strict handwashing.  No shellfish, no fresh flowers and don't be around anyone who has had a vaccination within 48 hours or is sick.  Advised them to call back if questions.

## 2017-12-02 ENCOUNTER — Encounter: Payer: Self-pay | Admitting: Nurse Practitioner

## 2017-12-05 ENCOUNTER — Inpatient Hospital Stay: Payer: Medicare Other | Attending: Hematology and Oncology

## 2017-12-05 DIAGNOSIS — Z79899 Other long term (current) drug therapy: Secondary | ICD-10-CM | POA: Insufficient documentation

## 2017-12-05 DIAGNOSIS — I471 Supraventricular tachycardia: Secondary | ICD-10-CM | POA: Insufficient documentation

## 2017-12-05 DIAGNOSIS — R001 Bradycardia, unspecified: Secondary | ICD-10-CM | POA: Insufficient documentation

## 2017-12-05 DIAGNOSIS — R918 Other nonspecific abnormal finding of lung field: Secondary | ICD-10-CM | POA: Insufficient documentation

## 2017-12-05 DIAGNOSIS — I10 Essential (primary) hypertension: Secondary | ICD-10-CM | POA: Insufficient documentation

## 2017-12-05 DIAGNOSIS — D649 Anemia, unspecified: Secondary | ICD-10-CM | POA: Insufficient documentation

## 2017-12-05 DIAGNOSIS — C7951 Secondary malignant neoplasm of bone: Secondary | ICD-10-CM | POA: Insufficient documentation

## 2017-12-05 DIAGNOSIS — Z809 Family history of malignant neoplasm, unspecified: Secondary | ICD-10-CM | POA: Insufficient documentation

## 2017-12-05 DIAGNOSIS — Z7982 Long term (current) use of aspirin: Secondary | ICD-10-CM | POA: Insufficient documentation

## 2017-12-05 DIAGNOSIS — I951 Orthostatic hypotension: Secondary | ICD-10-CM | POA: Insufficient documentation

## 2017-12-05 DIAGNOSIS — Z5111 Encounter for antineoplastic chemotherapy: Secondary | ICD-10-CM | POA: Insufficient documentation

## 2017-12-05 DIAGNOSIS — Z85118 Personal history of other malignant neoplasm of bronchus and lung: Secondary | ICD-10-CM | POA: Insufficient documentation

## 2017-12-05 DIAGNOSIS — F17210 Nicotine dependence, cigarettes, uncomplicated: Secondary | ICD-10-CM | POA: Insufficient documentation

## 2017-12-05 DIAGNOSIS — C672 Malignant neoplasm of lateral wall of bladder: Secondary | ICD-10-CM | POA: Insufficient documentation

## 2017-12-05 DIAGNOSIS — R590 Localized enlarged lymph nodes: Secondary | ICD-10-CM | POA: Insufficient documentation

## 2017-12-05 DIAGNOSIS — I739 Peripheral vascular disease, unspecified: Secondary | ICD-10-CM | POA: Insufficient documentation

## 2017-12-05 DIAGNOSIS — R42 Dizziness and giddiness: Secondary | ICD-10-CM | POA: Insufficient documentation

## 2017-12-07 ENCOUNTER — Ambulatory Visit (INDEPENDENT_AMBULATORY_CARE_PROVIDER_SITE_OTHER): Payer: Medicare Other | Admitting: Family Medicine

## 2017-12-07 ENCOUNTER — Encounter: Payer: Self-pay | Admitting: Family Medicine

## 2017-12-07 VITALS — BP 106/52 | HR 113 | Temp 97.8°F | Wt 124.8 lb

## 2017-12-07 DIAGNOSIS — F419 Anxiety disorder, unspecified: Secondary | ICD-10-CM

## 2017-12-07 DIAGNOSIS — R208 Other disturbances of skin sensation: Secondary | ICD-10-CM

## 2017-12-07 DIAGNOSIS — K5903 Drug induced constipation: Secondary | ICD-10-CM | POA: Diagnosis not present

## 2017-12-07 MED ORDER — CLOPIDOGREL BISULFATE 75 MG PO TABS: 75.0000 mg | ORAL_TABLET | Freq: Every day | ORAL | 1 refills | Status: AC

## 2017-12-07 MED ORDER — GABAPENTIN 100 MG PO CAPS: 100.0000 mg | ORAL_CAPSULE | Freq: Every day | ORAL | 3 refills | Status: DC

## 2017-12-07 MED ORDER — ALPRAZOLAM 0.25 MG PO TABS: 0.2500 mg | ORAL_TABLET | Freq: Two times a day (BID) | ORAL | 0 refills | Status: DC | PRN

## 2017-12-07 NOTE — Progress Notes (Signed)
Subjective:    Patient ID: Patrick Hooper, male    DOB: 02/13/1946, 72 y.o.   MRN: [id number]  HPI This is a 72 yo male who is accompanied by his significant other who presents for follow up of recent ER visit for constipation. He is doing better with daily Miralax and with cutting back on opioid pain medication. He has stage 4 bladder cancer and is currently undergoing chemotherapy. He has had some palliative radiation to his spine. Home health is coming out.  He reports that his pain is manageable, even with decreasing his pain meds but that he has terrible burning in his feet. He has known PVD and smokes 1 ppd.   He continues to have postural dizziness, was improved temporarily after getting fluids. PO intake slightly improved, he continues to have little appetite. Is on Remeron.   Has noticed increasing anxiety, has taken small amounts xanax in past with improvement.   Past Medical History:  Diagnosis Date  . Bladder cancer ([place])   . Hypertension   . Lung cancer ([place]) 2016  . PVD (peripheral vascular disease) ([place])    Past Surgical History:  Procedure Laterality Date  . BLADDER SURGERY  08/12/2017  . BYPASS GRAFT FEMORAL/POPLITEAL W/VEIN  2009  . COLONOSCOPY  2013  . CYSTOSCOPY  08/12/2017  . IR ABDOMINAL AORTOBIFEMORAL CATHETER SERIALOGRAM  2009  . LOBECTOMY Right 09/2015  . PACEMAKER INSERTION  2003  . PORTA CATH INSERTION N/A 11/21/2017   Procedure: PORTA CATH INSERTION;  Surgeon: Algernon Huxley, MD;  Location: [facility];  Service: Cardiovascular;  Laterality: N/A;   Family History  Problem Relation Age of Onset  . Cancer Mother   . Cancer Sister   . Cancer Maternal Grandmother    Social History   Tobacco Use  . Smoking status: Current Every Day Smoker    Packs/day: 1.00    Years: 55.00    Pack years: 55.00    Types: Cigarettes  . Smokeless tobacco: Never Used  Substance Use Topics  . Alcohol use: Yes    Comment: Occasional beer  . Drug use: No        Review of Systems Per HPI    Objective:   Physical Exam  Constitutional: He is oriented to person, place, and time. He appears well-developed and well-nourished.  HENT:  Head: Normocephalic and atraumatic.  Eyes: Conjunctivae are normal.  Cardiovascular: Regular rhythm, normal heart sounds and intact distal pulses. Tachycardia present.  Pulmonary/Chest: Effort normal and breath sounds normal.  Musculoskeletal: He exhibits no edema.  Neurological: He is alert and oriented to person, place, and time.  Skin: Skin is warm and dry.  Mild chronic hyperpigmentation of LE.   Psychiatric: He has a normal mood and affect. His behavior is normal. Judgment and thought content normal.  Vitals reviewed.     BP (!) 106/52   Pulse (!) 113   Temp 97.8 F (36.6 C) (Oral)   Wt 124 lb 12 oz (56.6 kg)   SpO2 99%   BMI 17.40 kg/m  Wt Readings from Last 3 Encounters:  12/07/17 124 lb 12 oz (56.6 kg)  11/28/17 120 lb (54.4 kg)  11/24/17 124 lb 3.7 oz (56.4 kg)   BP Readings from Last 3 Encounters:  12/07/17 (!) 106/52  12/01/17 132/79  11/29/17 123/77       Assessment & Plan:  1. Burning sensation of feet - will try low dose gabapentin for neuropathic pain, discussed potential side effects -  gabapentin (NEURONTIN) 100 MG capsule; Take 1 capsule (100 mg total) by mouth at bedtime.  Dispense: 30 capsule; Refill: 3  2. Anxiety - patient with metastatic bladder cancer, will give him a small amount low dose alprazolam, discussed potential side effects including sedation, dizziness - ALPRAZolam (XANAX) 0.25 MG tablet; Take 1 tablet (0.25 mg total) by mouth 2 (two) times daily as needed for anxiety.  Dispense: 30 tablet; Refill: 0  3. Drug-induced constipation - improved with reduction of pain medication, encouraged regular use of Miralax, good fluid intake.   Clarene Reamer, FNP-BC  [facility]  12/12/2017 1:49 PM

## 2017-12-07 NOTE — Patient Instructions (Signed)
Good to see you today  I have sent in a nighttime medicine for your feet  I will look into a medication for your blood pressure  Please follow up in 2 months

## 2017-12-08 ENCOUNTER — Inpatient Hospital Stay: Payer: Medicare Other

## 2017-12-08 VITALS — BP 133/86 | HR 99 | Temp 96.8°F | Resp 20

## 2017-12-08 DIAGNOSIS — I951 Orthostatic hypotension: Secondary | ICD-10-CM | POA: Diagnosis not present

## 2017-12-08 DIAGNOSIS — R918 Other nonspecific abnormal finding of lung field: Secondary | ICD-10-CM | POA: Diagnosis not present

## 2017-12-08 DIAGNOSIS — Z5111 Encounter for antineoplastic chemotherapy: Secondary | ICD-10-CM | POA: Diagnosis not present

## 2017-12-08 DIAGNOSIS — I10 Essential (primary) hypertension: Secondary | ICD-10-CM | POA: Diagnosis not present

## 2017-12-08 DIAGNOSIS — C672 Malignant neoplasm of lateral wall of bladder: Secondary | ICD-10-CM

## 2017-12-08 DIAGNOSIS — Z79899 Other long term (current) drug therapy: Secondary | ICD-10-CM | POA: Diagnosis not present

## 2017-12-08 DIAGNOSIS — R42 Dizziness and giddiness: Secondary | ICD-10-CM | POA: Diagnosis not present

## 2017-12-08 DIAGNOSIS — I471 Supraventricular tachycardia: Secondary | ICD-10-CM | POA: Diagnosis not present

## 2017-12-08 DIAGNOSIS — R001 Bradycardia, unspecified: Secondary | ICD-10-CM | POA: Diagnosis not present

## 2017-12-08 DIAGNOSIS — Z809 Family history of malignant neoplasm, unspecified: Secondary | ICD-10-CM | POA: Diagnosis not present

## 2017-12-08 DIAGNOSIS — C7951 Secondary malignant neoplasm of bone: Secondary | ICD-10-CM | POA: Diagnosis not present

## 2017-12-08 DIAGNOSIS — R590 Localized enlarged lymph nodes: Secondary | ICD-10-CM | POA: Diagnosis not present

## 2017-12-08 DIAGNOSIS — Z7982 Long term (current) use of aspirin: Secondary | ICD-10-CM | POA: Diagnosis not present

## 2017-12-08 DIAGNOSIS — Z85118 Personal history of other malignant neoplasm of bronchus and lung: Secondary | ICD-10-CM | POA: Diagnosis not present

## 2017-12-08 DIAGNOSIS — F17210 Nicotine dependence, cigarettes, uncomplicated: Secondary | ICD-10-CM | POA: Diagnosis not present

## 2017-12-08 DIAGNOSIS — D649 Anemia, unspecified: Secondary | ICD-10-CM | POA: Diagnosis not present

## 2017-12-08 DIAGNOSIS — I739 Peripheral vascular disease, unspecified: Secondary | ICD-10-CM | POA: Diagnosis not present

## 2017-12-08 DIAGNOSIS — C801 Malignant (primary) neoplasm, unspecified: Secondary | ICD-10-CM

## 2017-12-08 LAB — CBC WITH DIFFERENTIAL/PLATELET
Basophils Absolute: 0 10*3/uL (ref 0–0.1)
Basophils Relative: 1 %
Eosinophils Absolute: 0 10*3/uL (ref 0–0.7)
Eosinophils Relative: 1 %
HCT: 31.4 % — ABNORMAL LOW (ref 40.0–52.0)
Hemoglobin: 10.7 g/dL — ABNORMAL LOW (ref 13.0–18.0)
Lymphocytes Relative: 20 %
Lymphs Abs: 1.1 10*3/uL (ref 1.0–3.6)
MCH: 29.5 pg (ref 26.0–34.0)
MCHC: 34 g/dL (ref 32.0–36.0)
MCV: 86.6 fL (ref 80.0–100.0)
Monocytes Absolute: 0.9 10*3/uL (ref 0.2–1.0)
Monocytes Relative: 17 %
Neutro Abs: 3.3 10*3/uL (ref 1.4–6.5)
Neutrophils Relative %: 61 %
Platelets: 180 10*3/uL (ref 150–440)
RBC: 3.63 MIL/uL — ABNORMAL LOW (ref 4.40–5.90)
RDW: 17.6 % — ABNORMAL HIGH (ref 11.5–14.5)
WBC: 5.4 10*3/uL (ref 3.8–10.6)

## 2017-12-08 LAB — BASIC METABOLIC PANEL
Anion gap: 7 (ref 5–15)
BUN: 14 mg/dL (ref 6–20)
CO2: 23 mmol/L (ref 22–32)
Calcium: 8.8 mg/dL — ABNORMAL LOW (ref 8.9–10.3)
Chloride: 102 mmol/L (ref 101–111)
Creatinine, Ser: 0.5 mg/dL — ABNORMAL LOW (ref 0.61–1.24)
GFR calc Af Amer: >60 mL/min (ref >60)
GFR calc non Af Amer: >60 mL/min (ref >60)
Glucose, Bld: 104 mg/dL — ABNORMAL HIGH (ref 65–99)
Potassium: 3.9 mmol/L (ref 3.5–5.1)
Sodium: 132 mmol/L — ABNORMAL LOW (ref 135–145)

## 2017-12-08 MED ORDER — HEPARIN SOD (PORK) LOCK FLUSH 100 UNIT/ML IV SOLN
500.0000 [IU] | Freq: Once | INTRAVENOUS | Status: AC
  Administered 2017-12-08: 500 [IU] via INTRAVENOUS
  Filled 2017-12-08: qty 5

## 2017-12-08 MED ORDER — SODIUM CHLORIDE 0.9% FLUSH
10.0000 mL | INTRAVENOUS | Status: DC | PRN
  Filled 2017-12-08: qty 10

## 2017-12-08 NOTE — Progress Notes (Signed)
Patient reports feeling much better today, walking better, VS stable.  No treatment today per Honor Loh, NP

## 2017-12-12 ENCOUNTER — Encounter: Payer: Self-pay | Admitting: Family Medicine

## 2017-12-12 ENCOUNTER — Inpatient Hospital Stay: Payer: Medicare Other

## 2017-12-12 DIAGNOSIS — Z5111 Encounter for antineoplastic chemotherapy: Secondary | ICD-10-CM | POA: Diagnosis not present

## 2017-12-12 DIAGNOSIS — C7951 Secondary malignant neoplasm of bone: Secondary | ICD-10-CM

## 2017-12-12 MED ORDER — CYANOCOBALAMIN 1000 MCG/ML IJ SOLN
1000.0000 ug | Freq: Once | INTRAMUSCULAR | Status: AC
  Administered 2017-12-12: 1000 ug via INTRAMUSCULAR

## 2017-12-12 NOTE — Progress Notes (Deleted)
Cardiology Office Note  Date:  12/12/2017   ID:  Patrick, Hooper 19-Jul-1946, MRN [id number]  PCP:  Elby Beck, FNP   No chief complaint on file.   HPI:  Patrick Hooper is a 72 year old gentleman with past medical history of Bladder cancer , metastatic Lung cancer  Peripheral vascular disease Smoker 55 years Anxiety Chronic pain Who presents by referral from Dr. Mike Gip for symptoms of recurrent hypotension   PMH:   has a past medical history of Bladder cancer ([place]), Hypertension, Lung cancer ([place]) (2016), and PVD (peripheral vascular disease) ([place]).  PSH:    Past Surgical History:  Procedure Laterality Date  . BLADDER SURGERY  08/12/2017  . BYPASS GRAFT FEMORAL/POPLITEAL W/VEIN  2009  . COLONOSCOPY  2013  . CYSTOSCOPY  08/12/2017  . IR ABDOMINAL AORTOBIFEMORAL CATHETER SERIALOGRAM  2009  . LOBECTOMY Right 09/2015  . PACEMAKER INSERTION  2003  . PORTA CATH INSERTION N/A 11/21/2017   Procedure: PORTA CATH INSERTION;  Surgeon: Algernon Huxley, MD;  Location: [facility];  Service: Cardiovascular;  Laterality: N/A;    Current Outpatient Medications  Medication Sig Dispense Refill  . acetaminophen (TYLENOL) 500 MG tablet Take 500 mg by mouth every 6 (six) hours as needed for mild pain.     Marland Kitchen ALPRAZolam (XANAX) 0.25 MG tablet Take 1 tablet (0.25 mg total) by mouth 2 (two) times daily as needed for anxiety. 30 tablet 0  . aspirin EC 81 MG tablet Take 81 mg by mouth at bedtime.    . cholecalciferol (VITAMIN D) 400 units TABS tablet Take 800 Units by mouth daily.    . clopidogrel (PLAVIX) 75 MG tablet Take 1 tablet (75 mg total) by mouth at bedtime. 90 tablet 1  . dexamethasone (DECADRON) 4 MG tablet Take 2 tablets (8 mg total) by mouth daily. Start the day after carboplatin chemotherapy for 2 days. 30 tablet 1  . docusate sodium (COLACE) 100 MG capsule Take 100 mg 2 (two) times daily by mouth.    . gabapentin (NEURONTIN) 100 MG capsule Take 1 capsule (100 mg  total) by mouth at bedtime. 30 capsule 3  . lactulose (CHRONULAC) 10 GM/15ML solution Take 45 mLs (30 g total) by mouth 2 (two) times daily as needed for mild constipation or severe constipation. 1892 mL 0  . lidocaine-prilocaine (EMLA) cream Apply 1 application topically PRN 1 hour prior to procedure. Cover with press and seal until treatment. 30 g 0  . LORazepam (ATIVAN) 0.5 MG tablet Take 1 tablet (0.5 mg total) by mouth every 6 (six) hours as needed (Nausea or vomiting). 30 tablet 0  . megestrol (MEGACE ORAL) 40 MG/ML suspension Take 5 mLs (200 mg total) by mouth daily. 240 mL 0  . mirtazapine (REMERON) 15 MG tablet Take 15 mg at bedtime by mouth.    . morphine (MS CONTIN) 15 MG 12 hr tablet Take 1 tablet (15 mg total) by mouth every 12 (twelve) hours. 30 tablet 0  . ondansetron (ZOFRAN) 4 MG tablet Take 1 tablet (4 mg total) by mouth every 6 (six) hours as needed for nausea. 20 tablet 0  . oxycodone (OXY-IR) 5 MG capsule Take 1 capsule (5 mg total) by mouth every 4 (four) hours as needed for pain. 30 capsule 0  . polyethylene glycol (MIRALAX / GLYCOLAX) packet Take 17 g by mouth daily. (Patient taking differently: Take 17 g by mouth daily as needed. ) 30 each 0  . polyethylene glycol (MIRALAX) packet Take  17 g by mouth daily. 14 each 0  . potassium chloride (K-DUR) 10 MEQ tablet Take 1 tablet (10 mEq total) by mouth 2 (two) times daily. 20 tablet 0  . protein supplement shake (PREMIER PROTEIN) LIQD Take 325 mLs (11 oz total) by mouth 2 (two) times daily between meals. 60 Can 0   No current facility-administered medications for this visit.    Facility-Administered Medications Ordered in Other Visits  Medication Dose Route Frequency Provider Last Rate Last Dose  . 0.9 %  sodium chloride infusion   Intravenous Continuous Verlon Au, NP 500 mL/hr at 10/31/17 1400       Allergies:   Cefazolin; Carvedilol; Chlorthalidone; Hydralazine; and Hydrochlorothiazide   Social History:  The patient   reports that he has been smoking cigarettes.  He has a 55.00 pack-year smoking history. he has never used smokeless tobacco. He reports that he drinks alcohol. He reports that he does not use drugs.   Family History:   family history includes Cancer in his maternal grandmother, mother, and sister.    Review of Systems: ROS   PHYSICAL EXAM: VS:  There were no vitals taken for this visit. , BMI There is no height or weight on file to calculate BMI. GEN: Well nourished, well developed, in no acute distress HEENT: normal Neck: no JVD, carotid bruits, or masses Cardiac: RRR; no murmurs, rubs, or gallops,no edema  Respiratory:  clear to auscultation bilaterally, normal work of breathing GI: soft, nontender, nondistended, + BS MS: no deformity or atrophy Skin: warm and dry, no rash Neuro:  Strength and sensation are intact Psych: euthymic mood, full affect    Recent Labs: 12/01/2017: ALT 44; Magnesium 1.8 12/08/2017: BUN 14; Creatinine, Ser 0.50; Hemoglobin 10.7; Platelets 180; Potassium 3.9; Sodium 132    Lipid Panel No results found for: CHOL, HDL, LDLCALC, TRIG    Wt Readings from Last 3 Encounters:  12/07/17 124 lb 12 oz (56.6 kg)  11/28/17 120 lb (54.4 kg)  11/24/17 124 lb 3.7 oz (56.4 kg)       ASSESSMENT AND PLAN:  No diagnosis found.   Disposition:   F/U  6 months  No orders of the defined types were placed in this encounter.    Signed, Esmond Plants, M.D., Ph.D. 12/12/2017  [place], [place]

## 2017-12-13 ENCOUNTER — Ambulatory Visit: Payer: Medicare Other | Admitting: Cardiovascular Disease

## 2017-12-15 ENCOUNTER — Other Ambulatory Visit: Payer: Self-pay

## 2017-12-15 ENCOUNTER — Encounter: Payer: Self-pay | Admitting: Hematology and Oncology

## 2017-12-15 ENCOUNTER — Inpatient Hospital Stay: Payer: Medicare Other

## 2017-12-15 ENCOUNTER — Inpatient Hospital Stay (HOSPITAL_BASED_OUTPATIENT_CLINIC_OR_DEPARTMENT_OTHER): Payer: Medicare Other | Admitting: Hematology and Oncology

## 2017-12-15 ENCOUNTER — Other Ambulatory Visit: Payer: Self-pay | Admitting: Hematology and Oncology

## 2017-12-15 VITALS — BP 105/74 | HR 111 | Temp 97.0°F | Resp 12 | Ht 71.0 in | Wt 123.4 lb

## 2017-12-15 DIAGNOSIS — F17210 Nicotine dependence, cigarettes, uncomplicated: Secondary | ICD-10-CM

## 2017-12-15 DIAGNOSIS — E538 Deficiency of other specified B group vitamins: Secondary | ICD-10-CM

## 2017-12-15 DIAGNOSIS — I471 Supraventricular tachycardia: Secondary | ICD-10-CM

## 2017-12-15 DIAGNOSIS — C672 Malignant neoplasm of lateral wall of bladder: Secondary | ICD-10-CM

## 2017-12-15 DIAGNOSIS — R590 Localized enlarged lymph nodes: Secondary | ICD-10-CM

## 2017-12-15 DIAGNOSIS — Z79899 Other long term (current) drug therapy: Secondary | ICD-10-CM

## 2017-12-15 DIAGNOSIS — R42 Dizziness and giddiness: Secondary | ICD-10-CM

## 2017-12-15 DIAGNOSIS — Z5111 Encounter for antineoplastic chemotherapy: Secondary | ICD-10-CM | POA: Diagnosis not present

## 2017-12-15 DIAGNOSIS — R001 Bradycardia, unspecified: Secondary | ICD-10-CM | POA: Diagnosis not present

## 2017-12-15 DIAGNOSIS — D649 Anemia, unspecified: Secondary | ICD-10-CM

## 2017-12-15 DIAGNOSIS — C7951 Secondary malignant neoplasm of bone: Secondary | ICD-10-CM

## 2017-12-15 DIAGNOSIS — R918 Other nonspecific abnormal finding of lung field: Secondary | ICD-10-CM | POA: Diagnosis not present

## 2017-12-15 DIAGNOSIS — Z809 Family history of malignant neoplasm, unspecified: Secondary | ICD-10-CM

## 2017-12-15 DIAGNOSIS — I10 Essential (primary) hypertension: Secondary | ICD-10-CM

## 2017-12-15 DIAGNOSIS — Z85118 Personal history of other malignant neoplasm of bronchus and lung: Secondary | ICD-10-CM

## 2017-12-15 DIAGNOSIS — Z7189 Other specified counseling: Secondary | ICD-10-CM

## 2017-12-15 DIAGNOSIS — R7989 Other specified abnormal findings of blood chemistry: Secondary | ICD-10-CM

## 2017-12-15 DIAGNOSIS — Z7982 Long term (current) use of aspirin: Secondary | ICD-10-CM

## 2017-12-15 DIAGNOSIS — I739 Peripheral vascular disease, unspecified: Secondary | ICD-10-CM

## 2017-12-15 DIAGNOSIS — I951 Orthostatic hypotension: Secondary | ICD-10-CM | POA: Diagnosis not present

## 2017-12-15 LAB — COMPREHENSIVE METABOLIC PANEL
ALT: 26 U/L (ref 17–63)
AST: 31 U/L (ref 15–41)
Albumin: 3.5 g/dL (ref 3.5–5.0)
Alkaline Phosphatase: 113 U/L (ref 38–126)
Anion gap: 10 (ref 5–15)
BUN: 13 mg/dL (ref 6–20)
CO2: 22 mmol/L (ref 22–32)
Calcium: 8.5 mg/dL — ABNORMAL LOW (ref 8.9–10.3)
Chloride: 98 mmol/L — ABNORMAL LOW (ref 101–111)
Creatinine, Ser: 0.6 mg/dL — ABNORMAL LOW (ref 0.61–1.24)
GFR calc Af Amer: >60 mL/min (ref >60)
GFR calc non Af Amer: >60 mL/min (ref >60)
Glucose, Bld: 116 mg/dL — ABNORMAL HIGH (ref 65–99)
Potassium: 3.4 mmol/L — ABNORMAL LOW (ref 3.5–5.1)
Sodium: 130 mmol/L — ABNORMAL LOW (ref 135–145)
Total Bilirubin: 0.5 mg/dL (ref 0.3–1.2)
Total Protein: 6.9 g/dL (ref 6.5–8.1)

## 2017-12-15 LAB — CBC WITH DIFFERENTIAL/PLATELET
Basophils Absolute: 0.1 10*3/uL (ref 0–0.1)
Basophils Relative: 1 %
Eosinophils Absolute: 0.1 10*3/uL (ref 0–0.7)
Eosinophils Relative: 1 %
HCT: 33.3 % — ABNORMAL LOW (ref 40.0–52.0)
Hemoglobin: 11.6 g/dL — ABNORMAL LOW (ref 13.0–18.0)
Lymphocytes Relative: 22 %
Lymphs Abs: 1.8 10*3/uL (ref 1.0–3.6)
MCH: 30.8 pg (ref 26.0–34.0)
MCHC: 34.8 g/dL (ref 32.0–36.0)
MCV: 88.5 fL (ref 80.0–100.0)
Monocytes Absolute: 1.4 10*3/uL — ABNORMAL HIGH (ref 0.2–1.0)
Monocytes Relative: 17 %
Neutro Abs: 4.9 10*3/uL (ref 1.4–6.5)
Neutrophils Relative %: 59 %
Platelets: 494 10*3/uL — ABNORMAL HIGH (ref 150–440)
RBC: 3.77 MIL/uL — ABNORMAL LOW (ref 4.40–5.90)
RDW: 25.1 % — ABNORMAL HIGH (ref 11.5–14.5)
WBC: 8.2 10*3/uL (ref 3.8–10.6)

## 2017-12-15 LAB — MAGNESIUM: Magnesium: 1.8 mg/dL (ref 1.7–2.4)

## 2017-12-15 MED ORDER — SODIUM CHLORIDE 0.9 % IV SOLN
324.0000 mg | Freq: Once | INTRAVENOUS | Status: AC
  Administered 2017-12-15: 320 mg via INTRAVENOUS
  Filled 2017-12-15: qty 32

## 2017-12-15 MED ORDER — POTASSIUM CHLORIDE CRYS ER 20 MEQ PO TBCR: 20.0000 meq | EXTENDED_RELEASE_TABLET | Freq: Every day | ORAL | 1 refills | Status: DC

## 2017-12-15 MED ORDER — HEPARIN SOD (PORK) LOCK FLUSH 100 UNIT/ML IV SOLN
500.0000 [IU] | Freq: Once | INTRAVENOUS | Status: DC | PRN
Start: 2017-12-15 — End: 2017-12-15

## 2017-12-15 MED ORDER — DEXAMETHASONE SODIUM PHOSPHATE 10 MG/ML IJ SOLN
10.0000 mg | Freq: Once | INTRAMUSCULAR | Status: AC
  Administered 2017-12-15: 10 mg via INTRAVENOUS
  Filled 2017-12-15: qty 1

## 2017-12-15 MED ORDER — PALONOSETRON HCL INJECTION 0.25 MG/5ML
0.2500 mg | Freq: Once | INTRAVENOUS | Status: AC
  Administered 2017-12-15: 0.25 mg via INTRAVENOUS
  Filled 2017-12-15: qty 5

## 2017-12-15 MED ORDER — HEPARIN SOD (PORK) LOCK FLUSH 100 UNIT/ML IV SOLN
500.0000 [IU] | Freq: Once | INTRAVENOUS | Status: AC
  Administered 2017-12-15: 500 [IU] via INTRAVENOUS

## 2017-12-15 MED ORDER — SODIUM CHLORIDE 0.9% FLUSH
10.0000 mL | INTRAVENOUS | Status: DC | PRN
  Administered 2017-12-15: 10 mL via INTRAVENOUS
  Filled 2017-12-15: qty 10

## 2017-12-15 MED ORDER — SODIUM CHLORIDE 0.9 % IV SOLN
1400.0000 mg | Freq: Once | INTRAVENOUS | Status: AC
  Administered 2017-12-15: 1400 mg via INTRAVENOUS
  Filled 2017-12-15: qty 26.3

## 2017-12-15 MED ORDER — DEXAMETHASONE SODIUM PHOSPHATE 100 MG/10ML IJ SOLN: 10.0000 mg | Freq: Once | INTRAMUSCULAR | Status: DC

## 2017-12-15 MED ORDER — SODIUM CHLORIDE 0.9 % IV SOLN
Freq: Once | INTRAVENOUS | Status: AC
  Administered 2017-12-15: 10:00:00 via INTRAVENOUS
  Filled 2017-12-15: qty 1000

## 2017-12-15 NOTE — Progress Notes (Signed)
Nutrition Follow-up:  Patient with metastatic bladder cancer receiving chemotherapy.Patient followed by Dr. Mike Gip  Met with patient today during infusion.  Patient reports appetite may be a little bit better.  Does not like unjury protein powder mixed in shakes.  Drinking premier protein shakes 2 per day but does not like them and is drinking them to keep his wife happy.  Patient reports he drank shake this am and wife planning to bring him chicken pot pie today for lunch from [facility].  Reports last night ate hamburger, noodles casserole and mid day had 2 eggs with 2 pieces of cheese sandwich.  Drank carnation instant breakfast yesterday am for breakfast.    Reports taking miralax and having bowel movement daily.    Medications: reviewed  Labs: reviewed  Anthropometrics:   Weight increased to 123 lb 6.4 oz since last seen by RD on 1/31 from 120 lb.    NUTRITION DIAGNOSIS: Malnutrition continues   MALNUTRITION DIAGNOSIS: Severe malnutrition continues   INTERVENTION:   Encouraged patient to eat every 2-3 hours of high calorie, high protein foods.  Discussed examples. Encouraged patient to continue premier protein shakes not willing to drink more than 2.  Not willing to add ice cream to increase calories.  "I don't want to mess up my ice cream."    MONITORING, EVALUATION, GOAL: weight trends, intake   NEXT VISIT: March 7 during infusion  Patrick Hooper B. Zenia Resides, [place], [place] Registered Dietitian [phone] (pager)

## 2017-12-15 NOTE — Progress Notes (Signed)
[facility] day:  12/15/2017   Chief Complaint: Patrick Hooper is a 72 y.o. male with metastatic bladder cancer who is seen for assessment prior to day 1 of cycle #3 of carboplatin and gemcitabine.  HPI:  The patient was last seen in the medical oncology clinic on 11/24/2017.  At that time,  he felt better.  He was orthostatic  He denied any fever.  Pain wass well controlled.  Exam was stable. WBC was 5000 with an [place] 3400.  Hemoglobin was 11.3, hematocrit 33.4, and platelets 758,000.  Calcium was 8.3 (corrected 8.8).  He received day 1 of cycle #2 carboplatin and gemcitabine at 80% dosing.  He received IVF.  Delton See was held.  Patient seen in the clinic on 11/28/2017 by Beckey Rutter, NP for urinary retention and constipation.  D-dimers were 7218.  He was referred to the ER.  Chest CT angiogram revealed no pulmonary embolism.  Abdomen and pelvic CT revealed a large amount of stool throughout the colon and down into the rectum suggesting constipation and fecal impaction.  He received an enema.  Patient did not receive chemotherapy on 12/01/2017.  WBC was 1800 with an ANC of 1000.  During the interim, patient has been doing better overall. He has continued shortness of breath. He continues to have unexplained vertigo symptoms with positional changes. Patient denies any fevers or sweats. He notes that he is eating better. His weight is down 1 pound.  Patient denies pain in the clinic today.  He states that he is weaning off his pain medications.    Past Medical History:  Diagnosis Date  . Bladder cancer ([place])   . Hypertension   . Lung cancer ([place]) 2016  . PVD (peripheral vascular disease) ([place])     Past Surgical History:  Procedure Laterality Date  . BLADDER SURGERY  08/12/2017  . BYPASS GRAFT FEMORAL/POPLITEAL W/VEIN  2009  . COLONOSCOPY  2013  . CYSTOSCOPY  08/12/2017  . IR ABDOMINAL AORTOBIFEMORAL CATHETER SERIALOGRAM  2009  . LOBECTOMY  Right 09/2015  . PACEMAKER INSERTION  2003  . PORTA CATH INSERTION N/A 11/21/2017   Procedure: PORTA CATH INSERTION;  Surgeon: Algernon Huxley, MD;  Location: [facility];  Service: Cardiovascular;  Laterality: N/A;    Family History  Problem Relation Age of Onset  . Cancer Mother   . Cancer Sister   . Cancer Maternal Grandmother     Social History:  reports that he has been smoking cigarettes.  He has a 55.00 pack-year smoking history. he has never used smokeless tobacco. He reports that he drinks alcohol. He reports that he does not use drugs.  He smokes 1 pack a day for > 55 years.  He used to drink 4 alcoholic beverages/day.  Now his alcohol intake is infrequent. 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 There is the potential of past exposure to radiation and/or toxins. He is registered with the [place] regarding his exposures.  He lives in [place].  The patient is accompanied by his girlfriend Optometrist) today.  Allergies:  Allergies  Allergen Reactions  . Cefazolin Rash and Shortness Of Breath  . Carvedilol Other (See Comments)    fatigue  . Chlorthalidone Other (See Comments)    Other reaction(s): Unknown  . Hydralazine     Other reaction(s): Unknown  . Hydrochlorothiazide Other (See Comments)    Current Medications: Current Outpatient Medications  Medication Sig Dispense Refill  . acetaminophen (TYLENOL) 500 MG tablet Take 500 mg by mouth every 6 (six) hours as needed for mild pain.     Marland Kitchen ALPRAZolam (XANAX) 0.25 MG tablet Take 1 tablet (0.25 mg total) by mouth 2 (two) times daily as needed for anxiety. 30 tablet 0  . aspirin EC 81 MG tablet Take 81 mg by mouth at bedtime.    . cholecalciferol (VITAMIN D) 400 units TABS tablet Take 800 Units by mouth daily.    . clopidogrel (PLAVIX) 75 MG tablet Take 1 tablet (75 mg total) by mouth at bedtime. 90 tablet 1  . dexamethasone (DECADRON) 4 MG tablet Take 2 tablets (8 mg total)  by mouth daily. Start the day after carboplatin chemotherapy for 2 days. 30 tablet 1  . docusate sodium (COLACE) 100 MG capsule Take 100 mg 2 (two) times daily by mouth.    . gabapentin (NEURONTIN) 100 MG capsule Take 1 capsule (100 mg total) by mouth at bedtime. 30 capsule 3  . lactulose (CHRONULAC) 10 GM/15ML solution Take 45 mLs (30 g total) by mouth 2 (two) times daily as needed for mild constipation or severe constipation. 1892 mL 0  . lidocaine-prilocaine (EMLA) cream Apply 1 application topically PRN 1 hour prior to procedure. Cover with press and seal until treatment. 30 g 0  . LORazepam (ATIVAN) 0.5 MG tablet Take 1 tablet (0.5 mg total) by mouth every 6 (six) hours as needed (Nausea or vomiting). 30 tablet 0  . megestrol (MEGACE ORAL) 40 MG/ML suspension Take 5 mLs (200 mg total) by mouth daily. 240 mL 0  . mirtazapine (REMERON) 15 MG tablet Take 15 mg at bedtime by mouth.    . morphine (MS CONTIN) 15 MG 12 hr tablet Take 1 tablet (15 mg total) by mouth every 12 (twelve) hours. 30 tablet 0  . ondansetron (ZOFRAN) 4 MG tablet Take 1 tablet (4 mg total) by mouth every 6 (six) hours as needed for nausea. 20 tablet 0  . oxycodone (OXY-IR) 5 MG capsule Take 1 capsule (5 mg total) by mouth every 4 (four) hours as needed for pain. 30 capsule 0  . polyethylene glycol (MIRALAX / GLYCOLAX) packet Take 17 g by mouth daily. (Patient taking differently: Take 17 g by mouth daily as needed. ) 30 each 0  . polyethylene glycol (MIRALAX) packet Take 17 g by mouth daily. 14 each 0  . protein supplement shake (PREMIER PROTEIN) LIQD Take 325 mLs (11 oz total) by mouth 2 (two) times daily between meals. 60 Can 0   No current facility-administered medications for this visit.    Facility-Administered Medications Ordered in Other Visits  Medication Dose Route Frequency Provider Last Rate Last Dose  . 0.9 %  sodium chloride infusion   Intravenous Continuous Verlon Au, NP 500 mL/hr at 10/31/17 1400    .  heparin lock flush 100 unit/mL  500 Units Intravenous Once Ekam Bonebrake C, MD      . sodium chloride flush (NS) 0.9 % injection 10 mL  10 mL Intravenous PRN Lequita Asal, MD   10 mL at 12/15/17 0831    Review of Systems:  GENERAL:  Feels "better".  No fever or sweats.  Weight down 1 pound since last visit.  PERFORMANCE STATUS (ECOG):  2 HEENT:  No visual changes, sore throat, mouth sores or tenderness. Lungs: Shortness of breath with exertion.  Cough.  No hemoptysis. Cardiac:  Implanted pacemaker (NOT MRI COMPATIBLE). No chest pain,  palpitations, orthopnea, or PND.  Blood pressure fluctuates.  Off amlodipine. GI:  Constipation.  Taking stool softener BID and Miralax q day.  No nausea, vomiting, diarrhea, melena or hematochezia. GU:  Dribbling.  Urgency, frequency, dysuria and nocturia.  No hematuria. Musculoskeletal:  Chronic bone pain (lower ribs, shoulders, back, neck, shoulder), well controlled.  Joint pain (left wrist and right elbow). No muscle tenderness. Extremities:  No pain or swelling. Skin:  No rashes or skin changes. Neuro: Vertigo.  No headache.  4th digit of left hand change in sensation.  No numbness or weakness, balance or coordination issues. Endocrine:  No diabetes, thyroid issues, hot flashes or night sweats. Psych:  No mood changes, depression or anxiety. Pain:  No focal pain. Review of systems:  All other systems reviewed and found to be negative.  Physical Exam: Blood pressure 105/74, pulse (!) 111, temperature (!) 97 F (36.1 C), resp. rate 12, height _0  (1.803 m), weight 123 lb 6.4 oz (56 kg). GENERAL:  Thin gentleman sitting comfortably in a wheelchair in the exam room in no acute distress. MENTAL STATUS:  Alert and oriented to person, place and time. HEAD:  Thin gray hair.  Normocephalic, atraumatic, face symmetric, no Cushingoid features. EYES:  Blue eyes.  Pupils equal round and reactive to light and accomodation.  No conjunctivitis or scleral  icterus. ENT:  Oropharynx clear without lesion.  Tongue normal.  Upper dentures.  No lower teeth.  Mucous membranes moist.  CHEST:  Left sided pacemaker. RESPIRATORY:  Clear to auscultation without rales, wheezes or rhonchi. CARDIOVASCULAR:  Regular rate and rhythm without murmur, rub or gallop. ABDOMEN:  Soft, non-tender, with active bowel sounds, and no hepatosplenomegaly.  No masses. SKIN:  No rashes, ulcers or lesions. EXTREMITIES: Nicotine stained nails.  No edema, no skin discoloration or tenderness.  No palpable cords. LYMPH NODES: No palpable cervical, supraclavicular, axillary or inguinal adenopathy  NEUROLOGICAL: Unremarkable. PSYCH:  Appropriate.   Infusion on 12/15/2017  Component Date Value Ref Range Status  . Magnesium 12/15/2017 1.8  1.7 - 2.4 mg/dL Final   Performed at [facility], [address], [place], [place] [address]  . Sodium 12/15/2017 130* 135 - 145 mmol/L Final  . Potassium 12/15/2017 3.4* 3.5 - 5.1 mmol/L Final  . Chloride 12/15/2017 98* 101 - 111 mmol/L Final  . CO2 12/15/2017 22  22 - 32 mmol/L Final  . Glucose, Bld 12/15/2017 116* 65 - 99 mg/dL Final  . BUN 12/15/2017 13  6 - 20 mg/dL Final  . Creatinine, Ser 12/15/2017 0.60* 0.61 - 1.24 mg/dL Final  . Calcium 12/15/2017 8.5* 8.9 - 10.3 mg/dL Final  . Total Protein 12/15/2017 6.9  6.5 - 8.1 g/dL Final  . Albumin 12/15/2017 3.5  3.5 - 5.0 g/dL Final  . AST 12/15/2017 31  15 - 41 U/L Final  . ALT 12/15/2017 26  17 - 63 U/L Final  . Alkaline Phosphatase 12/15/2017 113  38 - 126 U/L Final  . Total Bilirubin 12/15/2017 0.5  0.3 - 1.2 mg/dL Final  . GFR calc non Af Amer 12/15/2017 >60  >60 mL/min Final  . GFR calc Af Amer 12/15/2017 >60  >60 mL/min Final   Comment: (NOTE) The eGFR has been calculated using the CKD EPI equation. This calculation has not been validated in all clinical situations. eGFR's persistently <60 mL/min signify possible Chronic Kidney Disease.   . Anion gap 12/15/2017 10  5  - 15 Final   Performed at [facility],  [address], [place], [place] [address]  . WBC 12/15/2017 8.2  3.8 - 10.6 K/uL Final  . RBC 12/15/2017 3.77* 4.40 - 5.90 MIL/uL Final  . Hemoglobin 12/15/2017 11.6* 13.0 - 18.0 g/dL Final  . HCT 12/15/2017 33.3* 40.0 - 52.0 % Final  . MCV 12/15/2017 88.5  80.0 - 100.0 fL Final  . MCH 12/15/2017 30.8  26.0 - 34.0 pg Final  . MCHC 12/15/2017 34.8  32.0 - 36.0 g/dL Final  . RDW 12/15/2017 25.1* 11.5 - 14.5 % Final  . Platelets 12/15/2017 494* 150 - 440 K/uL Final  . Neutrophils Relative % 12/15/2017 59  % Final  . Neutro Abs 12/15/2017 4.9  1.4 - 6.5 K/uL Final  . Lymphocytes Relative 12/15/2017 22  % Final  . Lymphs Abs 12/15/2017 1.8  1.0 - 3.6 K/uL Final  . Monocytes Relative 12/15/2017 17  % Final  . Monocytes Absolute 12/15/2017 1.4* 0.2 - 1.0 K/uL Final  . Eosinophils Relative 12/15/2017 1  % Final  . Eosinophils Absolute 12/15/2017 0.1  0 - 0.7 K/uL Final  . Basophils Relative 12/15/2017 1  % Final  . Basophils Absolute 12/15/2017 0.1  0 - 0.1 K/uL Final   Performed at [facility], [address], [place], [place] [address]    Assessment:  Mats Jeanlouis is a 72 y.o. male with metastatic disease.  He has a recent history of muscle invasive bladder cancer and a distant history of lung cancer.  He has a history of stage IB lung cancer status post right upper lobe lobectomy in 09/2015.  Pathology revealed a [id number] adenocarcinoma.  Chest CT on 05/25/2017 revealed postoperative changes from a right upper lobe lobectomy without evidence of recurrence. There was new diffuse punctate sclerotic osseous lesions throughout the chest concerning for metastatic disease.  [facility] scan on 06/08/2017 at [facility] revealed right upper lobe lobectomy without evidence of recurrence. There was a new 3.2 cm mass along the right inferior bladder wall, anterior to the ureteral vesicular junction.  There were multiple new focal sclerotic and  some probable mixed lytic and sclerotic osseous lesions in the axial skeleton many of which demonstrated increased FDG avidity. Differential diagnoses included include metastatic lung cancer, bladder cancer or prostate cancer. There was increased FDG uptake in the left medial thigh musculature of uncertain etiology.  PET scan on 09/09/2017 revealed several hypermetabolic mixed lytic and sclerotic osseous metastases throughout the axial skeleton.  There was suspected intramuscular soft tissue metastases in the right quadratus lumborum and posterior left lumbar paraspinal musculature.  There was mildly hypermetabolic retroperitoneal lymphadenopathy, probably representing nodal metastases.  There was diffuse irregular bladder wall thickening, asymmetrically prominent in the right bladder wall, suspicious for residual/recurrent bladder neoplasm.   There was nonspecific small ground-glass nodular opacity in the left upper lung lobe with low level metabolism.  There was no evidence of local tumor recurrence in the right lung status post right upper lobectomy   He underwent transurethral resection of bladder tumor (TURBT) with bilateral retrograde pyelogram on 08/12/2017.  He had a 3 cm bladder tumor on the right lateral wall with some sessile and papillary features.  Retrograde pyelograms were normal without filling defect.  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  He began monthly Xgeva on 09/16/2017 (last 10/27/2017).  He is s/p 2 cycles of carboplatin and gemcitabine (11/03/2017 - 11/24/2017).  Cycle #1 was complicated by orthostatic hypotension and fatigue requiring IVF and delay in day  8 gemcitabine (80% dose).  Cycle #2 was complicated by fecal impaction.  He did not receive gemcitabine of day 8 (WBC 1800 with an ANC of 1000).  He has had issues with hypotension despite a history of hypertension.  He is off blood pressure medications.  Cortisol level was  normal on 10/27/2017.  He has a normocytic anemia.  Work-up on 11/24/2017 revealed a normal ferritin, iron studies, folate.  B12 was 243.  He has a history of peripheral vascular disease s/p fem-fem bypass in 2016. He is on Plavix.  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  Colonoscopy was normal in 2011.  Symptomatically, he feels better.  He has unexplained vertigo symptoms associated with position changes. He denies any fever.  He has had issues with constipation.  Pain is well controlled.  Exam is stable. WBC is 8200 with an [place] 4900.  Hemoglobin is 11.6, hematocrit 33.3, and platelets 494,000.  Calcium is 8.8 (corrected 8.9).  Plan: 1.  Labs today:  CBC with diff, CMP, Mg, MMA. 2.  Calcium low. Will hold Xgeva today. Discuss need to add calcium 1200 mg and vitamin D 800 IU daily.  3.  Labs reviewed. Blood counts stable and adequate for treatment. Proceed with day 1 of cycle #3 carboplatin and gemcitabine (80% dose). 4.  Discuss cardiology referral. Patient cannot be seen with [facility] until 01/18/2018. Family to call and let us know if she can get in elsewhere. 5.. RTC in 1 week for labs (CBC with diff, CMP, Mg), +/- IVF, +/- Xgeva, and day 8 of cycle #3 gemcitabine. 6.  RTC in 2 weeks for labs (CBC with diff, BMP) +/- IVF. 7.  RTC in 3 weeks for MD assessment, labs (CBC with diff, CMP, Mg), +/- IVFs, and day 1 of cycle #4 carboplatin + gemcitabine.   Honor Loh, NP  12/15/2017, 9:39 AM   I saw and evaluated the patient, participating in the key portions of the service and reviewing pertinent diagnostic studies and records.  I reviewed the nurse practitioner's note and agree with the findings and the plan.  The assessment and plan were discussed with the patient.  Several questions were asked by the patient and answered.   Nolon Stalls, MD 12/15/2017,9:39 AM

## 2017-12-15 NOTE — Progress Notes (Signed)
No Xgeva today, 12/15/17.

## 2017-12-15 NOTE — Progress Notes (Signed)
See follow up

## 2017-12-17 LAB — METHYLMALONIC ACID, SERUM: Methylmalonic Acid, Quantitative: 151 nmol/L (ref 0–378)

## 2017-12-19 ENCOUNTER — Inpatient Hospital Stay: Payer: Medicare Other

## 2017-12-19 DIAGNOSIS — C7951 Secondary malignant neoplasm of bone: Secondary | ICD-10-CM

## 2017-12-19 DIAGNOSIS — Z5111 Encounter for antineoplastic chemotherapy: Secondary | ICD-10-CM | POA: Diagnosis not present

## 2017-12-19 MED ORDER — CYANOCOBALAMIN 1000 MCG/ML IJ SOLN
1000.0000 ug | Freq: Once | INTRAMUSCULAR | Status: AC
  Administered 2017-12-19: 1000 ug via INTRAMUSCULAR
  Filled 2017-12-19: qty 1

## 2017-12-22 ENCOUNTER — Inpatient Hospital Stay: Payer: Medicare Other

## 2017-12-22 DIAGNOSIS — C672 Malignant neoplasm of lateral wall of bladder: Secondary | ICD-10-CM

## 2017-12-22 DIAGNOSIS — C7951 Secondary malignant neoplasm of bone: Secondary | ICD-10-CM

## 2017-12-22 DIAGNOSIS — Z5111 Encounter for antineoplastic chemotherapy: Secondary | ICD-10-CM | POA: Diagnosis not present

## 2017-12-22 LAB — CBC WITH DIFFERENTIAL/PLATELET
Basophils Absolute: 0 10*3/uL (ref 0–0.1)
Basophils Relative: 1 %
Eosinophils Absolute: 0.1 10*3/uL (ref 0–0.7)
Eosinophils Relative: 1 %
HCT: 28.7 % — ABNORMAL LOW (ref 40.0–52.0)
Hemoglobin: 10 g/dL — ABNORMAL LOW (ref 13.0–18.0)
Lymphocytes Relative: 31 %
Lymphs Abs: 1.2 10*3/uL (ref 1.0–3.6)
MCH: 31.1 pg (ref 26.0–34.0)
MCHC: 34.7 g/dL (ref 32.0–36.0)
MCV: 89.5 fL (ref 80.0–100.0)
Monocytes Absolute: 0.2 10*3/uL (ref 0.2–1.0)
Monocytes Relative: 4 %
Neutro Abs: 2.4 10*3/uL (ref 1.4–6.5)
Neutrophils Relative %: 63 %
Platelets: 179 10*3/uL (ref 150–440)
RBC: 3.21 MIL/uL — ABNORMAL LOW (ref 4.40–5.90)
RDW: 23.5 % — ABNORMAL HIGH (ref 11.5–14.5)
WBC: 3.8 10*3/uL (ref 3.8–10.6)

## 2017-12-22 LAB — COMPREHENSIVE METABOLIC PANEL
ALT: 48 U/L (ref 17–63)
AST: 25 U/L (ref 15–41)
Albumin: 3.1 g/dL — ABNORMAL LOW (ref 3.5–5.0)
Alkaline Phosphatase: 91 U/L (ref 38–126)
Anion gap: 5 (ref 5–15)
BUN: 19 mg/dL (ref 6–20)
CO2: 23 mmol/L (ref 22–32)
Calcium: 8.7 mg/dL — ABNORMAL LOW (ref 8.9–10.3)
Chloride: 104 mmol/L (ref 101–111)
Creatinine, Ser: 0.48 mg/dL — ABNORMAL LOW (ref 0.61–1.24)
GFR calc Af Amer: >60 mL/min (ref >60)
GFR calc non Af Amer: >60 mL/min (ref >60)
Glucose, Bld: 96 mg/dL (ref 65–99)
Potassium: 3.4 mmol/L — ABNORMAL LOW (ref 3.5–5.1)
Sodium: 132 mmol/L — ABNORMAL LOW (ref 135–145)
Total Bilirubin: 0.3 mg/dL (ref 0.3–1.2)
Total Protein: 6.4 g/dL — ABNORMAL LOW (ref 6.5–8.1)

## 2017-12-22 LAB — MAGNESIUM: Magnesium: 1.8 mg/dL (ref 1.7–2.4)

## 2017-12-22 MED ORDER — SODIUM CHLORIDE 0.9 % IV SOLN: Freq: Once | INTRAVENOUS | Status: DC

## 2017-12-22 MED ORDER — HEPARIN SOD (PORK) LOCK FLUSH 100 UNIT/ML IV SOLN
500.0000 [IU] | Freq: Once | INTRAVENOUS | Status: AC | PRN
  Administered 2017-12-22: 500 [IU]
  Filled 2017-12-22: qty 5

## 2017-12-22 MED ORDER — SODIUM CHLORIDE 0.9 % IV SOLN
Freq: Once | INTRAVENOUS | Status: AC
  Administered 2017-12-22: 10:00:00 via INTRAVENOUS
  Filled 2017-12-22: qty 1000

## 2017-12-22 MED ORDER — DENOSUMAB 120 MG/1.7ML ~~LOC~~ SOLN
120.0000 mg | Freq: Once | SUBCUTANEOUS | Status: AC
  Administered 2017-12-22: 120 mg via SUBCUTANEOUS
  Filled 2017-12-22: qty 1.7

## 2017-12-22 MED ORDER — SODIUM CHLORIDE 0.9 % IV SOLN
1400.0000 mg | Freq: Once | INTRAVENOUS | Status: AC
  Administered 2017-12-22: 1400 mg via INTRAVENOUS
  Filled 2017-12-22: qty 26.3

## 2017-12-22 MED ORDER — ONDANSETRON HCL 4 MG PO TABS
8.0000 mg | ORAL_TABLET | Freq: Once | ORAL | Status: AC
  Administered 2017-12-22: 8 mg via ORAL
  Filled 2017-12-22: qty 2

## 2017-12-26 ENCOUNTER — Inpatient Hospital Stay: Payer: Medicare Other

## 2017-12-26 DIAGNOSIS — C7951 Secondary malignant neoplasm of bone: Secondary | ICD-10-CM

## 2017-12-26 DIAGNOSIS — Z5111 Encounter for antineoplastic chemotherapy: Secondary | ICD-10-CM | POA: Diagnosis not present

## 2017-12-26 MED ORDER — CYANOCOBALAMIN 1000 MCG/ML IJ SOLN
1000.0000 ug | Freq: Once | INTRAMUSCULAR | Status: AC
  Administered 2017-12-26: 1000 ug via INTRAMUSCULAR

## 2017-12-27 ENCOUNTER — Encounter: Payer: Self-pay | Admitting: Family Medicine

## 2017-12-29 ENCOUNTER — Telehealth: Payer: Self-pay | Admitting: *Deleted

## 2017-12-29 ENCOUNTER — Inpatient Hospital Stay: Payer: Medicare Other

## 2017-12-29 ENCOUNTER — Other Ambulatory Visit: Payer: Self-pay | Admitting: *Deleted

## 2017-12-29 VITALS — BP 134/84 | HR 78 | Temp 97.9°F | Resp 16

## 2017-12-29 DIAGNOSIS — C672 Malignant neoplasm of lateral wall of bladder: Secondary | ICD-10-CM

## 2017-12-29 DIAGNOSIS — C7951 Secondary malignant neoplasm of bone: Secondary | ICD-10-CM

## 2017-12-29 DIAGNOSIS — Z5111 Encounter for antineoplastic chemotherapy: Secondary | ICD-10-CM | POA: Diagnosis not present

## 2017-12-29 LAB — CBC WITH DIFFERENTIAL/PLATELET
Basophils Absolute: 0 10*3/uL (ref 0–0.1)
Basophils Relative: 1 %
Eosinophils Absolute: 0 10*3/uL (ref 0–0.7)
Eosinophils Relative: 1 %
HCT: 27.9 % — ABNORMAL LOW (ref 40.0–52.0)
Hemoglobin: 9.8 g/dL — ABNORMAL LOW (ref 13.0–18.0)
Lymphocytes Relative: 41 %
Lymphs Abs: 1.1 10*3/uL (ref 1.0–3.6)
MCH: 31.8 pg (ref 26.0–34.0)
MCHC: 35.2 g/dL (ref 32.0–36.0)
MCV: 90.4 fL (ref 80.0–100.0)
Monocytes Absolute: 0.3 10*3/uL (ref 0.2–1.0)
Monocytes Relative: 13 %
Neutro Abs: 1.2 10*3/uL — ABNORMAL LOW (ref 1.4–6.5)
Neutrophils Relative %: 44 %
Platelets: 23 10*3/uL — CL (ref 150–400)
RBC: 3.09 MIL/uL — ABNORMAL LOW (ref 4.40–5.90)
RDW: 23.3 % — ABNORMAL HIGH (ref 11.5–14.5)
WBC: 2.6 10*3/uL — ABNORMAL LOW (ref 3.8–10.6)

## 2017-12-29 LAB — BASIC METABOLIC PANEL
Anion gap: 8 (ref 5–15)
BUN: 15 mg/dL (ref 6–20)
CO2: 19 mmol/L — ABNORMAL LOW (ref 22–32)
Calcium: 8.7 mg/dL — ABNORMAL LOW (ref 8.9–10.3)
Chloride: 103 mmol/L (ref 101–111)
Creatinine, Ser: 0.53 mg/dL — ABNORMAL LOW (ref 0.61–1.24)
GFR calc Af Amer: >60 mL/min (ref >60)
GFR calc non Af Amer: >60 mL/min (ref >60)
Glucose, Bld: 99 mg/dL (ref 65–99)
Potassium: 3.7 mmol/L (ref 3.5–5.1)
Sodium: 130 mmol/L — ABNORMAL LOW (ref 135–145)

## 2017-12-29 MED ORDER — SODIUM CHLORIDE 0.9 % IV SOLN
Freq: Once | INTRAVENOUS | Status: AC
  Administered 2017-12-29: 11:00:00 via INTRAVENOUS
  Filled 2017-12-29: qty 1000

## 2017-12-29 NOTE — Telephone Encounter (Signed)
Critical platelet - 23  MD notified.

## 2017-12-29 NOTE — Telephone Encounter (Signed)
Per Rodena Piety 12/29/17 staff message Add lab appointment for 12/30/17 Patient is aware of time

## 2017-12-29 NOTE — Telephone Encounter (Signed)
Called patient and LVM that he will need to come tomorrow for a lab.  Scheduling will call him with the time.

## 2017-12-30 ENCOUNTER — Other Ambulatory Visit: Payer: Self-pay | Admitting: Urgent Care

## 2017-12-30 ENCOUNTER — Inpatient Hospital Stay: Payer: Medicare Other | Attending: Radiation Oncology

## 2017-12-30 DIAGNOSIS — F17210 Nicotine dependence, cigarettes, uncomplicated: Secondary | ICD-10-CM | POA: Insufficient documentation

## 2017-12-30 DIAGNOSIS — R42 Dizziness and giddiness: Secondary | ICD-10-CM | POA: Diagnosis not present

## 2017-12-30 DIAGNOSIS — G893 Neoplasm related pain (acute) (chronic): Secondary | ICD-10-CM | POA: Diagnosis not present

## 2017-12-30 DIAGNOSIS — C672 Malignant neoplasm of lateral wall of bladder: Secondary | ICD-10-CM | POA: Diagnosis not present

## 2017-12-30 DIAGNOSIS — I739 Peripheral vascular disease, unspecified: Secondary | ICD-10-CM | POA: Insufficient documentation

## 2017-12-30 DIAGNOSIS — I10 Essential (primary) hypertension: Secondary | ICD-10-CM | POA: Insufficient documentation

## 2017-12-30 DIAGNOSIS — D649 Anemia, unspecified: Secondary | ICD-10-CM | POA: Insufficient documentation

## 2017-12-30 DIAGNOSIS — R0602 Shortness of breath: Secondary | ICD-10-CM | POA: Insufficient documentation

## 2017-12-30 DIAGNOSIS — Z79899 Other long term (current) drug therapy: Secondary | ICD-10-CM | POA: Diagnosis not present

## 2017-12-30 DIAGNOSIS — Z85118 Personal history of other malignant neoplasm of bronchus and lung: Secondary | ICD-10-CM | POA: Insufficient documentation

## 2017-12-30 DIAGNOSIS — Z5111 Encounter for antineoplastic chemotherapy: Secondary | ICD-10-CM | POA: Insufficient documentation

## 2017-12-30 DIAGNOSIS — Z808 Family history of malignant neoplasm of other organs or systems: Secondary | ICD-10-CM | POA: Insufficient documentation

## 2017-12-30 DIAGNOSIS — C7951 Secondary malignant neoplasm of bone: Secondary | ICD-10-CM | POA: Insufficient documentation

## 2017-12-30 LAB — CBC WITH DIFFERENTIAL/PLATELET
Basophils Absolute: 0 10*3/uL (ref 0–0.1)
Basophils Relative: 1 %
Eosinophils Absolute: 0.1 10*3/uL (ref 0–0.7)
Eosinophils Relative: 2 %
HCT: 31.9 % — ABNORMAL LOW (ref 40.0–52.0)
Hemoglobin: 11.1 g/dL — ABNORMAL LOW (ref 13.0–18.0)
Lymphocytes Relative: 28 %
Lymphs Abs: 1.3 10*3/uL (ref 1.0–3.6)
MCH: 31.8 pg (ref 26.0–34.0)
MCHC: 34.6 g/dL (ref 32.0–36.0)
MCV: 91.8 fL (ref 80.0–100.0)
Monocytes Absolute: 0.5 10*3/uL (ref 0.2–1.0)
Monocytes Relative: 11 %
Neutro Abs: 2.6 10*3/uL (ref 1.4–6.5)
Neutrophils Relative %: 58 %
Platelets: 45 10*3/uL — ABNORMAL LOW (ref 150–440)
RBC: 3.48 MIL/uL — ABNORMAL LOW (ref 4.40–5.90)
RDW: 24.3 % — ABNORMAL HIGH (ref 11.5–14.5)
WBC: 4.5 10*3/uL (ref 3.8–10.6)

## 2018-01-02 ENCOUNTER — Inpatient Hospital Stay: Payer: Medicare Other

## 2018-01-02 DIAGNOSIS — C7951 Secondary malignant neoplasm of bone: Secondary | ICD-10-CM

## 2018-01-02 DIAGNOSIS — Z5111 Encounter for antineoplastic chemotherapy: Secondary | ICD-10-CM | POA: Diagnosis not present

## 2018-01-02 MED ORDER — CYANOCOBALAMIN 1000 MCG/ML IJ SOLN
1000.0000 ug | Freq: Once | INTRAMUSCULAR | Status: AC
  Administered 2018-01-02: 1000 ug via INTRAMUSCULAR

## 2018-01-04 NOTE — Progress Notes (Signed)
[facility] day:  01/05/2018   Chief Complaint: Patrick Hooper is a 72 y.o. male with metastatic bladder cancer who is seen for assessment prior to day 1 of cycle #4 of carboplatin and gemcitabine.  HPI:  The patient was last seen in the medical oncology clinic on 12/15/2017.  At that time, he felt better.  He had unexplained vertigo symptoms associated with position changes. He denied any fever.  He had issues with constipation.  Pain was well controlled.  Exam was stable. WBC was 8200 with an [place].  Hemoglobin was 11.6, hematocrit 33.3, and platelets 494,000.  Calcium was 8.8 (corrected 8.9).  He began cycle #3 carboplatin and gemcitabine.  He received day 8 gemcitabine and Xgeva on 12/22/2017.  He continued weekly B12 (02/18, 02/25, and 01/02/2018).  CBC on 12/29/2017 revealed a hematocrit of 27.9, hemoglobin 9.8, platelets 23,000, WBC 2600 and ANC 1200.  During the interim, patient has been doing well. Patient has shortness of breath.  Patient continues to have positional vertigo. He notes that he has been seen by cardiology and advised that he "has the heart of a 72 year old" and that they had "no idea why he was having symptoms". Pain and nausea are well managed with the prescribed interventions. Patient's continues to have issues with constipation that is attributed to his opioid use. Symptom improved with stool softeners.   Patient denies B symptoms. He is eating well. His weight has increased 5 pounds since his last visit. Patient complains of pain rated 3/10 today.   Patient continues to smoke. He has no plans on quitting. Patient states, "At this point, why does it matter? It can't shave too much time off my life".    Past Medical History:  Diagnosis Date  . Bladder cancer ([place])   . Hypertension   . Lung cancer ([place]) 2016  . PVD (peripheral vascular disease) ([place])     Past Surgical History:  Procedure Laterality Date  . BLADDER  SURGERY  08/12/2017  . BYPASS GRAFT FEMORAL/POPLITEAL W/VEIN  2009  . COLONOSCOPY  2013  . CYSTOSCOPY  08/12/2017  . IR ABDOMINAL AORTOBIFEMORAL CATHETER SERIALOGRAM  2009  . LOBECTOMY Right 09/2015  . PACEMAKER INSERTION  2003  . PORTA CATH INSERTION N/A 11/21/2017   Procedure: PORTA CATH INSERTION;  Surgeon: Algernon Huxley, MD;  Location: [facility];  Service: Cardiovascular;  Laterality: N/A;    Family History  Problem Relation Age of Onset  . Cancer Mother   . Cancer Sister   . Cancer Maternal Grandmother     Social History:  reports that he has been smoking cigarettes.  He has a 55.00 pack-year smoking history. he has never used smokeless tobacco. He reports that he drinks alcohol. He reports that he does not use drugs.  He smokes 1 pack a day for > 55 years.  He used to drink 4 alcoholic beverages/day.  Now his alcohol intake is infrequent. 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 There is the potential of past exposure to radiation and/or toxins. He is registered with the [place] regarding his exposures.  He lives in [place].  The patient is accompanied by his girlfriend Optometrist) today.  Allergies:  Allergies  Allergen Reactions  . Cefazolin Rash and Shortness Of Breath  . Carvedilol Other (See Comments)    fatigue  . Chlorthalidone Other (See Comments)    Other  reaction(s): Unknown  . Hydralazine     Other reaction(s): Unknown  . Hydrochlorothiazide Other (See Comments)    Current Medications: Current Outpatient Medications  Medication Sig Dispense Refill  . acetaminophen (TYLENOL) 500 MG tablet Take 500 mg by mouth every 6 (six) hours as needed for mild pain.     Marland Kitchen ALPRAZolam (XANAX) 0.25 MG tablet Take 1 tablet (0.25 mg total) by mouth 2 (two) times daily as needed for anxiety. 30 tablet 0  . aspirin EC 81 MG tablet Take 81 mg by mouth at bedtime.    . cholecalciferol (VITAMIN D) 400 units TABS tablet Take 800  Units by mouth daily.    . clopidogrel (PLAVIX) 75 MG tablet Take 1 tablet (75 mg total) by mouth at bedtime. 90 tablet 1  . dexamethasone (DECADRON) 4 MG tablet Take 2 tablets (8 mg total) by mouth daily. Start the day after carboplatin chemotherapy for 2 days. 30 tablet 1  . docusate sodium (COLACE) 100 MG capsule Take 100 mg 2 (two) times daily by mouth.    . gabapentin (NEURONTIN) 100 MG capsule Take 1 capsule (100 mg total) by mouth at bedtime. 30 capsule 3  . lidocaine-prilocaine (EMLA) cream Apply 1 application topically PRN 1 hour prior to procedure. Cover with press and seal until treatment. 30 g 0  . megestrol (MEGACE ORAL) 40 MG/ML suspension Take 5 mLs (200 mg total) by mouth daily. 240 mL 0  . mirtazapine (REMERON) 15 MG tablet Take 15 mg at bedtime by mouth.    . ondansetron (ZOFRAN) 4 MG tablet Take 1 tablet (4 mg total) by mouth every 6 (six) hours as needed for nausea. 20 tablet 0  . polyethylene glycol (MIRALAX / GLYCOLAX) packet Take 17 g by mouth daily. (Patient taking differently: Take 17 g by mouth daily as needed. ) 30 each 0  . potassium chloride SA (K-DUR,KLOR-CON) 20 MEQ tablet Take 1 tablet (20 mEq total) by mouth daily. 90 tablet 1  . lactulose (CHRONULAC) 10 GM/15ML solution Take 45 mLs (30 g total) by mouth 2 (two) times daily as needed for mild constipation or severe constipation. (Patient not taking: Reported on 01/05/2018) 1892 mL 0  . LORazepam (ATIVAN) 0.5 MG tablet Take 1 tablet (0.5 mg total) by mouth every 6 (six) hours as needed (Nausea or vomiting). (Patient not taking: Reported on 01/05/2018) 30 tablet 0  . morphine (MS CONTIN) 15 MG 12 hr tablet Take 1 tablet (15 mg total) by mouth every 12 (twelve) hours. (Patient not taking: Reported on 01/05/2018) 30 tablet 0  . oxycodone (OXY-IR) 5 MG capsule Take 1 capsule (5 mg total) by mouth every 4 (four) hours as needed for pain. (Patient not taking: Reported on 01/05/2018) 30 capsule 0  . protein supplement shake (PREMIER  PROTEIN) LIQD Take 325 mLs (11 oz total) by mouth 2 (two) times daily between meals. (Patient not taking: Reported on 01/05/2018) 60 Can 0   No current facility-administered medications for this visit.    Facility-Administered Medications Ordered in Other Visits  Medication Dose Route Frequency Provider Last Rate Last Dose  . 0.9 %  sodium chloride infusion   Intravenous Continuous Verlon Au, NP 500 mL/hr at 10/31/17 1400      Review of Systems:  GENERAL:  Feels "not bad".  No fever or sweats.  Weight up 5 pounds since last visit.  PERFORMANCE STATUS (ECOG):  2 HEENT:  No visual changes, sore throat, mouth sores or tenderness. Lungs: Shortness of breath  with exertion.  Cough.  No hemoptysis. Cardiac:  Implanted pacemaker (NOT MRI COMPATIBLE). No chest pain, palpitations, orthopnea, or PND.  Blood pressure fluctuates.  Off amlodipine. GI:  Constipation.  Taking stool softener BID and Miralax q day.  No nausea, vomiting, diarrhea, melena or hematochezia. GU:  Dribbling.  Urgency, frequency, dysuria and nocturia.  No hematuria. Musculoskeletal:  Chronic bone pain (lower ribs, shoulders, back, neck, shoulder), well controlled.  Joint pain (left wrist and right elbow). No muscle tenderness. Extremities:  No pain or swelling. Skin:  No rashes or skin changes. Neuro: Vertigo.  No headache.  4th digit of left hand change in sensation.  No numbness or weakness, balance or coordination issues. Endocrine:  No diabetes, thyroid issues, hot flashes or night sweats. Psych:  No mood changes, depression or anxiety. Pain:  3/10 - chronic bone pain.  Review of systems:  All other systems reviewed and found to be negative.  Physical Exam: Blood pressure 113/79, pulse 97, temperature (!) 97 F (36.1 C), temperature source Tympanic, resp. rate 18, weight 128 lb (58.1 kg). GENERAL:  Thin gentleman sitting comfortably in a wheelchair in the exam room in no acute distress. MENTAL STATUS:  Alert and  oriented to person, place and time. HEAD:  Thin gray hair.  Normocephalic, atraumatic, face symmetric, no Cushingoid features. EYES:  Blue eyes.  Pupils equal round and reactive to light and accomodation.  No conjunctivitis or scleral icterus. ENT:  Oropharynx clear without lesion.  Tongue normal.  Upper dentures.  No lower teeth.  Mucous membranes moist.  CHEST:  Left sided pacemaker. RESPIRATORY:  Clear to auscultation without rales, wheezes or rhonchi. CARDIOVASCULAR:  Regular rate and rhythm without murmur, rub or gallop. ABDOMEN:  Soft, non-tender, with active bowel sounds, and no hepatosplenomegaly.  No masses. SKIN:  No rashes, ulcers or lesions. EXTREMITIES: Nicotine stained nails.  No edema, no skin discoloration or tenderness.  No palpable cords. LYMPH NODES: No palpable cervical, supraclavicular, axillary or inguinal adenopathy  NEUROLOGICAL: Unremarkable. PSYCH:  Appropriate.   Appointment on 01/05/2018  Component Date Value Ref Range Status  . Magnesium 01/05/2018 1.8  1.7 - 2.4 mg/dL Final   Performed at [facility], [address] [place], [place] [address]  . Sodium 01/05/2018 130* 135 - 145 mmol/L Final  . Potassium 01/05/2018 3.8  3.5 - 5.1 mmol/L Final  . Chloride 01/05/2018 102  101 - 111 mmol/L Final  . CO2 01/05/2018 20* 22 - 32 mmol/L Final  . Glucose, Bld 01/05/2018 96  65 - 99 mg/dL Final  . BUN 01/05/2018 13  6 - 20 mg/dL Final  . Creatinine, Ser 01/05/2018 0.57* 0.61 - 1.24 mg/dL Final  . Calcium 01/05/2018 8.6* 8.9 - 10.3 mg/dL Final  . Total Protein 01/05/2018 6.8  6.5 - 8.1 g/dL Final  . Albumin 01/05/2018 3.4* 3.5 - 5.0 g/dL Final  . AST 01/05/2018 22  15 - 41 U/L Final  . ALT 01/05/2018 17  17 - 63 U/L Final  . Alkaline Phosphatase 01/05/2018 105  38 - 126 U/L Final  . Total Bilirubin 01/05/2018 0.4  0.3 - 1.2 mg/dL Final  . GFR calc non Af Amer 01/05/2018 >60  >60 mL/min Final  . GFR calc Af Amer 01/05/2018 >60  >60 mL/min Final   Comment:  (NOTE) The eGFR has been calculated using the CKD EPI equation. This calculation has not been validated in all clinical situations. eGFR's persistently <60 mL/min signify possible Chronic Kidney Disease.   Marland Kitchen  Anion gap 01/05/2018 8  5 - 15 Final   Performed at [facility], [address] [place], [place] [address]  . WBC 01/05/2018 6.6  3.8 - 10.6 K/uL Final  . RBC 01/05/2018 3.26* 4.40 - 5.90 MIL/uL Final  . Hemoglobin 01/05/2018 10.6* 13.0 - 18.0 g/dL Final  . HCT 01/05/2018 30.2* 40.0 - 52.0 % Final  . MCV 01/05/2018 92.6  80.0 - 100.0 fL Final  . MCH 01/05/2018 32.5  26.0 - 34.0 pg Final  . MCHC 01/05/2018 35.1  32.0 - 36.0 g/dL Final  . RDW 01/05/2018 25.6* 11.5 - 14.5 % Final  . Platelets 01/05/2018 500* 150 - 440 K/uL Final  . Neutrophils Relative % 01/05/2018 60  % Final  . Neutro Abs 01/05/2018 4.0  1.4 - 6.5 K/uL Final  . Lymphocytes Relative 01/05/2018 21  % Final  . Lymphs Abs 01/05/2018 1.4  1.0 - 3.6 K/uL Final  . Monocytes Relative 01/05/2018 16  % Final  . Monocytes Absolute 01/05/2018 1.1* 0.2 - 1.0 K/uL Final  . Eosinophils Relative 01/05/2018 2  % Final  . Eosinophils Absolute 01/05/2018 0.1  0 - 0.7 K/uL Final  . Basophils Relative 01/05/2018 1  % Final  . Basophils Absolute 01/05/2018 0.1  0 - 0.1 K/uL Final   Performed at [facility], [address], [place], [place] [address]    Assessment:  Abdurahman Rugg is a 72 y.o. male with metastatic disease.  He has a recent history of muscle invasive bladder cancer and a distant history of lung cancer.  He has a history of stage IB lung cancer status post right upper lobe lobectomy in 09/2015.  Pathology revealed a T2aN0M0 adenocarcinoma.  Chest CT on 05/25/2017 revealed postoperative changes from a right upper lobe lobectomy without evidence of recurrence. There was new diffuse punctate sclerotic osseous lesions throughout the chest concerning for metastatic disease.  PET scan on 06/08/2017 at  the [facility] revealed right upper lobe lobectomy without evidence of recurrence. There was a new 3.2 cm mass along the right inferior bladder wall, anterior to the ureteral vesicular junction.  There were multiple new focal sclerotic and some probable mixed lytic and sclerotic osseous lesions in the axial skeleton many of which demonstrated increased FDG avidity. Differential diagnoses included include metastatic lung cancer, bladder cancer or prostate cancer. There was increased FDG uptake in the left medial thigh musculature of uncertain etiology.  PET scan on 09/09/2017 revealed several hypermetabolic mixed lytic and sclerotic osseous metastases throughout the axial skeleton.  There was suspected intramuscular soft tissue metastases in the right quadratus lumborum and posterior left lumbar paraspinal musculature.  There was mildly hypermetabolic retroperitoneal lymphadenopathy, probably representing nodal metastases.  There was diffuse irregular bladder wall thickening, asymmetrically prominent in the right bladder wall, suspicious for residual/recurrent bladder neoplasm.   There was nonspecific small ground-glass nodular opacity in the left upper lung lobe with low level metabolism.  There was no evidence of local tumor recurrence in the right lung status post right upper lobectomy   He underwent transurethral resection of bladder tumor (TURBT) with bilateral retrograde pyelogram on 08/12/2017.  He had a 3 cm bladder tumor on the right lateral wall with some sessile and papillary features.  Retrograde pyelograms were normal without filling defect.  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  He began monthly Xgeva on 09/16/2017 (last 12/22/2017).  He is s/p 3 cycles of carboplatin and gemcitabine (11/03/2017 - 12/15/2017).  Cycle #1 was complicated by orthostatic hypotension and fatigue requiring IVF and delay in day 8 gemcitabine (80% dose).  Cycle #2  was complicated by fecal impaction.  He did not receive gemcitabine of day 8 (WBC 1800 with an ANC of 1000).  Cycle #3 nadir platelet count was 23,000.   He has had issues with hypotension despite a history of hypertension.  He is off blood pressure medications.  Cortisol level was normal on 10/27/2017.  He has a normocytic anemia.  Work-up on 11/24/2017 revealed a normal ferritin, iron studies, folate.  B12 was 243.  He has a history of peripheral vascular disease s/p fem-fem bypass in 2016. He is on Plavix.  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  Colonoscopy was normal in 2011.  Symptomatically, he feels "not bad".  He has shortness of breath at times and continues to experience positional vertigo symptoms. Patient has opioid induced constipation that has improved with stool softeners BID and Miralax.  He denies any fever.  Pain is well controlled.  Exam is stable. WBC is 6600 with an [place] 4000.  Hemoglobin is 10.6, hematocrit 30.2, and platelets 500,000.  Calcium is 8.6.  Plan: 1.  Labs today:  CBC with diff, CMP, Mg. 2.  Begin day 1 of cycle #4 carboplatin and gemcitabine (80% dose). 3.  Continue calcium 1200 mg and vitamin D 800 IU daily.  4.  Schedule PET scan 01/23/2018. 5.  RTC in 1 week for labs (CBC with diff, CMP, Mg), +/- IVF, and day 8 of cycle #4 gemcitabine. 6.  RTC in 2 weeks for labs (CBC with diff, BMP), Xgeva, and +/- IVF. 7.  RTC in 3 weeks for MD assessment, labs (CBC with diff, CMP, Mg), review of scans, +/- IVFs, and day 1 of cycle #5 carboplatin + gemcitabine.   Honor Loh, NP  01/05/2018, 9:08 AM   I saw and evaluated the patient, participating in the key portions of the service and reviewing pertinent diagnostic studies and records.  I reviewed the nurse practitioner's note and agree with the findings and the plan.  The assessment and plan were discussed with the patient.  Several questions were asked by the patient and  answered.   Nolon Stalls, MD 01/05/2018,9:08 AM

## 2018-01-05 ENCOUNTER — Inpatient Hospital Stay: Payer: Medicare Other

## 2018-01-05 ENCOUNTER — Encounter: Payer: Self-pay | Admitting: Hematology and Oncology

## 2018-01-05 ENCOUNTER — Inpatient Hospital Stay (HOSPITAL_BASED_OUTPATIENT_CLINIC_OR_DEPARTMENT_OTHER): Payer: Medicare Other | Admitting: Hematology and Oncology

## 2018-01-05 VITALS — BP 113/79 | HR 97 | Temp 97.0°F | Resp 18 | Wt 128.0 lb

## 2018-01-05 DIAGNOSIS — Z79899 Other long term (current) drug therapy: Secondary | ICD-10-CM | POA: Diagnosis not present

## 2018-01-05 DIAGNOSIS — D649 Anemia, unspecified: Secondary | ICD-10-CM

## 2018-01-05 DIAGNOSIS — C672 Malignant neoplasm of lateral wall of bladder: Secondary | ICD-10-CM

## 2018-01-05 DIAGNOSIS — Z7189 Other specified counseling: Secondary | ICD-10-CM

## 2018-01-05 DIAGNOSIS — I739 Peripheral vascular disease, unspecified: Secondary | ICD-10-CM

## 2018-01-05 DIAGNOSIS — C7951 Secondary malignant neoplasm of bone: Secondary | ICD-10-CM

## 2018-01-05 DIAGNOSIS — I10 Essential (primary) hypertension: Secondary | ICD-10-CM | POA: Diagnosis not present

## 2018-01-05 DIAGNOSIS — Z808 Family history of malignant neoplasm of other organs or systems: Secondary | ICD-10-CM

## 2018-01-05 DIAGNOSIS — G893 Neoplasm related pain (acute) (chronic): Secondary | ICD-10-CM

## 2018-01-05 DIAGNOSIS — Z5111 Encounter for antineoplastic chemotherapy: Secondary | ICD-10-CM

## 2018-01-05 DIAGNOSIS — F17210 Nicotine dependence, cigarettes, uncomplicated: Secondary | ICD-10-CM

## 2018-01-05 DIAGNOSIS — Z85118 Personal history of other malignant neoplasm of bronchus and lung: Secondary | ICD-10-CM

## 2018-01-05 LAB — COMPREHENSIVE METABOLIC PANEL
ALT: 17 U/L (ref 17–63)
AST: 22 U/L (ref 15–41)
Albumin: 3.4 g/dL — ABNORMAL LOW (ref 3.5–5.0)
Alkaline Phosphatase: 105 U/L (ref 38–126)
Anion gap: 8 (ref 5–15)
BUN: 13 mg/dL (ref 6–20)
CO2: 20 mmol/L — ABNORMAL LOW (ref 22–32)
Calcium: 8.6 mg/dL — ABNORMAL LOW (ref 8.9–10.3)
Chloride: 102 mmol/L (ref 101–111)
Creatinine, Ser: 0.57 mg/dL — ABNORMAL LOW (ref 0.61–1.24)
GFR calc Af Amer: >60 mL/min (ref >60)
GFR calc non Af Amer: >60 mL/min (ref >60)
Glucose, Bld: 96 mg/dL (ref 65–99)
Potassium: 3.8 mmol/L (ref 3.5–5.1)
Sodium: 130 mmol/L — ABNORMAL LOW (ref 135–145)
Total Bilirubin: 0.4 mg/dL (ref 0.3–1.2)
Total Protein: 6.8 g/dL (ref 6.5–8.1)

## 2018-01-05 LAB — CBC WITH DIFFERENTIAL/PLATELET
Basophils Absolute: 0.1 10*3/uL (ref 0–0.1)
Basophils Relative: 1 %
Eosinophils Absolute: 0.1 10*3/uL (ref 0–0.7)
Eosinophils Relative: 2 %
HCT: 30.2 % — ABNORMAL LOW (ref 40.0–52.0)
Hemoglobin: 10.6 g/dL — ABNORMAL LOW (ref 13.0–18.0)
Lymphocytes Relative: 21 %
Lymphs Abs: 1.4 10*3/uL (ref 1.0–3.6)
MCH: 32.5 pg (ref 26.0–34.0)
MCHC: 35.1 g/dL (ref 32.0–36.0)
MCV: 92.6 fL (ref 80.0–100.0)
Monocytes Absolute: 1.1 10*3/uL — ABNORMAL HIGH (ref 0.2–1.0)
Monocytes Relative: 16 %
Neutro Abs: 4 10*3/uL (ref 1.4–6.5)
Neutrophils Relative %: 60 %
Platelets: 500 10*3/uL — ABNORMAL HIGH (ref 150–440)
RBC: 3.26 MIL/uL — ABNORMAL LOW (ref 4.40–5.90)
RDW: 25.6 % — ABNORMAL HIGH (ref 11.5–14.5)
WBC: 6.6 10*3/uL (ref 3.8–10.6)

## 2018-01-05 LAB — MAGNESIUM: Magnesium: 1.8 mg/dL (ref 1.7–2.4)

## 2018-01-05 MED ORDER — SODIUM CHLORIDE 0.9 % IV SOLN
1400.0000 mg | Freq: Once | INTRAVENOUS | Status: AC
  Administered 2018-01-05: 1400 mg via INTRAVENOUS
  Filled 2018-01-05: qty 26.3

## 2018-01-05 MED ORDER — PALONOSETRON HCL INJECTION 0.25 MG/5ML
0.2500 mg | Freq: Once | INTRAVENOUS | Status: AC
  Administered 2018-01-05: 0.25 mg via INTRAVENOUS
  Filled 2018-01-05: qty 5

## 2018-01-05 MED ORDER — CARBOPLATIN CHEMO INJECTION 450 MG/45ML
324.0000 mg | Freq: Once | INTRAVENOUS | Status: AC
  Administered 2018-01-05: 320 mg via INTRAVENOUS
  Filled 2018-01-05: qty 32

## 2018-01-05 MED ORDER — DEXAMETHASONE SODIUM PHOSPHATE 100 MG/10ML IJ SOLN: 10.0000 mg | Freq: Once | INTRAMUSCULAR | Status: DC

## 2018-01-05 MED ORDER — DEXAMETHASONE SODIUM PHOSPHATE 10 MG/ML IJ SOLN
10.0000 mg | Freq: Once | INTRAMUSCULAR | Status: AC
  Administered 2018-01-05: 10 mg via INTRAVENOUS
  Filled 2018-01-05: qty 1

## 2018-01-05 MED ORDER — HEPARIN SOD (PORK) LOCK FLUSH 100 UNIT/ML IV SOLN
500.0000 [IU] | Freq: Once | INTRAVENOUS | Status: AC | PRN
  Administered 2018-01-05: 500 [IU]
  Filled 2018-01-05: qty 5

## 2018-01-05 MED ORDER — SODIUM CHLORIDE 0.9 % IV SOLN
Freq: Once | INTRAVENOUS | Status: AC
  Administered 2018-01-05: 10:00:00 via INTRAVENOUS
  Filled 2018-01-05: qty 1000

## 2018-01-05 NOTE — Progress Notes (Signed)
Pt in today for follow up.  Denies any concerns or difficulties today.  Only using tylenol for pain. Other meds too constipating.

## 2018-01-05 NOTE — Progress Notes (Signed)
Nutrition Follow-up:  Patient with metastatic bladder cancer receiving chemotherapy.  Patient followed by Dr. Mike Gip.  Met with patient and wife during infusion this am.  Patient reports he is eating better.  Wife reports still needs to eat more.  Reports he continues to drink carnation instant breakfast daily. Does not really like other shakes.  Reports that he has been snacking on cheese its and peanut butter.  Ate mexican meal yesterday and steak recently.  Wanting sausage egg and cheese biscuit this am.  Wife very involved in care and trying ways to increase calories and protein.    No nutrition impact symptoms reported today    Medications: reviewed  Labs: reviewed  Anthropometrics:   Weight increased to 128 lb today from 123 lb 6.4 oz on 2/14   NUTRITION DIAGNOSIS: Malnutrition improving with weight gain   MALNUTRITION DIAGNOSIS: Severe malnutrition improving with weight gain   INTERVENTION:   Encouraged carnation instant breakfast daily. Patient not interested in adding ice cream to shake to increase calories and protein.  Reviewed strategies to increase calories and protein.  Patient verbalized understanding. Congratulated patient on weight gain.     MONITORING, EVALUATION, GOAL: weight trends, intake   NEXT VISIT: March 28 during infusion  Heinz Eckert B. Zenia Resides, [place], [place] Registered Dietitian [phone] (pager)

## 2018-01-10 ENCOUNTER — Emergency Department
Admission: EM | Admit: 2018-01-10 | Discharge: 2018-01-10 | Disposition: A | Discharge status: Home / Self Care | Payer: Non-veteran care | Attending: Emergency Medicine | Admitting: Emergency Medicine

## 2018-01-10 ENCOUNTER — Other Ambulatory Visit: Payer: Self-pay

## 2018-01-10 ENCOUNTER — Encounter: Payer: Self-pay | Admitting: Emergency Medicine

## 2018-01-10 ENCOUNTER — Other Ambulatory Visit: Payer: Self-pay | Admitting: Urgent Care

## 2018-01-10 ENCOUNTER — Telehealth: Payer: Self-pay | Admitting: *Deleted

## 2018-01-10 ENCOUNTER — Emergency Department: Payer: Non-veteran care

## 2018-01-10 DIAGNOSIS — Z79899 Other long term (current) drug therapy: Secondary | ICD-10-CM | POA: Diagnosis not present

## 2018-01-10 DIAGNOSIS — I10 Essential (primary) hypertension: Secondary | ICD-10-CM | POA: Insufficient documentation

## 2018-01-10 DIAGNOSIS — Z7982 Long term (current) use of aspirin: Secondary | ICD-10-CM | POA: Insufficient documentation

## 2018-01-10 DIAGNOSIS — Z950 Presence of cardiac pacemaker: Secondary | ICD-10-CM | POA: Diagnosis not present

## 2018-01-10 DIAGNOSIS — F17210 Nicotine dependence, cigarettes, uncomplicated: Secondary | ICD-10-CM | POA: Diagnosis not present

## 2018-01-10 DIAGNOSIS — C679 Malignant neoplasm of bladder, unspecified: Secondary | ICD-10-CM | POA: Insufficient documentation

## 2018-01-10 DIAGNOSIS — Z85118 Personal history of other malignant neoplasm of bronchus and lung: Secondary | ICD-10-CM | POA: Diagnosis not present

## 2018-01-10 DIAGNOSIS — K5900 Constipation, unspecified: Secondary | ICD-10-CM | POA: Insufficient documentation

## 2018-01-10 DIAGNOSIS — Z7902 Long term (current) use of antithrombotics/antiplatelets: Secondary | ICD-10-CM | POA: Diagnosis not present

## 2018-01-10 NOTE — Telephone Encounter (Signed)
Returned call to Patrick Hooper and she reports that patient is taking colace twice a day and Miralax daily.  Re discussed with B Pearline Cables NP who recommended Mag Citrate , Patrick Hooper states he drank almost a full bottle yesterday without any results. Again discussed with B Pearline Cables and he states patient has lactulose and he can restart that. Patrick Hooper states he has been taking that off and on twice a day. I advised that he can go to ER to be checked for impaction and she did not seem to want to do that stating "he has bowel sounds, it is rumbling" I advised that he should drink at least 8 8 oz glasses of water and to try warm prune juice and a cup of coffee. She asked about Linzess or an enema, I told her that Dr Mike Gip did not want to do Linzess at his time and that per B Pearline Cables, NP, did not want to introduce an enema cath up his rectum with his white count. She said well I guess we have done all we can.

## 2018-01-10 NOTE — Telephone Encounter (Signed)
Mardene Celeste called to report that patient has been constipated again for several days. Please advise

## 2018-01-10 NOTE — ED Triage Notes (Signed)
Pt from home for constipation. Last BM 4-5 days ago. Pt has taken Miramax, Lactulose, Dulcolax, and magcitrate without results. Pt being treated for Bladder CA and oncologist sent him here.

## 2018-01-10 NOTE — Discharge Instructions (Signed)
Continue the MiraLAX and/or other stool softeners that you are on, drink plenty of fluids, and eat leafy green vegetables.  Return to the emergency department for new, worsening, or persistent constipation, abdominal distention, sustained abdominal pain, nausea or vomiting, fevers, or any other new or worsening symptoms that concern you.

## 2018-01-10 NOTE — ED Notes (Signed)
Pt taken to POV in Wheelchair with wife. VSS. NAD. Discharge instructions, RX and follow up discussed. All questions answered.

## 2018-01-10 NOTE — Telephone Encounter (Signed)
He needs to be taking Colace BID, in addition to his Miralax. Adding fiber to his diet will always help as well. His issue is multifactorial. He has opioid induced constipation, and I would venture to say that he is not moving around or drinking enough.

## 2018-01-10 NOTE — ED Provider Notes (Signed)
[facility] Emergency Department Provider Note ____________________________________________   First MD Initiated Contact with Patient 01/10/18 [phone]     (approximate)  I have reviewed the triage vital signs and the nursing notes.   HISTORY  Chief Complaint Constipation    HPI Patrick Hooper is a 72 y.o. male with past medical history as noted below and currently undergoing treatment for bladder cancer (last chemo was 5 days ago) who presents with constipation, chronic for several months but with the current episode being the last 4-5 days that he has not had a bowel movement.  He states that it seems to be exacerbated by chemotherapy.  He is not on any narcotic pain medications at this time.  The patient states that the constipation is not been relieved by MiraLAX, Colace, or magnesium citrate which she took yesterday.  He has been passing gas.  He denies any rectal pain.  He reports some abdominal cramping yesterday but this has resolved, and he denies any nausea or vomiting.   Past Medical History:  Diagnosis Date  . Bladder cancer ([place])   . Hypertension   . Lung cancer ([place]) 2016  . PVD (peripheral vascular disease) [facility])     Patient Active Problem List   Diagnosis Date Noted  . Low vitamin B12 level 12/15/2017  . Leukopenia 12/01/2017  . Encounter for antineoplastic chemotherapy 11/03/2017  . Pressure injury of skin 10/13/2017  . Hypotension 10/13/2017  . Weakness 10/12/2017  . Cancer-related pain 09/27/2017  . 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 Goals of care, counseling/discussion 09/12/2017  . Malignant neoplasm of lateral wall of urinary bladder ([place]) 09/07/2017  . Malignant neoplasm of upper lobe of right lung ([place]) 09/07/2017  . Bone metastasis ([place]) 09/07/2017    Past Surgical History:  Procedure Laterality Date  . BLADDER SURGERY  08/12/2017  . BYPASS GRAFT FEMORAL/POPLITEAL W/VEIN  2009  . COLONOSCOPY  2013  .  CYSTOSCOPY  08/12/2017  . IR ABDOMINAL AORTOBIFEMORAL CATHETER SERIALOGRAM  2009  . LOBECTOMY Right 09/2015  . PACEMAKER INSERTION  2003  . PORTA CATH INSERTION N/A 11/21/2017   Procedure: PORTA CATH INSERTION;  Surgeon: Algernon Huxley, MD;  Location: [place] [facility];  Service: Cardiovascular;  Laterality: N/A;    Prior to Admission medications   Medication Sig Start Date End Date Taking? Authorizing Provider  aspirin EC 81 MG tablet Take 81 mg by mouth at bedtime.   Yes [provider]  calcium carbonate 1250 MG capsule Take 1,250 mg by mouth daily.   Yes [provider]  cholecalciferol (VITAMIN D) 400 units TABS tablet Take 800 Units by mouth daily.   Yes [provider]  clopidogrel (PLAVIX) 75 MG tablet Take 1 tablet (75 mg total) by mouth at bedtime. 12/07/17  Yes Elby Beck, FNP  docusate sodium (COLACE) 100 MG capsule Take 200 mg by mouth daily.    Yes [provider]  gabapentin (NEURONTIN) 100 MG capsule Take 1 capsule (100 mg total) by mouth at bedtime. 12/07/17  Yes Elby Beck, FNP  LORazepam (ATIVAN) 0.5 MG tablet Take 1 tablet (0.5 mg total) by mouth every 6 (six) hours as needed (Nausea or vomiting). Patient taking differently: Take 0.5 mg by mouth at bedtime.  11/03/17  Yes Corcoran, Drue Second, MD  polyethylene glycol (MIRALAX / GLYCOLAX) packet Take 17 g by mouth daily. Patient taking differently: Take 17 g by mouth daily as needed.  10/15/17  Yes Hyder,  Richard, MD  acetaminophen (TYLENOL) 500 MG tablet Take 500 mg by mouth every 6 (six) hours as needed for mild pain.     [provider]  ALPRAZolam Duanne Moron) 0.25 MG tablet Take 1 tablet (0.25 mg total) by mouth 2 (two) times daily as needed for anxiety. 12/07/17   Elby Beck, FNP  dexamethasone (DECADRON) 4 MG tablet Take 2 tablets (8 mg total) by mouth daily. Start the day after carboplatin chemotherapy for 2 days. 11/03/17   Lequita Asal, MD  lactulose  (CHRONULAC) 10 GM/15ML solution Take 45 mLs (30 g total) by mouth 2 (two) times daily as needed for mild constipation or severe constipation. Patient not taking: Reported on 01/05/2018 10/14/17   Loletha Grayer, MD  lidocaine-prilocaine (EMLA) cream Apply 1 application topically PRN 1 hour prior to procedure. Cover with press and seal until treatment. Patient not taking: Reported on 01/10/2018 11/24/17   [name], NP  megestrol (MEGACE ORAL) 40 MG/ML suspension Take 5 mLs (200 mg total) by mouth daily. Patient not taking: Reported on 01/10/2018 10/06/17   [name], NP  morphine (MS CONTIN) 15 MG 12 hr tablet Take 1 tablet (15 mg total) by mouth every 12 (twelve) hours. Patient not taking: Reported on 01/05/2018 11/11/17   [name], NP  ondansetron (ZOFRAN) 4 MG tablet Take 1 tablet (4 mg total) by mouth every 6 (six) hours as needed for nausea. Patient not taking: Reported on 01/10/2018 10/14/17   Loletha Grayer, MD  oxycodone (OXY-IR) 5 MG capsule Take 1 capsule (5 mg total) by mouth every 4 (four) hours as needed for pain. Patient not taking: Reported on 01/05/2018 10/24/17   [name], NP  potassium chloride SA (K-DUR,KLOR-CON) 20 MEQ tablet Take 1 tablet (20 mEq total) by mouth daily. Patient not taking: Reported on 01/10/2018 12/15/17   [name], NP  protein supplement shake (PREMIER PROTEIN) LIQD Take 325 mLs (11 oz total) by mouth 2 (two) times daily between meals. Patient not taking: Reported on 01/05/2018 10/14/17   Loletha Grayer, MD    Allergies Cefazolin; Carvedilol; Chlorthalidone; Hydralazine; and Hydrochlorothiazide  Family History  Problem Relation Age of Onset  . Cancer Mother   . Cancer Sister   . Cancer Maternal Grandmother     Social History Social History   Tobacco Use  . Smoking status: Current Every Day Smoker    Packs/day: 1.00    Years: 55.00    Pack years: 55.00    Types: Cigarettes  . Smokeless tobacco: Never Used  Substance Use Topics    . Alcohol use: Yes    Comment: Occasional beer  . Drug use: No    Review of Systems  Constitutional: No fever. Eyes: No redness. ENT: No sore throat. Cardiovascular: Denies chest pain. Respiratory: Denies shortness of breath. Gastrointestinal: No nausea, no vomiting.  Positive for constipation.  Genitourinary: Negative for dysuria.  Musculoskeletal: Negative for back pain. Skin: Negative for rash. Neurological: Negative for headache.   ____________________________________________   PHYSICAL EXAM:  VITAL SIGNS: ED Triage Vitals [01/10/18 1519]  Enc Vitals Group     BP 127/76     Pulse Rate (!) 113     Resp 20     Temp (!) 97.5 F (36.4 C)     Temp Source Oral     SpO2 100 %     Weight 128 lb (58.1 kg)     Height 5\' 11"  (1.803 m)     Head  Circumference      Peak Flow      Pain Score      Pain Loc      Pain Edu?      Excl. in [place]?     Constitutional: Alert and oriented. Well appearing and in no acute distress. Eyes: Conjunctivae are normal.  Head: Atraumatic. Nose: No congestion/rhinnorhea. Mouth/Throat: Mucous membranes are moist.   Neck: Normal range of motion.  Cardiovascular: Good peripheral circulation. Respiratory: Normal respiratory effort.   Gastrointestinal: Soft and nontender. No distention.  Genitourinary: No impacted stool present on DRE. Musculoskeletal:   Extremities warm and well perfused.  Neurologic:  Normal speech and language. No gross focal neurologic deficits are appreciated.  Skin:  Skin is warm and dry. No rash noted. Psychiatric: Mood and affect are normal. Speech and behavior are normal.  ____________________________________________   LABS (all labs ordered are listed, but only abnormal results are displayed)  Labs Reviewed - No data to display ____________________________________________  EKG   ____________________________________________  RADIOLOGY  Abd XR: No evidence of  obstruction ____________________________________________   PROCEDURES  Procedure(s) performed: No  Procedures  Critical Care performed: No ____________________________________________   INITIAL IMPRESSION / ASSESSMENT AND PLAN / ED COURSE  Pertinent labs & imaging results that were available during my care of the patient were reviewed by me and considered in my medical decision making (see chart for details).  59-year-old male with history of bladder cancer with last chemo 5 days ago and somewhat chronic constipation presents with constipation over the last 5 days.  It has persisted despite Colace, MiraLAX, and magnesium citrate administration.   I reviewed the past medical records in Epic and confirmed the history as stated above.  Per notes from telephone consultation with the patient's oncology NP today, his oncologist would like to avoid enema due to potential immunocompromise after chemotherapy.  On exam, the abdomen is soft and nontender, and I performed a DRE under sterile conditions and found no impacted stool in the rectum.  We will obtain an abdominal x-ray to evaluate for stool burden and consider further treatment for constipation with additional magnesium citrate or another medication.    ----------------------------------------- 5:03 PM on 01/10/2018 -----------------------------------------  Patient's x-ray does not show particularly large stool burden.  I think that the patient's constipation is likely multifactorial and related to his chemotherapy, as well as his decreased level of activity and decreased p.o. intake.  I feel that conservative management is appropriate this time.  I recommended the patient continue his MiraLAX and stool softeners as well as drinking plenty of water and adjusting his diet but that he did not need more aggressive interventions at this time.  I discussed return precautions particularly for SBO with the patient and he expressed  understanding.  He will follow-up with his oncologist.  ____________________________________________   FINAL CLINICAL IMPRESSION(S) / ED DIAGNOSES  Final diagnoses:  Constipation  Constipation, unspecified constipation type      NEW MEDICATIONS STARTED DURING THIS VISIT:  New Prescriptions   No medications on file     Note:  This document was prepared using Dragon voice recognition software and may include unintentional dictation errors.    Arta Silence, MD 01/10/18 1705

## 2018-01-12 ENCOUNTER — Inpatient Hospital Stay: Payer: Medicare Other

## 2018-01-12 VITALS — BP 138/77 | HR 79 | Temp 96.9°F | Resp 18

## 2018-01-12 DIAGNOSIS — Z5111 Encounter for antineoplastic chemotherapy: Secondary | ICD-10-CM | POA: Diagnosis not present

## 2018-01-12 DIAGNOSIS — C672 Malignant neoplasm of lateral wall of bladder: Secondary | ICD-10-CM

## 2018-01-12 LAB — CBC WITH DIFFERENTIAL/PLATELET
Basophils Absolute: 0 K/uL (ref 0–0.1)
Basophils Relative: 2 %
Eosinophils Absolute: 0 K/uL (ref 0–0.7)
Eosinophils Relative: 1 %
HCT: 28.5 % — ABNORMAL LOW (ref 40.0–52.0)
Hemoglobin: 10.3 g/dL — ABNORMAL LOW (ref 13.0–18.0)
Lymphocytes Relative: 41 %
Lymphs Abs: 1.2 K/uL (ref 1.0–3.6)
MCH: 33.7 pg (ref 26.0–34.0)
MCHC: 36.1 g/dL — ABNORMAL HIGH (ref 32.0–36.0)
MCV: 93.4 fL (ref 80.0–100.0)
Monocytes Absolute: 0.2 K/uL (ref 0.2–1.0)
Monocytes Relative: 6 %
Neutro Abs: 1.5 K/uL (ref 1.4–6.5)
Neutrophils Relative %: 50 %
Platelets: 325 K/uL (ref 150–440)
RBC: 3.05 MIL/uL — ABNORMAL LOW (ref 4.40–5.90)
RDW: 23 % — ABNORMAL HIGH (ref 11.5–14.5)
WBC: 2.9 K/uL — ABNORMAL LOW (ref 3.8–10.6)

## 2018-01-12 LAB — COMPREHENSIVE METABOLIC PANEL WITH GFR
ALT: 24 U/L (ref 17–63)
AST: 22 U/L (ref 15–41)
Albumin: 3.4 g/dL — ABNORMAL LOW (ref 3.5–5.0)
Alkaline Phosphatase: 100 U/L (ref 38–126)
Anion gap: 9 (ref 5–15)
BUN: 16 mg/dL (ref 6–20)
CO2: 23 mmol/L (ref 22–32)
Calcium: 8.8 mg/dL — ABNORMAL LOW (ref 8.9–10.3)
Chloride: 100 mmol/L — ABNORMAL LOW (ref 101–111)
Creatinine, Ser: 0.62 mg/dL (ref 0.61–1.24)
GFR calc Af Amer: >60 mL/min
GFR calc non Af Amer: >60 mL/min
Glucose, Bld: 102 mg/dL — ABNORMAL HIGH (ref 65–99)
Potassium: 3.3 mmol/L — ABNORMAL LOW (ref 3.5–5.1)
Sodium: 132 mmol/L — ABNORMAL LOW (ref 135–145)
Total Bilirubin: 0.4 mg/dL (ref 0.3–1.2)
Total Protein: 6.7 g/dL (ref 6.5–8.1)

## 2018-01-12 LAB — MAGNESIUM: Magnesium: 2 mg/dL (ref 1.7–2.4)

## 2018-01-12 MED ORDER — SODIUM CHLORIDE 0.9 % IV SOLN: 1400.0000 mg | Freq: Once | INTRAVENOUS | Status: DC

## 2018-01-12 MED ORDER — SODIUM CHLORIDE 0.9% FLUSH
10.0000 mL | INTRAVENOUS | Status: DC | PRN
  Administered 2018-01-12: 10 mL via INTRAVENOUS
  Filled 2018-01-12: qty 10

## 2018-01-12 MED ORDER — ONDANSETRON HCL 4 MG PO TABS
8.0000 mg | ORAL_TABLET | Freq: Once | ORAL | Status: AC
  Administered 2018-01-12: 8 mg via ORAL
  Filled 2018-01-12: qty 2

## 2018-01-12 MED ORDER — SODIUM CHLORIDE 0.9 % IV SOLN
Freq: Once | INTRAVENOUS | Status: AC
  Administered 2018-01-12: 09:00:00 via INTRAVENOUS
  Filled 2018-01-12: qty 1000

## 2018-01-12 MED ORDER — HEPARIN SOD (PORK) LOCK FLUSH 100 UNIT/ML IV SOLN: 500.0000 [IU] | Freq: Once | INTRAVENOUS | Status: DC | PRN

## 2018-01-12 MED ORDER — HEPARIN SOD (PORK) LOCK FLUSH 100 UNIT/ML IV SOLN
500.0000 [IU] | Freq: Once | INTRAVENOUS | Status: AC
  Administered 2018-01-12: 500 [IU] via INTRAVENOUS
  Filled 2018-01-12: qty 5

## 2018-01-12 MED ORDER — SODIUM CHLORIDE 0.9 % IV SOLN
1400.0000 mg | Freq: Once | INTRAVENOUS | Status: AC
  Administered 2018-01-12: 1400 mg via INTRAVENOUS
  Filled 2018-01-12: qty 26.3

## 2018-01-18 ENCOUNTER — Ambulatory Visit: Payer: Medicare Other | Admitting: Cardiovascular Disease

## 2018-01-19 ENCOUNTER — Telehealth: Payer: Self-pay | Admitting: *Deleted

## 2018-01-19 ENCOUNTER — Inpatient Hospital Stay: Payer: Medicare Other

## 2018-01-19 ENCOUNTER — Ambulatory Visit: Payer: Medicare Other

## 2018-01-19 VITALS — BP 123/85 | HR 83 | Temp 96.9°F | Resp 18

## 2018-01-19 DIAGNOSIS — C7951 Secondary malignant neoplasm of bone: Secondary | ICD-10-CM

## 2018-01-19 DIAGNOSIS — C672 Malignant neoplasm of lateral wall of bladder: Secondary | ICD-10-CM

## 2018-01-19 DIAGNOSIS — Z5111 Encounter for antineoplastic chemotherapy: Secondary | ICD-10-CM | POA: Diagnosis not present

## 2018-01-19 LAB — BASIC METABOLIC PANEL
Anion gap: 8 (ref 5–15)
BUN: 18 mg/dL (ref 6–20)
CO2: 21 mmol/L — ABNORMAL LOW (ref 22–32)
Calcium: 8.5 mg/dL — ABNORMAL LOW (ref 8.9–10.3)
Chloride: 104 mmol/L (ref 101–111)
Creatinine, Ser: 0.55 mg/dL — ABNORMAL LOW (ref 0.61–1.24)
GFR calc Af Amer: >60 mL/min (ref >60)
GFR calc non Af Amer: >60 mL/min (ref >60)
Glucose, Bld: 100 mg/dL — ABNORMAL HIGH (ref 65–99)
Potassium: 3.4 mmol/L — ABNORMAL LOW (ref 3.5–5.1)
Sodium: 133 mmol/L — ABNORMAL LOW (ref 135–145)

## 2018-01-19 LAB — CBC WITH DIFFERENTIAL/PLATELET
Basophils Absolute: 0 10*3/uL (ref 0–0.1)
Basophils Relative: 1 %
Eosinophils Absolute: 0 10*3/uL (ref 0–0.7)
Eosinophils Relative: 1 %
HCT: 27.2 % — ABNORMAL LOW (ref 40.0–52.0)
Hemoglobin: 9.5 g/dL — ABNORMAL LOW (ref 13.0–18.0)
Lymphocytes Relative: 40 %
Lymphs Abs: 1.1 10*3/uL (ref 1.0–3.6)
MCH: 33.5 pg (ref 26.0–34.0)
MCHC: 35 g/dL (ref 32.0–36.0)
MCV: 95.7 fL (ref 80.0–100.0)
Monocytes Absolute: 0.4 10*3/uL (ref 0.2–1.0)
Monocytes Relative: 14 %
Neutro Abs: 1.2 10*3/uL — ABNORMAL LOW (ref 1.4–6.5)
Neutrophils Relative %: 44 %
Platelets: 19 10*3/uL — CL (ref 150–400)
RBC: 2.85 MIL/uL — ABNORMAL LOW (ref 4.40–5.90)
RDW: 22.5 % — ABNORMAL HIGH (ref 11.5–14.5)
WBC: 2.7 10*3/uL — ABNORMAL LOW (ref 3.8–10.6)

## 2018-01-19 MED ORDER — HEPARIN SOD (PORK) LOCK FLUSH 100 UNIT/ML IV SOLN
500.0000 [IU] | Freq: Once | INTRAVENOUS | Status: AC | PRN
  Administered 2018-01-19: 500 [IU]
  Filled 2018-01-19: qty 5

## 2018-01-19 MED ORDER — DENOSUMAB 120 MG/1.7ML ~~LOC~~ SOLN
120.0000 mg | Freq: Once | SUBCUTANEOUS | Status: AC
  Administered 2018-01-19: 120 mg via SUBCUTANEOUS
  Filled 2018-01-19: qty 1.7

## 2018-01-19 MED ORDER — SODIUM CHLORIDE 0.9 % IV SOLN
Freq: Once | INTRAVENOUS | Status: AC
  Administered 2018-01-19: 11:00:00 via INTRAVENOUS
  Filled 2018-01-19: qty 1000

## 2018-01-19 NOTE — Telephone Encounter (Signed)
Patrick Hooper - Platelets 31 MD notified.

## 2018-01-20 ENCOUNTER — Telehealth: Payer: Self-pay | Admitting: Urgent Care

## 2018-01-20 ENCOUNTER — Other Ambulatory Visit: Payer: Self-pay | Admitting: Urgent Care

## 2018-01-20 NOTE — Telephone Encounter (Signed)
Called patient to inquire as to why he did not RTC today for labs. No answer. LMOM. Platelets low yesterday at 19,000. He was to return today for a recheck. If platelets continued to be low, he may require a platelet transfusion. I left message that orders would be entered for him to have this done at the hospital since the cancer center was closing at this time. I left my office number and asked that they return a call to let me know what the plan is regarding having the patient's blood rechecked.

## 2018-01-23 ENCOUNTER — Inpatient Hospital Stay: Payer: Medicare Other

## 2018-01-23 ENCOUNTER — Ambulatory Visit
Admission: RE | Admit: 2018-01-23 | Discharge: 2018-01-23 | Disposition: A | Discharge status: Home / Self Care | Payer: Non-veteran care | Source: Ambulatory Visit | Attending: Urgent Care | Admitting: Urgent Care

## 2018-01-23 DIAGNOSIS — R590 Localized enlarged lymph nodes: Secondary | ICD-10-CM | POA: Insufficient documentation

## 2018-01-23 DIAGNOSIS — C7951 Secondary malignant neoplasm of bone: Secondary | ICD-10-CM | POA: Diagnosis not present

## 2018-01-23 DIAGNOSIS — C672 Malignant neoplasm of lateral wall of bladder: Secondary | ICD-10-CM | POA: Insufficient documentation

## 2018-01-23 DIAGNOSIS — Z5111 Encounter for antineoplastic chemotherapy: Secondary | ICD-10-CM | POA: Diagnosis not present

## 2018-01-23 LAB — MAGNESIUM: Magnesium: 1.7 mg/dL (ref 1.7–2.4)

## 2018-01-23 LAB — COMPREHENSIVE METABOLIC PANEL
ALT: 19 U/L (ref 17–63)
AST: 20 U/L (ref 15–41)
Albumin: 3.4 g/dL — ABNORMAL LOW (ref 3.5–5.0)
Alkaline Phosphatase: 112 U/L (ref 38–126)
Anion gap: 8 (ref 5–15)
BUN: 12 mg/dL (ref 6–20)
CO2: 22 mmol/L (ref 22–32)
Calcium: 8.7 mg/dL — ABNORMAL LOW (ref 8.9–10.3)
Chloride: 103 mmol/L (ref 101–111)
Creatinine, Ser: 0.53 mg/dL — ABNORMAL LOW (ref 0.61–1.24)
GFR calc Af Amer: >60 mL/min (ref >60)
GFR calc non Af Amer: >60 mL/min (ref >60)
Glucose, Bld: 93 mg/dL (ref 65–99)
Potassium: 3.8 mmol/L (ref 3.5–5.1)
Sodium: 133 mmol/L — ABNORMAL LOW (ref 135–145)
Total Bilirubin: 0.4 mg/dL (ref 0.3–1.2)
Total Protein: 6.5 g/dL (ref 6.5–8.1)

## 2018-01-23 LAB — CBC WITH DIFFERENTIAL/PLATELET
Basophils Absolute: 0 10*3/uL (ref 0–0.1)
Basophils Relative: 1 %
Eosinophils Absolute: 0.1 10*3/uL (ref 0–0.7)
Eosinophils Relative: 1 %
HCT: 28.2 % — ABNORMAL LOW (ref 40.0–52.0)
Hemoglobin: 9.9 g/dL — ABNORMAL LOW (ref 13.0–18.0)
Lymphocytes Relative: 22 %
Lymphs Abs: 1.1 10*3/uL (ref 1.0–3.6)
MCH: 34.1 pg — ABNORMAL HIGH (ref 26.0–34.0)
MCHC: 34.9 g/dL (ref 32.0–36.0)
MCV: 97.6 fL (ref 80.0–100.0)
Monocytes Absolute: 1 10*3/uL (ref 0.2–1.0)
Monocytes Relative: 20 %
Neutro Abs: 2.9 10*3/uL (ref 1.4–6.5)
Neutrophils Relative %: 56 %
Platelets: 138 10*3/uL — ABNORMAL LOW (ref 150–440)
RBC: 2.89 MIL/uL — ABNORMAL LOW (ref 4.40–5.90)
RDW: 24.3 % — ABNORMAL HIGH (ref 11.5–14.5)
WBC: 5.2 10*3/uL (ref 3.8–10.6)

## 2018-01-23 LAB — GLUCOSE, CAPILLARY: Glucose-Capillary: 92 mg/dL (ref 65–99)

## 2018-01-23 MED ORDER — FLUDEOXYGLUCOSE F - 18 (FDG) INJECTION
6.6000 | Freq: Once | INTRAVENOUS | Status: AC | PRN
  Administered 2018-01-23: 6.81 via INTRAVENOUS

## 2018-01-25 ENCOUNTER — Other Ambulatory Visit: Payer: Self-pay | Admitting: *Deleted

## 2018-01-25 DIAGNOSIS — C672 Malignant neoplasm of lateral wall of bladder: Secondary | ICD-10-CM

## 2018-01-26 ENCOUNTER — Encounter: Payer: Self-pay | Admitting: Hematology and Oncology

## 2018-01-26 ENCOUNTER — Inpatient Hospital Stay (HOSPITAL_BASED_OUTPATIENT_CLINIC_OR_DEPARTMENT_OTHER): Payer: Medicare Other | Admitting: Hematology and Oncology

## 2018-01-26 ENCOUNTER — Inpatient Hospital Stay: Payer: Medicare Other

## 2018-01-26 VITALS — BP 118/80 | HR 103 | Temp 96.1°F | Resp 20 | Wt 125.3 lb

## 2018-01-26 DIAGNOSIS — C672 Malignant neoplasm of lateral wall of bladder: Secondary | ICD-10-CM

## 2018-01-26 DIAGNOSIS — F17210 Nicotine dependence, cigarettes, uncomplicated: Secondary | ICD-10-CM

## 2018-01-26 DIAGNOSIS — Z79899 Other long term (current) drug therapy: Secondary | ICD-10-CM | POA: Diagnosis not present

## 2018-01-26 DIAGNOSIS — I739 Peripheral vascular disease, unspecified: Secondary | ICD-10-CM | POA: Diagnosis not present

## 2018-01-26 DIAGNOSIS — R0602 Shortness of breath: Secondary | ICD-10-CM | POA: Diagnosis not present

## 2018-01-26 DIAGNOSIS — G893 Neoplasm related pain (acute) (chronic): Secondary | ICD-10-CM

## 2018-01-26 DIAGNOSIS — G4700 Insomnia, unspecified: Secondary | ICD-10-CM

## 2018-01-26 DIAGNOSIS — Z85118 Personal history of other malignant neoplasm of bronchus and lung: Secondary | ICD-10-CM

## 2018-01-26 DIAGNOSIS — I10 Essential (primary) hypertension: Secondary | ICD-10-CM | POA: Diagnosis not present

## 2018-01-26 DIAGNOSIS — Z808 Family history of malignant neoplasm of other organs or systems: Secondary | ICD-10-CM

## 2018-01-26 DIAGNOSIS — R42 Dizziness and giddiness: Secondary | ICD-10-CM | POA: Diagnosis not present

## 2018-01-26 DIAGNOSIS — Z7189 Other specified counseling: Secondary | ICD-10-CM

## 2018-01-26 DIAGNOSIS — C7951 Secondary malignant neoplasm of bone: Secondary | ICD-10-CM | POA: Diagnosis not present

## 2018-01-26 DIAGNOSIS — D649 Anemia, unspecified: Secondary | ICD-10-CM | POA: Diagnosis not present

## 2018-01-26 DIAGNOSIS — E876 Hypokalemia: Secondary | ICD-10-CM

## 2018-01-26 DIAGNOSIS — Z5111 Encounter for antineoplastic chemotherapy: Secondary | ICD-10-CM | POA: Diagnosis not present

## 2018-01-26 LAB — COMPREHENSIVE METABOLIC PANEL
ALT: 16 U/L — ABNORMAL LOW (ref 17–63)
AST: 22 U/L (ref 15–41)
Albumin: 3.2 g/dL — ABNORMAL LOW (ref 3.5–5.0)
Alkaline Phosphatase: 115 U/L (ref 38–126)
Anion gap: 7 (ref 5–15)
BUN: 12 mg/dL (ref 6–20)
CO2: 23 mmol/L (ref 22–32)
Calcium: 8.5 mg/dL — ABNORMAL LOW (ref 8.9–10.3)
Chloride: 101 mmol/L (ref 101–111)
Creatinine, Ser: 0.6 mg/dL — ABNORMAL LOW (ref 0.61–1.24)
GFR calc Af Amer: >60 mL/min (ref >60)
GFR calc non Af Amer: >60 mL/min (ref >60)
Glucose, Bld: 107 mg/dL — ABNORMAL HIGH (ref 65–99)
Potassium: 3.2 mmol/L — ABNORMAL LOW (ref 3.5–5.1)
Sodium: 131 mmol/L — ABNORMAL LOW (ref 135–145)
Total Bilirubin: 0.4 mg/dL (ref 0.3–1.2)
Total Protein: 6.8 g/dL (ref 6.5–8.1)

## 2018-01-26 LAB — CBC WITH DIFFERENTIAL/PLATELET
Basophils Absolute: 0.1 10*3/uL (ref 0–0.1)
Basophils Relative: 1 %
Eosinophils Absolute: 0.1 10*3/uL (ref 0–0.7)
Eosinophils Relative: 1 %
HCT: 27.6 % — ABNORMAL LOW (ref 40.0–52.0)
Hemoglobin: 9.6 g/dL — ABNORMAL LOW (ref 13.0–18.0)
Lymphocytes Relative: 16 %
Lymphs Abs: 1.1 10*3/uL (ref 1.0–3.6)
MCH: 34.3 pg — ABNORMAL HIGH (ref 26.0–34.0)
MCHC: 34.9 g/dL (ref 32.0–36.0)
MCV: 98 fL (ref 80.0–100.0)
Monocytes Absolute: 1.2 10*3/uL — ABNORMAL HIGH (ref 0.2–1.0)
Monocytes Relative: 17 %
Neutro Abs: 4.5 10*3/uL (ref 1.4–6.5)
Neutrophils Relative %: 65 %
Platelets: 452 10*3/uL — ABNORMAL HIGH (ref 150–440)
RBC: 2.82 MIL/uL — ABNORMAL LOW (ref 4.40–5.90)
RDW: 23.7 % — ABNORMAL HIGH (ref 11.5–14.5)
WBC: 7 10*3/uL (ref 3.8–10.6)

## 2018-01-26 LAB — MAGNESIUM: Magnesium: 1.7 mg/dL (ref 1.7–2.4)

## 2018-01-26 MED ORDER — SODIUM CHLORIDE 0.9 % IV SOLN
Freq: Once | INTRAVENOUS | Status: AC
  Administered 2018-01-26: 10:00:00 via INTRAVENOUS
  Filled 2018-01-26: qty 1000

## 2018-01-26 MED ORDER — CARBOPLATIN CHEMO INJECTION 450 MG/45ML
324.0000 mg | Freq: Once | INTRAVENOUS | Status: AC
  Administered 2018-01-26: 320 mg via INTRAVENOUS
  Filled 2018-01-26: qty 32

## 2018-01-26 MED ORDER — SODIUM CHLORIDE 0.9 % IV SOLN: 324.0000 mg | Freq: Once | INTRAVENOUS | Status: DC

## 2018-01-26 MED ORDER — PALONOSETRON HCL INJECTION 0.25 MG/5ML
0.2500 mg | Freq: Once | INTRAVENOUS | Status: AC
  Administered 2018-01-26: 0.25 mg via INTRAVENOUS
  Filled 2018-01-26: qty 5

## 2018-01-26 MED ORDER — HEPARIN SOD (PORK) LOCK FLUSH 100 UNIT/ML IV SOLN
500.0000 [IU] | Freq: Once | INTRAVENOUS | Status: AC | PRN
  Administered 2018-01-26: 500 [IU]
  Filled 2018-01-26: qty 5

## 2018-01-26 MED ORDER — DEXAMETHASONE SODIUM PHOSPHATE 10 MG/ML IJ SOLN
10.0000 mg | Freq: Once | INTRAMUSCULAR | Status: AC
  Administered 2018-01-26: 10 mg via INTRAVENOUS
  Filled 2018-01-26: qty 1

## 2018-01-26 MED ORDER — POTASSIUM CHLORIDE 20 MEQ/100ML IV SOLN
20.0000 meq | Freq: Once | INTRAVENOUS | Status: AC
  Administered 2018-01-26: 20 meq via INTRAVENOUS

## 2018-01-26 MED ORDER — POTASSIUM CHLORIDE CRYS ER 20 MEQ PO TBCR: 20.0000 meq | EXTENDED_RELEASE_TABLET | Freq: Every day | ORAL | 1 refills | Status: AC

## 2018-01-26 MED ORDER — SODIUM CHLORIDE 0.9 % IV SOLN
1400.0000 mg | Freq: Once | INTRAVENOUS | Status: AC
  Administered 2018-01-26: 1400 mg via INTRAVENOUS
  Filled 2018-01-26: qty 26.3

## 2018-01-26 MED ORDER — SODIUM CHLORIDE 0.9 % IV SOLN: 20.0000 meq | Freq: Once | INTRAVENOUS | Status: DC

## 2018-01-26 MED ORDER — SODIUM CHLORIDE 0.9 % IV SOLN: 10.0000 mg | Freq: Once | INTRAVENOUS | Status: DC

## 2018-01-26 NOTE — Progress Notes (Signed)
Nutrition Follow-up:  Patient with metastatic bladder cancer receiving chemotherapy.    Met with patient and wife during infusion.  Patient reports appetite has not been that good the last few weeks.  Has not been drinking carnation instant breakfast shakes. Does not like any of the other shakes.  Reports some shortness of breath that effects appetite.  Reports bowels are moving, no nausea.   Wife tearful at times during visit.  Medications: megace but not taking.  Thinks he may start taking it again  Labs: reviewed  Anthropometrics:   Weight decreased to 125 lb today from 128 lb but increased from 120 lb on initial assessment   NUTRITION DIAGNOSIS: Malnutrition stable   MALNUTRITION DIAGNOSIS: Severe malnutrition, ongoing   INTERVENTION:  Encouraged patient to restart carnation instant breakfast Patient to consider restarting appetite stimulant Encouraged small frequent high calorie, high protein foods    MONITORING, EVALUATION, GOAL: weight trends intake   NEXT VISIT: April 18 during infusion  Quinn Quam B. Zenia Resides, [place], [place] Registered Dietitian [phone] (pager)

## 2018-01-26 NOTE — Progress Notes (Signed)
[facility] day:  01/26/2018   Chief Complaint: Patrick Hooper is a 72 y.o. male with metastatic bladder cancer who is seen for review of interval scans and assessment prior to day 1 of cycle #5 of carboplatin and gemcitabine.  HPI:  The patient was last seen in the medical oncology clinic on 01/05/2018.  At that time, he felt "not bad".  He had shortness of breath at times and positional vertigo symptoms. Patient had opioid induced constipation that had improved with stool softeners BID and Miralax.  He denied any fever.  Pain was well controlled.  Exam was stable. WBC was 6600 with an [place] 4000.  Hemoglobin was 10.6, hematocrit 30.2, and platelets 500,000.  Calcium was 8.6.  He began cycle #4 carboplatin and gemcitabine.  CBC on 01/19/2018 revealed a hematocrit of 27.2, hemoglobin 9.5, MCV 95.7, platelets 19,000, WBC 2700 with an ANC of 1200.  He received Xgeva on 01/19/2018.  Patient has seen cardiology since he was last seen in clinic. Patient notes that he was advised that "he had the heart of a young man". Patient was prescribed Albuterol MDI for his intermittent shortness of breath.  PET scan on 01/23/2018 revealed a mixed but generally positive response to therapy, most typified by decreased hypermetabolism throughout the numerous musculoskeletal metastases. While there were several new and enlarged sclerotic osseous lesions, most of these demonstrated no hypermetabolism, presumably treated metastatic lesions.  There was nonenlarged but mildly hypermetabolic right level 5B lymph node in the lower right cervical region. There were several other nonenlarged non hypermetabolic lymph nodes  adjacent to this. This was of uncertain etiology and significance, but attention on follow-up studies is recommended.  During the interim, patient is doing pretty good. He denies any acute symptoms. Patient receives IV fluids after his chemo therapy, which he  notes is "helping a lot". Patient continues to have intermittent episodes of shortness of breath and vertiginous symptoms. His dizziness is exacerbated by position changes. He denies nausea and vomiting. Patient is eating "ok". His weight is down 3 pounds.   Patient denies significant pain in the clinic today. He has pain to LEFT trapezius area. Patient continues to smoke. He states, "I smoke everyday".    Past Medical History:  Diagnosis Date  . Bladder cancer ([place])   . Hypertension   . Lung cancer ([place]) 2016  . PVD (peripheral vascular disease) ([place])     Past Surgical History:  Procedure Laterality Date  . BLADDER SURGERY  08/12/2017  . BYPASS GRAFT FEMORAL/POPLITEAL W/VEIN  2009  . COLONOSCOPY  2013  . CYSTOSCOPY  08/12/2017  . IR ABDOMINAL AORTOBIFEMORAL CATHETER SERIALOGRAM  2009  . LOBECTOMY Right 09/2015  . PACEMAKER INSERTION  2003  . PORTA CATH INSERTION N/A 11/21/2017   Procedure: PORTA CATH INSERTION;  Surgeon: Algernon Huxley, MD;  Location: [facility];  Service: Cardiovascular;  Laterality: N/A;    Family History  Problem Relation Age of Onset  . Cancer Mother   . Cancer Sister   . Cancer Maternal Grandmother     Social History:  reports that he has been smoking cigarettes.  He has a 55.00 pack-year smoking history. He has never used smokeless tobacco. He reports that he drinks alcohol. He reports that he does not use drugs.  He smokes 1 pack a day for > 55 years.  He used to drink 4 alcoholic beverages/day.  Now his alcohol intake is infrequent. 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 There is the potential of past exposure to radiation and/or toxins. He is registered with the [place] regarding his exposures.  He lives in [place].  The patient is accompanied by his girlfriend Optometrist) today.  Allergies:  Allergies  Allergen Reactions  . Cefazolin Rash and Shortness Of Breath  . Carvedilol Other (See  Comments)    fatigue  . Chlorthalidone Other (See Comments)    Other reaction(s): Unknown  . Hydralazine     Other reaction(s): Unknown  . Hydrochlorothiazide Other (See Comments)    Current Medications: Current Outpatient Medications  Medication Sig Dispense Refill  . acetaminophen (TYLENOL) 500 MG tablet Take 500 mg by mouth every 6 (six) hours as needed for mild pain.     Marland Kitchen albuterol (PROVENTIL HFA) 108 (90 Base) MCG/ACT inhaler Inhale 2 puffs into the lungs every 6 (six) hours as needed.    . ALPRAZolam (XANAX) 0.25 MG tablet Take 1 tablet (0.25 mg total) by mouth 2 (two) times daily as needed for anxiety. 30 tablet 0  . aspirin EC 81 MG tablet Take 81 mg by mouth at bedtime.    . calcium carbonate 1250 MG capsule Take 1,250 mg by mouth daily.    . cholecalciferol (VITAMIN D) 400 units TABS tablet Take 800 Units by mouth daily.    . clopidogrel (PLAVIX) 75 MG tablet Take 1 tablet (75 mg total) by mouth at bedtime. 90 tablet 1  . dexamethasone (DECADRON) 4 MG tablet Take 2 tablets (8 mg total) by mouth daily. Start the day after carboplatin chemotherapy for 2 days. 30 tablet 1  . docusate sodium (COLACE) 100 MG capsule Take 200 mg by mouth daily.     Marland Kitchen gabapentin (NEURONTIN) 100 MG capsule Take 1 capsule (100 mg total) by mouth at bedtime. 30 capsule 3  . LORazepam (ATIVAN) 0.5 MG tablet Take 1 tablet (0.5 mg total) by mouth every 6 (six) hours as needed (Nausea or vomiting). (Patient taking differently: Take 0.5 mg by mouth at bedtime. ) 30 tablet 0  . polyethylene glycol (MIRALAX / GLYCOLAX) packet Take 17 g by mouth daily. (Patient taking differently: Take 17 g by mouth daily as needed. ) 30 each 0  . lactulose (CHRONULAC) 10 GM/15ML solution Take 45 mLs (30 g total) by mouth 2 (two) times daily as needed for mild constipation or severe constipation. (Patient not taking: Reported on 01/05/2018) 1892 mL 0  . lidocaine-prilocaine (EMLA) cream Apply 1 application topically PRN 1 hour prior  to procedure. Cover with press and seal until treatment. (Patient not taking: Reported on 01/10/2018) 30 g 0  . megestrol (MEGACE ORAL) 40 MG/ML suspension Take 5 mLs (200 mg total) by mouth daily. (Patient not taking: Reported on 01/10/2018) 240 mL 0  . morphine (MS CONTIN) 15 MG 12 hr tablet Take 1 tablet (15 mg total) by mouth every 12 (twelve) hours. (Patient not taking: Reported on 01/05/2018) 30 tablet 0  . ondansetron (ZOFRAN) 4 MG tablet Take 1 tablet (4 mg total) by mouth every 6 (six) hours as needed for nausea. (Patient not taking: Reported on 01/10/2018) 20 tablet 0  . oxycodone (OXY-IR) 5 MG capsule Take 1 capsule (5 mg total) by mouth every 4 (four) hours as needed for pain. (Patient not taking: Reported on 01/05/2018) 30 capsule 0  . potassium chloride SA (K-DUR,KLOR-CON) 20 MEQ tablet Take 1 tablet (20 mEq total) by mouth daily. (Patient not taking:  Reported on 01/10/2018) 90 tablet 1  . protein supplement shake (PREMIER PROTEIN) LIQD Take 325 mLs (11 oz total) by mouth 2 (two) times daily between meals. (Patient not taking: Reported on 01/05/2018) 60 Can 0   No current facility-administered medications for this visit.    Facility-Administered Medications Ordered in Other Visits  Medication Dose Route Frequency Provider Last Rate Last Dose  . 0.9 %  sodium chloride infusion   Intravenous Continuous Verlon Au, NP 500 mL/hr at 10/31/17 1400      Review of Systems:  GENERAL:  Feels "good".  No fever or sweats.  Weight down 3 pounds since last visit.  PERFORMANCE STATUS (ECOG):  2 HEENT:  No visual changes, sore throat, mouth sores or tenderness. Lungs: Shortness of breath with exertion.  Cough.  No hemoptysis. Cardiac:  Implanted pacemaker (NOT MRI COMPATIBLE). No chest pain, palpitations, orthopnea, or PND.  Blood pressure fluctuates.  Off amlodipine. GI:  Constipation.  Taking stool softener BID and Miralax q day.  No nausea, vomiting, diarrhea, melena or hematochezia. GU:   Dribbling.  Urgency, frequency, dysuria and nocturia.  No hematuria. Musculoskeletal:  LEFT trapezius pain.  Chronic bone pain (lower ribs, shoulders, back, neck, shoulder), well controlled.  Joint pain (left wrist and right elbow). No muscle tenderness. Extremities:  No pain or swelling. Skin:  No rashes or skin changes. Neuro: Vertigo (see HPI).  No headache.  4th digit of left hand change in sensation.  No numbness or weakness, balance or coordination issues. Endocrine:  No diabetes, thyroid issues, hot flashes or night sweats. Psych:  No mood changes, depression or anxiety. Pain:  No focal pain today.   Review of systems:  All other systems reviewed and found to be negative.  Physical Exam: Blood pressure 118/80, pulse (!) 103, temperature (!) 96.1 F (35.6 C), temperature source Tympanic, resp. rate 20, weight 125 lb 5 oz (56.8 kg), SpO2 100 %. GENERAL:  Thin gentleman sitting comfortably in a wheelchair in the exam room in no acute distress. MENTAL STATUS:  Alert and oriented to person, place and time. HEAD:  Thin gray hair.  Normocephalic, atraumatic, face symmetric, no Cushingoid features. EYES:  Blue eyes.  Pupils equal round and reactive to light and accomodation.  No conjunctivitis or scleral icterus. ENT:  Oropharynx clear without lesion.  Tongue normal.  Upper dentures.  No lower teeth.  Mucous membranes moist.  CHEST:  Left sided pacemaker. RESPIRATORY:  Clear to auscultation without rales, wheezes or rhonchi. CARDIOVASCULAR:  Regular rate and rhythm without murmur, rub or gallop. ABDOMEN:  Soft, non-tender, with active bowel sounds, and no hepatosplenomegaly.  No masses. SKIN:  No rashes, ulcers or lesions. EXTREMITIES: Nicotine stained nails.  No edema, no skin discoloration or tenderness.  No palpable cords. LYMPH NODES: No palpable cervical, supraclavicular, axillary or inguinal adenopathy  NEUROLOGICAL: Unremarkable. PSYCH:  Appropriate.   Appointment on 01/26/2018   Component Date Value Ref Range Status  . Magnesium 01/26/2018 1.7  1.7 - 2.4 mg/dL Final   Performed at [facility], [address] [place], [place] [address]  . Sodium 01/26/2018 131* 135 - 145 mmol/L Final  . Potassium 01/26/2018 3.2* 3.5 - 5.1 mmol/L Final  . Chloride 01/26/2018 101  101 - 111 mmol/L Final  . CO2 01/26/2018 23  22 - 32 mmol/L Final  . Glucose, Bld 01/26/2018 107* 65 - 99 mg/dL Final  . BUN 01/26/2018 12  6 - 20 mg/dL Final  . Creatinine, Ser 01/26/2018 0.60*  0.61 - 1.24 mg/dL Final  . Calcium 01/26/2018 8.5* 8.9 - 10.3 mg/dL Final  . Total Protein 01/26/2018 6.8  6.5 - 8.1 g/dL Final  . Albumin 01/26/2018 3.2* 3.5 - 5.0 g/dL Final  . AST 01/26/2018 22  15 - 41 U/L Final  . ALT 01/26/2018 16* 17 - 63 U/L Final  . Alkaline Phosphatase 01/26/2018 115  38 - 126 U/L Final  . Total Bilirubin 01/26/2018 0.4  0.3 - 1.2 mg/dL Final  . GFR calc non Af Amer 01/26/2018 >60  >60 mL/min Final  . GFR calc Af Amer 01/26/2018 >60  >60 mL/min Final   Comment: (NOTE) The eGFR has been calculated using the CKD EPI equation. This calculation has not been validated in all clinical situations. eGFR's persistently <60 mL/min signify possible Chronic Kidney Disease.   Georgiann Hahn gap 01/26/2018 7  5 - 15 Final   Performed at [facility], [address] [place], [place] [address]  . WBC 01/26/2018 7.0  3.8 - 10.6 K/uL Final  . RBC 01/26/2018 2.82* 4.40 - 5.90 MIL/uL Final  . Hemoglobin 01/26/2018 9.6* 13.0 - 18.0 g/dL Final  . HCT 01/26/2018 27.6* 40.0 - 52.0 % Final  . MCV 01/26/2018 98.0  80.0 - 100.0 fL Final  . MCH 01/26/2018 34.3* 26.0 - 34.0 pg Final  . MCHC 01/26/2018 34.9  32.0 - 36.0 g/dL Final  . RDW 01/26/2018 23.7* 11.5 - 14.5 % Final  . Platelets 01/26/2018 452* 150 - 440 K/uL Final  . Neutrophils Relative % 01/26/2018 65  % Final  . Neutro Abs 01/26/2018 4.5  1.4 - 6.5 K/uL Final  . Lymphocytes Relative 01/26/2018 16  % Final  . Lymphs Abs 01/26/2018 1.1   1.0 - 3.6 K/uL Final  . Monocytes Relative 01/26/2018 17  % Final  . Monocytes Absolute 01/26/2018 1.2* 0.2 - 1.0 K/uL Final  . Eosinophils Relative 01/26/2018 1  % Final  . Eosinophils Absolute 01/26/2018 0.1  0 - 0.7 K/uL Final  . Basophils Relative 01/26/2018 1  % Final  . Basophils Absolute 01/26/2018 0.1  0 - 0.1 K/uL Final   Performed at [facility], [address] [place], [place] [address]    Assessment:  Haygen Zebrowski is a 72 y.o. male with metastatic disease.  He has a recent history of muscle invasive bladder cancer and a distant history of lung cancer.  He has a history of stage IB lung cancer status post right upper lobe lobectomy in 09/2015.  Pathology revealed a T2aN0M0 adenocarcinoma.  Chest CT on 05/25/2017 revealed postoperative changes from a right upper lobe lobectomy without evidence of recurrence. There was new diffuse punctate sclerotic osseous lesions throughout the chest concerning for metastatic disease.  [facility] scan on 06/08/2017 at [facility] revealed right upper lobe lobectomy without evidence of recurrence. There was a new 3.2 cm mass along the right inferior bladder wall, anterior to the ureteral vesicular junction.  There were multiple new focal sclerotic and some probable mixed lytic and sclerotic osseous lesions in the axial skeleton many of which demonstrated increased FDG avidity. Differential diagnoses included include metastatic lung cancer, bladder cancer or prostate cancer. There was increased FDG uptake in the left medial thigh musculature of uncertain etiology.  PET scan on 09/09/2017 revealed several hypermetabolic mixed lytic and sclerotic osseous metastases throughout the axial skeleton.  There was suspected intramuscular soft tissue metastases in the right quadratus lumborum and posterior left lumbar paraspinal musculature.  There was  mildly hypermetabolic retroperitoneal lymphadenopathy, probably representing nodal metastases.  There  was diffuse irregular bladder wall thickening, asymmetrically prominent in the right bladder wall, suspicious for residual/recurrent bladder neoplasm.   There was nonspecific small ground-glass nodular opacity in the left upper lung lobe with low level metabolism.  There was no evidence of local tumor recurrence in the right lung status post right upper lobectomy   He underwent transurethral resection of bladder tumor (TURBT) with bilateral retrograde pyelogram on 08/12/2017.  He had a 3 cm bladder tumor on the right lateral wall with some sessile and papillary features.  Retrograde pyelograms were normal without filling defect.  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  He began monthly Xgeva on 09/16/2017 (last 01/19/2018).  He is s/p 4 cycles of carboplatin and gemcitabine (11/03/2017 - 01/05/2018).  Cycle #1 was complicated by orthostatic hypotension and fatigue requiring IVF and delay in day 8 gemcitabine (80% dose).  Cycle #2 was complicated by fecal impaction.  He did not receive gemcitabine of day 8 (WBC 1800 with an ANC of 1000).  Cycle #3 nadir platelet count was 23,000.   PET scan on 01/23/2018 revealed a mixed but generally positive response to therapy, most typified by decreased hypermetabolism throughout the numerous musculoskeletal metastases. While there were several new and enlarged sclerotic osseous lesions, most of these demonstrated no hypermetabolism, presumably treated metastatic lesions.  There was nonenlarged but mildly hypermetabolic right level 5B lymph node in the lower right cervical region. There were several other nonenlarged non hypermetabolic lymph nodes  adjacent to this area., uncertain etiology and significance.  He has had issues with hypotension despite a history of hypertension.  He is off blood pressure medications.  Cortisol level was normal on 10/27/2017.  He has a normocytic anemia.  Work-up on 11/24/2017 revealed  a normal ferritin, iron studies, folate.  B12 was 243.  He has a history of peripheral vascular disease s/p fem-fem bypass in 2016. He is on Plavix.  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  Colonoscopy was normal in 2011.  Symptomatically, he feels "good".  He has shortness of breath at times and continues to experience positional vertigo symptoms. Patient has opioid induced constipation that has improved with stool softeners BID and Miralax.  He denies any fever.  Pain is well controlled.  Exam is stable. WBC is 7000 with an [place] 4500.  Hemoglobin is 9.6, hematocrit 27.6, and platelets 452,000.  Calcium is 8.5. Sodium 134 (low). Potassium 3.2 (low).   Plan: 1.  Labs today:  CBC with diff, CMP, Mg. 2.  Review interval PET scan.  Discuss predominant improvement.  Discuss interval of time between initial scan and initiation of treatment.  Patient clinically improved since initiation of treatment.  Review scans at tumor board on 01/26/2018. 3.  Discuss electrolyte imbalances. Na and K+ low. Encouraged ORT (Gatorade and Pedialyte). Will replace with 20 mEq IV potassium today. Patient to restart oral potassium at home.  4.  Continue calcium 1200 mg and vitamin D 800 IU daily.  5.  Discuss smoking cessation. Patient smokes daily and has no intentions of stopping despite education.  6.  Discuss insomnia. Asking for BZO (alprazolam). Suggested that patient try melatonin first.  7.  Discuss patient at tumor board today.  8.  Begin day 1 of cycle #5 carboplatin and gemcitabine (80% dose). 9.  RTC in 1 week for labs (CBC with diff, CMP, Mg), +/- IVF, and day  8 of cycle #5 gemcitabine. 10.  RTC in 2 weeks for labs (CBC with diff, BMP), and +/- IVF. 11.  RTC in 3 weeks for MD assessment, labs (CBC with diff, CMP, Mg), Xgeva, +/- IVFs, and day 1 of cycle #6 carboplatin + gemcitabine.   Honor Loh, NP  01/26/2018, 9:36 AM   I saw and evaluated the patient, participating in the  key portions of the service and reviewing pertinent diagnostic studies and records.  I reviewed the nurse practitioner's note and agree with the findings and the plan.  The assessment and plan were discussed with the patient.  Several questions were asked by the patient and answered.   Nolon Stalls, MD 01/26/2018,9:36 AM

## 2018-01-26 NOTE — Progress Notes (Signed)
Patient states his cardiologist started him on an albuterol inhaler.  C/O sob, more with exertion.  Appetite is not quite so good.

## 2018-01-28 DIAGNOSIS — G4700 Insomnia, unspecified: Secondary | ICD-10-CM | POA: Insufficient documentation

## 2018-01-28 DIAGNOSIS — E876 Hypokalemia: Secondary | ICD-10-CM | POA: Insufficient documentation

## 2018-02-02 ENCOUNTER — Inpatient Hospital Stay: Payer: Non-veteran care | Attending: Hematology and Oncology

## 2018-02-02 ENCOUNTER — Other Ambulatory Visit: Payer: Self-pay | Admitting: Hematology and Oncology

## 2018-02-02 ENCOUNTER — Inpatient Hospital Stay: Payer: Non-veteran care

## 2018-02-02 VITALS — BP 117/74 | HR 96 | Temp 96.0°F | Resp 18 | Wt 125.0 lb

## 2018-02-02 DIAGNOSIS — R0602 Shortness of breath: Secondary | ICD-10-CM | POA: Insufficient documentation

## 2018-02-02 DIAGNOSIS — I739 Peripheral vascular disease, unspecified: Secondary | ICD-10-CM | POA: Insufficient documentation

## 2018-02-02 DIAGNOSIS — K5900 Constipation, unspecified: Secondary | ICD-10-CM | POA: Diagnosis not present

## 2018-02-02 DIAGNOSIS — F17210 Nicotine dependence, cigarettes, uncomplicated: Secondary | ICD-10-CM | POA: Insufficient documentation

## 2018-02-02 DIAGNOSIS — Z7982 Long term (current) use of aspirin: Secondary | ICD-10-CM | POA: Insufficient documentation

## 2018-02-02 DIAGNOSIS — D649 Anemia, unspecified: Secondary | ICD-10-CM | POA: Diagnosis not present

## 2018-02-02 DIAGNOSIS — C672 Malignant neoplasm of lateral wall of bladder: Secondary | ICD-10-CM

## 2018-02-02 DIAGNOSIS — H8110 Benign paroxysmal vertigo, unspecified ear: Secondary | ICD-10-CM | POA: Diagnosis not present

## 2018-02-02 DIAGNOSIS — I10 Essential (primary) hypertension: Secondary | ICD-10-CM | POA: Diagnosis not present

## 2018-02-02 DIAGNOSIS — R590 Localized enlarged lymph nodes: Secondary | ICD-10-CM | POA: Insufficient documentation

## 2018-02-02 DIAGNOSIS — Z5111 Encounter for antineoplastic chemotherapy: Secondary | ICD-10-CM | POA: Diagnosis present

## 2018-02-02 DIAGNOSIS — Z79899 Other long term (current) drug therapy: Secondary | ICD-10-CM | POA: Diagnosis not present

## 2018-02-02 DIAGNOSIS — Z85118 Personal history of other malignant neoplasm of bronchus and lung: Secondary | ICD-10-CM | POA: Insufficient documentation

## 2018-02-02 DIAGNOSIS — C7951 Secondary malignant neoplasm of bone: Secondary | ICD-10-CM | POA: Insufficient documentation

## 2018-02-02 DIAGNOSIS — R001 Bradycardia, unspecified: Secondary | ICD-10-CM | POA: Diagnosis not present

## 2018-02-02 DIAGNOSIS — R918 Other nonspecific abnormal finding of lung field: Secondary | ICD-10-CM | POA: Insufficient documentation

## 2018-02-02 LAB — CBC WITH DIFFERENTIAL/PLATELET
Basophils Absolute: 0 10*3/uL (ref 0–0.1)
Basophils Relative: 1 %
Eosinophils Absolute: 0 10*3/uL (ref 0–0.7)
Eosinophils Relative: 1 %
HCT: 26.1 % — ABNORMAL LOW (ref 40.0–52.0)
Hemoglobin: 9.3 g/dL — ABNORMAL LOW (ref 13.0–18.0)
Lymphocytes Relative: 30 %
Lymphs Abs: 1 10*3/uL (ref 1.0–3.6)
MCH: 34.5 pg — ABNORMAL HIGH (ref 26.0–34.0)
MCHC: 35.6 g/dL (ref 32.0–36.0)
MCV: 96.9 fL (ref 80.0–100.0)
Monocytes Absolute: 0.3 10*3/uL (ref 0.2–1.0)
Monocytes Relative: 10 %
Neutro Abs: 1.9 10*3/uL (ref 1.4–6.5)
Neutrophils Relative %: 58 %
Platelets: 329 10*3/uL (ref 150–440)
RBC: 2.7 MIL/uL — ABNORMAL LOW (ref 4.40–5.90)
RDW: 20.6 % — ABNORMAL HIGH (ref 11.5–14.5)
WBC: 3.3 10*3/uL — ABNORMAL LOW (ref 3.8–10.6)

## 2018-02-02 LAB — COMPREHENSIVE METABOLIC PANEL
ALT: 43 U/L (ref 17–63)
AST: 27 U/L (ref 15–41)
Albumin: 3.4 g/dL — ABNORMAL LOW (ref 3.5–5.0)
Alkaline Phosphatase: 107 U/L (ref 38–126)
Anion gap: 8 (ref 5–15)
BUN: 15 mg/dL (ref 6–20)
CO2: 23 mmol/L (ref 22–32)
Calcium: 8.8 mg/dL — ABNORMAL LOW (ref 8.9–10.3)
Chloride: 100 mmol/L — ABNORMAL LOW (ref 101–111)
Creatinine, Ser: 0.62 mg/dL (ref 0.61–1.24)
GFR calc Af Amer: >60 mL/min (ref >60)
GFR calc non Af Amer: >60 mL/min (ref >60)
Glucose, Bld: 105 mg/dL — ABNORMAL HIGH (ref 65–99)
Potassium: 3.6 mmol/L (ref 3.5–5.1)
Sodium: 131 mmol/L — ABNORMAL LOW (ref 135–145)
Total Bilirubin: 0.4 mg/dL (ref 0.3–1.2)
Total Protein: 6.6 g/dL (ref 6.5–8.1)

## 2018-02-02 LAB — MAGNESIUM: Magnesium: 1.7 mg/dL (ref 1.7–2.4)

## 2018-02-02 MED ORDER — SODIUM CHLORIDE 0.9 % IV SOLN
Freq: Once | INTRAVENOUS | Status: AC
  Administered 2018-02-02: 10:00:00 via INTRAVENOUS
  Filled 2018-02-02: qty 1000

## 2018-02-02 MED ORDER — ONDANSETRON HCL 4 MG PO TABS
8.0000 mg | ORAL_TABLET | Freq: Once | ORAL | Status: AC
Start: 2018-02-02 — End: 2018-02-02
  Administered 2018-02-02: 8 mg via ORAL
  Filled 2018-02-02: qty 2

## 2018-02-02 MED ORDER — SODIUM CHLORIDE 0.9 % IV SOLN
1400.0000 mg | Freq: Once | INTRAVENOUS | Status: AC
  Administered 2018-02-02: 1400 mg via INTRAVENOUS
  Filled 2018-02-02: qty 26.3

## 2018-02-02 MED ORDER — HEPARIN SOD (PORK) LOCK FLUSH 100 UNIT/ML IV SOLN
500.0000 [IU] | Freq: Once | INTRAVENOUS | Status: AC
  Administered 2018-02-02: 500 [IU] via INTRAVENOUS
  Filled 2018-02-02: qty 5

## 2018-02-02 MED ORDER — SODIUM CHLORIDE 0.9% FLUSH
10.0000 mL | INTRAVENOUS | Status: DC | PRN
  Administered 2018-02-02: 10 mL via INTRAVENOUS
  Filled 2018-02-02: qty 10

## 2018-02-02 NOTE — Progress Notes (Signed)
Reviewed labs with Honor Loh NP and Dr. Mike Gip. (WBC 3.3, ANC 1.9 showing a drop from 7 days ago) (Sodium 131 which remains baseline for pt). Per Dr. Mike Gip okay to proceed with treatment, pt to return to clinic 02/09/18 and 02/10/18 for labs and possible transfusion. Pt aware and verbalizes understanding.

## 2018-02-03 ENCOUNTER — Telehealth: Payer: Self-pay | Admitting: *Deleted

## 2018-02-03 NOTE — Telephone Encounter (Signed)
-----   Message from Lequita Asal, MD sent at 02/03/2018 12:40 PM EDT ----- Regarding: RE: Appt yesterday-labs? sent to [facility] as well  Patient should have appt for labs on 04/11 and 04/12.  Platelets typically decrease (that's why lab check on 04/11).  We can send them a copy of labs from yesterday.  M  ----- Message ----- From: Wilburn Cornelia Sent: 02/03/2018  11:56 AM To: Maryann Conners, NT, [name], NP, # Subject: Appt yesterday-labs? sent to [facility] as well    Pt wife/SGO called me and stated that they may have to come back for more labs, from conversation from appt yesterday? She would like a copy of the labs from the appt as well.  She stated the schedulers were busy and he could wait to get printout, so theyd like a copy of those mailed please. I sent call to Hassan Rowan but wanted to cover base with TEAM as well.

## 2018-02-03 NOTE — Telephone Encounter (Signed)
Patient informed that he does not need lab check until 4/11

## 2018-02-06 ENCOUNTER — Inpatient Hospital Stay: Payer: Non-veteran care

## 2018-02-06 DIAGNOSIS — Z5111 Encounter for antineoplastic chemotherapy: Secondary | ICD-10-CM | POA: Diagnosis not present

## 2018-02-06 DIAGNOSIS — C7951 Secondary malignant neoplasm of bone: Secondary | ICD-10-CM

## 2018-02-06 MED ORDER — CYANOCOBALAMIN 1000 MCG/ML IJ SOLN
1000.0000 ug | Freq: Once | INTRAMUSCULAR | Status: AC
Start: 2018-02-06 — End: 2018-02-06
  Administered 2018-02-06: 1000 ug via INTRAMUSCULAR

## 2018-02-09 ENCOUNTER — Inpatient Hospital Stay: Payer: Non-veteran care

## 2018-02-09 ENCOUNTER — Other Ambulatory Visit: Payer: Self-pay | Admitting: Urgent Care

## 2018-02-09 DIAGNOSIS — C672 Malignant neoplasm of lateral wall of bladder: Secondary | ICD-10-CM

## 2018-02-09 DIAGNOSIS — C7951 Secondary malignant neoplasm of bone: Secondary | ICD-10-CM

## 2018-02-09 DIAGNOSIS — Z5111 Encounter for antineoplastic chemotherapy: Secondary | ICD-10-CM | POA: Diagnosis not present

## 2018-02-09 LAB — BASIC METABOLIC PANEL
Anion gap: 8 (ref 5–15)
BUN: 10 mg/dL (ref 6–20)
CO2: 22 mmol/L (ref 22–32)
Calcium: 9.1 mg/dL (ref 8.9–10.3)
Chloride: 102 mmol/L (ref 101–111)
Creatinine, Ser: 0.55 mg/dL — ABNORMAL LOW (ref 0.61–1.24)
GFR calc Af Amer: >60 mL/min (ref >60)
GFR calc non Af Amer: >60 mL/min (ref >60)
Glucose, Bld: 96 mg/dL (ref 65–99)
Potassium: 4 mmol/L (ref 3.5–5.1)
Sodium: 132 mmol/L — ABNORMAL LOW (ref 135–145)

## 2018-02-09 LAB — CBC WITH DIFFERENTIAL/PLATELET
Basophils Absolute: 0 10*3/uL (ref 0–0.1)
Basophils Relative: 1 %
Eosinophils Absolute: 0 10*3/uL (ref 0–0.7)
Eosinophils Relative: 1 %
HCT: 25 % — ABNORMAL LOW (ref 40.0–52.0)
Hemoglobin: 8.9 g/dL — ABNORMAL LOW (ref 13.0–18.0)
Lymphocytes Relative: 34 %
Lymphs Abs: 0.9 10*3/uL — ABNORMAL LOW (ref 1.0–3.6)
MCH: 34.9 pg — ABNORMAL HIGH (ref 26.0–34.0)
MCHC: 35.4 g/dL (ref 32.0–36.0)
MCV: 98.5 fL (ref 80.0–100.0)
Monocytes Absolute: 0.5 10*3/uL (ref 0.2–1.0)
Monocytes Relative: 19 %
Neutro Abs: 1.2 10*3/uL — ABNORMAL LOW (ref 1.4–6.5)
Neutrophils Relative %: 45 %
Platelets: 31 10*3/uL — ABNORMAL LOW (ref 150–440)
RBC: 2.54 MIL/uL — ABNORMAL LOW (ref 4.40–5.90)
RDW: 20.6 % — ABNORMAL HIGH (ref 11.5–14.5)
WBC: 2.6 10*3/uL — ABNORMAL LOW (ref 3.8–10.6)

## 2018-02-09 MED ORDER — SODIUM CHLORIDE 0.9 % IV SOLN
Freq: Once | INTRAVENOUS | Status: AC
  Administered 2018-02-09: 12:00:00 via INTRAVENOUS
  Filled 2018-02-09: qty 1000

## 2018-02-10 ENCOUNTER — Other Ambulatory Visit: Payer: Self-pay | Admitting: Urgent Care

## 2018-02-10 ENCOUNTER — Inpatient Hospital Stay: Payer: Non-veteran care

## 2018-02-10 DIAGNOSIS — C3411 Malignant neoplasm of upper lobe, right bronchus or lung: Secondary | ICD-10-CM

## 2018-02-10 DIAGNOSIS — Z5111 Encounter for antineoplastic chemotherapy: Secondary | ICD-10-CM | POA: Diagnosis not present

## 2018-02-10 DIAGNOSIS — T451X5A Adverse effect of antineoplastic and immunosuppressive drugs, initial encounter: Secondary | ICD-10-CM

## 2018-02-10 DIAGNOSIS — D701 Agranulocytosis secondary to cancer chemotherapy: Secondary | ICD-10-CM

## 2018-02-10 DIAGNOSIS — Z95828 Presence of other vascular implants and grafts: Secondary | ICD-10-CM

## 2018-02-10 LAB — CBC WITH DIFFERENTIAL/PLATELET
Basophils Absolute: 0 10*3/uL (ref 0–0.1)
Basophils Relative: 1 %
Eosinophils Absolute: 0.1 10*3/uL (ref 0–0.7)
Eosinophils Relative: 1 %
HCT: 25.9 % — ABNORMAL LOW (ref 40.0–52.0)
HEMOGLOBIN: 9.2 g/dL — AB (ref 13.0–18.0)
LYMPHS ABS: 1.2 10*3/uL (ref 1.0–3.6)
LYMPHS PCT: 26 %
MCH: 34.9 pg — ABNORMAL HIGH (ref 26.0–34.0)
MCHC: 35.4 g/dL (ref 32.0–36.0)
MCV: 98.6 fL (ref 80.0–100.0)
MONO ABS: 0.8 10*3/uL (ref 0.2–1.0)
MONOS PCT: 18 %
NEUTROS ABS: 2.5 10*3/uL (ref 1.4–6.5)
NEUTROS PCT: 54 %
Platelets: 32 10*3/uL — ABNORMAL LOW (ref 150–440)
RBC: 2.62 MIL/uL — ABNORMAL LOW (ref 4.40–5.90)
RDW: 21.2 % — AB (ref 11.5–14.5)
WBC: 4.7 10*3/uL (ref 3.8–10.6)

## 2018-02-10 MED ORDER — SODIUM CHLORIDE 0.9% FLUSH
10.0000 mL | INTRAVENOUS | Status: AC | PRN
  Administered 2018-02-10: 10 mL
  Filled 2018-02-10: qty 10

## 2018-02-10 MED ORDER — HEPARIN SOD (PORK) LOCK FLUSH 100 UNIT/ML IV SOLN
500.0000 [IU] | INTRAVENOUS | Status: AC | PRN
  Administered 2018-02-10: 500 [IU]

## 2018-02-15 ENCOUNTER — Other Ambulatory Visit: Payer: Self-pay | Admitting: Hematology and Oncology

## 2018-02-15 DIAGNOSIS — E538 Deficiency of other specified B group vitamins: Secondary | ICD-10-CM

## 2018-02-15 NOTE — Progress Notes (Signed)
[facility] day:  02/16/2018  Chief Complaint: Patrick Hooper is a 72 y.o. male with metastatic bladder cancer who is seen for assessment prior to day 1 of cycle #6 of carboplatin and gemcitabine and monthly Xgeva.  HPI:  The patient was last seen in the medical oncology clinic on 01/26/2018.  At that time, he felt "good".  He had shortness of breath at times.  He continued to experience positional vertigo symptoms. He had opioid induced constipation that has improved with stool softeners BID and Miralax.  He denied any fever.  Pain was well controlled.  Exam was stable. WBC was 7000 with an [place] 4500.  Hemoglobin was 9.6, hematocrit 27.6, and platelets 452,000.  Calcium was 8.5. Sodium was 134 (low). Potassium was 3.2 (low).   He began cycle #5 carboplatin and gemcitabine at last appointment.  He received IV potassium.  He received day 8 gemcitabine on 02/02/2018.  Platelet nadir was 31,000 on 02/09/2018.  During the interim, he has felt about the same.  He notes some positional vertigo.  Pain is a level 3 out of 10 in his neck, shoulders, and low back.  He is taking Tylenol.  He is using pain medications sparingly secondary to constipation.  He is not drinking Ensure.  He is taking Megace.  Weight is up 1 pound.    Past Medical History:  Diagnosis Date  . Bladder cancer ([place])   . Hypertension   . Lung cancer ([place]) 2016  . PVD (peripheral vascular disease) ([place])     Past Surgical History:  Procedure Laterality Date  . BLADDER SURGERY  08/12/2017  . BYPASS GRAFT FEMORAL/POPLITEAL W/VEIN  2009  . COLONOSCOPY  2013  . CYSTOSCOPY  08/12/2017  . IR ABDOMINAL AORTOBIFEMORAL CATHETER SERIALOGRAM  2009  . LOBECTOMY Right 09/2015  . PACEMAKER INSERTION  2003  . PORTA CATH INSERTION N/A 11/21/2017   Procedure: PORTA CATH INSERTION;  Surgeon: Algernon Huxley, MD;  Location: [facility];  Service: Cardiovascular;  Laterality: N/A;    Family  History  Problem Relation Age of Onset  . Cancer Mother   . Cancer Sister   . Cancer Maternal Grandmother     Social History:  reports that he has been smoking cigarettes.  He has a 55.00 pack-year smoking history. He has never used smokeless tobacco. He reports that he drinks alcohol. He reports that he does not use drugs.  He smokes 1 pack a day for > 55 years.  He used to drink 4 alcoholic beverages/day.  Now his alcohol intake is infrequent. 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 There is the potential of past exposure to radiation and/or toxins. He is registered with the [place] regarding his exposures.  He lives in [place].  The patient is accompanied by his girlfriend Patrick Hooper) today.  Allergies:  Allergies  Allergen Reactions  . Cefazolin Rash and Shortness Of Breath  . Carvedilol Other (See Comments)    fatigue  . Chlorthalidone Other (See Comments)    Other reaction(s): Unknown  . Hydralazine     Other reaction(s): Unknown  . Hydrochlorothiazide Other (See Comments)    Current Medications: Current Outpatient Medications  Medication Sig Dispense Refill  . ALPRAZolam (XANAX) 0.25 MG tablet Take 1 tablet (0.25 mg total) by mouth 2 (two) times daily as needed for anxiety. 30 tablet 0  . calcium carbonate 1250 MG capsule Take  1,250 mg by mouth daily.    . cholecalciferol (VITAMIN D) 400 units TABS tablet Take 800 Units by mouth daily.    . clopidogrel (PLAVIX) 75 MG tablet Take 1 tablet (75 mg total) by mouth at bedtime. 90 tablet 1  . docusate sodium (COLACE) 100 MG capsule Take 200 mg by mouth daily.     Marland Kitchen gabapentin (NEURONTIN) 100 MG capsule Take 1 capsule (100 mg total) by mouth at bedtime. 30 capsule 3  . polyethylene glycol (MIRALAX / GLYCOLAX) packet Take 17 g by mouth daily. (Patient taking differently: Take 17 g by mouth daily as needed. ) 30 each 0  . potassium chloride SA (K-DUR,KLOR-CON) 20 MEQ tablet Take 1 tablet  (20 mEq total) by mouth daily. 90 tablet 1  . acetaminophen (TYLENOL) 500 MG tablet Take 500 mg by mouth every 6 (six) hours as needed for mild pain.     Marland Kitchen albuterol (PROVENTIL HFA) 108 (90 Base) MCG/ACT inhaler Inhale 2 puffs into the lungs every 6 (six) hours as needed.    Marland Kitchen aspirin EC 81 MG tablet Take 81 mg by mouth at bedtime.    Marland Kitchen dexamethasone (DECADRON) 4 MG tablet Take 2 tablets (8 mg total) by mouth daily. Start the day after carboplatin chemotherapy for 2 days. (Patient not taking: Reported on 02/16/2018) 30 tablet 1  . HYDROcodone-acetaminophen (LORTAB) 5-325 MG tablet Take 1 tablet by mouth every 6 (six) hours as needed for moderate pain. 30 tablet 0  . lactulose (CHRONULAC) 10 GM/15ML solution Take 45 mLs (30 g total) by mouth 2 (two) times daily as needed for mild constipation or severe constipation. (Patient not taking: Reported on 01/05/2018) 1892 mL 0  . lidocaine-prilocaine (EMLA) cream Apply 1 application topically PRN 1 hour prior to procedure. Cover with press and seal until treatment. (Patient not taking: Reported on 01/10/2018) 30 g 0  . LORazepam (ATIVAN) 0.5 MG tablet Take 1 tablet (0.5 mg total) by mouth every 6 (six) hours as needed (Nausea or vomiting). (Patient not taking: Reported on 02/16/2018) 30 tablet 0  . magnesium oxide (MAG-OX) 400 (241.3 Mg) MG tablet Take 1 tablet (400 mg total) by mouth daily. 30 tablet 1  . megestrol (MEGACE ORAL) 40 MG/ML suspension Take 5 mLs (200 mg total) by mouth daily. (Patient not taking: Reported on 01/10/2018) 240 mL 0  . morphine (MS CONTIN) 15 MG 12 hr tablet Take 1 tablet (15 mg total) by mouth every 12 (twelve) hours. (Patient not taking: Reported on 01/05/2018) 30 tablet 0  . ondansetron (ZOFRAN) 4 MG tablet Take 1 tablet (4 mg total) by mouth every 6 (six) hours as needed for nausea. (Patient not taking: Reported on 01/10/2018) 20 tablet 0  . oxycodone (OXY-IR) 5 MG capsule Take 1 capsule (5 mg total) by mouth every 4 (four) hours as  needed for pain. (Patient not taking: Reported on 01/05/2018) 30 capsule 0  . protein supplement shake (PREMIER PROTEIN) LIQD Take 325 mLs (11 oz total) by mouth 2 (two) times daily between meals. (Patient not taking: Reported on 01/05/2018) 60 Can 0   No current facility-administered medications for this visit.    Facility-Administered Medications Ordered in Other Visits  Medication Dose Route Frequency Provider Last Rate Last Dose  . 0.9 %  sodium chloride infusion   Intravenous Continuous Verlon Au, NP 500 mL/hr at 10/31/17 1400      Review of Systems:  GENERAL:  Feels "the same".  No fevers, sweats.  Weight up 1  pound. PERFORMANCE STATUS (ECOG):  1-2 HEENT:  No visual changes, runny nose, sore throat, mouth sores or tenderness. Lungs: Shortness of breath with exertion.  No cough.  No hemoptysis. Cardiac:  Implanted pacemaker (NOT MRI COMPATIBLE).  No chest pain, palpitations, orthopnea, or PND. GI:  Constipation with pain medications.  No nausea, vomiting, diarrhea, constipation, melena or hematochezia. GU:  Urgency, frequency, dysuria, or hematuria. Dribbling. Musculoskeletal:  Chronic pain in neck, shoulders, and low back.  Joint pain (wrist and elbow).  No muscle tenderness. Extremities:  No pain or swelling. Skin:  No rashes or skin changes. Neuro:  No headache, numbness or weakness, balance or coordination issues. Endocrine:  No diabetes, thyroid issues, hot flashes or night sweats. Psych:  No mood changes, depression or anxiety. Pain:  No focal pain. Review of systems:  All other systems reviewed and found to be negative.   Physical Exam: Blood pressure 115/76, pulse (!) 106, temperature (!) 94.7 F (34.8 C), temperature source Tympanic, resp. rate 20, weight 126 lb 1.6 oz (57.2 kg). GENERAL:  Thin gentleman sitting comfortably in the exam room in no acute distress. MENTAL STATUS:  Alert and oriented to person, place and time. HEAD:  Thin gray hair.  Normocephalic,  atraumatic, face symmetric, no Cushingoid features. EYES:  Blue eyes.  Pupils equal round and reactive to light and accomodation.  No conjunctivitis or scleral icterus. ENT:  Oropharynx clear without lesion.  Tongue normal.  Upper dentures.  No lower teeth.  Mucous membranes moist.  RESPIRATORY:  Clear to auscultation without rales, wheezes or rhonchi. CARDIOVASCULAR:  Regular rate and rhythm without murmur, rub or gallop. CHEST:  Left sided pacemaker. ABDOMEN:  Soft, non-tender, with active bowel sounds, and no hepatosplenomegaly.  No masses. SKIN:  Nicotine stained nails.  No rashes, ulcers or lesions. EXTREMITIES: No edema, no skin discoloration or tenderness.  No palpable cords. LYMPH NODES: No palpable cervical, supraclavicular, axillary or inguinal adenopathy  NEUROLOGICAL: Unremarkable. PSYCH:  Appropriate.    Infusion on 02/16/2018  Component Date Value Ref Range Status  . Methylmalonic Acid, Quantitative 02/16/2018 161  0 - 378 nmol/L Final  . Disclaimer: 02/16/2018 Comment   Final   Comment: (NOTE) This test was developed and its performance characteristics determined by [facility]. It has not been cleared or approved by the Food and Drug Administration. Performed At: [facility] [place], [place] [id number] Rush Farmer MD [id number] Performed at [facility], [address], [place], [place] [address]   . Vitamin B-12 02/16/2018 440  180 - 914 pg/mL Final   Comment: (NOTE) This assay is not validated for testing neonatal or myeloproliferative syndrome specimens for Vitamin B12 levels. Performed at [facility], [place] [address] [place], [place] [address]   . Magnesium 02/16/2018 1.7  1.7 - 2.4 mg/dL Final   Performed at [facility], [address] [place], [place] [address]  . Sodium 02/16/2018 129* 135 - 145 mmol/L Final  . Potassium 02/16/2018 3.7  3.5 - 5.1 mmol/L Final  . Chloride 02/16/2018 99* 101 - 111 mmol/L  Final  . CO2 02/16/2018 22  22 - 32 mmol/L Final  . Glucose, Bld 02/16/2018 102* 65 - 99 mg/dL Final  . BUN 02/16/2018 13  6 - 20 mg/dL Final  . Creatinine, Ser 02/16/2018 0.66  0.61 - 1.24 mg/dL Final  . Calcium 02/16/2018 9.0  8.9 - 10.3 mg/dL Final  . Total Protein 02/16/2018 6.8  6.5 - 8.1 g/dL Final  .  Albumin 02/16/2018 3.4* 3.5 - 5.0 g/dL Final  . AST 02/16/2018 25  15 - 41 U/L Final  . ALT 02/16/2018 21  17 - 63 U/L Final  . Alkaline Phosphatase 02/16/2018 127* 38 - 126 U/L Final  . Total Bilirubin 02/16/2018 0.3  0.3 - 1.2 mg/dL Final  . GFR calc non Af Amer 02/16/2018 >60  >60 mL/min Final  . GFR calc Af Amer 02/16/2018 >60  >60 mL/min Final   Comment: (NOTE) The eGFR has been calculated using the CKD EPI equation. This calculation has not been validated in all clinical situations. eGFR's persistently <60 mL/min signify possible Chronic Kidney Disease.   Georgiann Hahn gap 02/16/2018 8  5 - 15 Final   Performed at [facility], [address] [place], [place] [address]  . WBC 02/16/2018 7.3  3.8 - 10.6 K/uL Final  . RBC 02/16/2018 2.78* 4.40 - 5.90 MIL/uL Final  . Hemoglobin 02/16/2018 9.9* 13.0 - 18.0 g/dL Final  . HCT 02/16/2018 27.6* 40.0 - 52.0 % Final  . MCV 02/16/2018 99.5  80.0 - 100.0 fL Final  . MCH 02/16/2018 35.7* 26.0 - 34.0 pg Final  . MCHC 02/16/2018 35.9  32.0 - 36.0 g/dL Final  . RDW 02/16/2018 21.9* 11.5 - 14.5 % Final  . Platelets 02/16/2018 334  150 - 440 K/uL Final  . Neutrophils Relative % 02/16/2018 67  % Final  . Neutro Abs 02/16/2018 5.0  1.4 - 6.5 K/uL Final  . Lymphocytes Relative 02/16/2018 14  % Final  . Lymphs Abs 02/16/2018 1.0  1.0 - 3.6 K/uL Final  . Monocytes Relative 02/16/2018 16  % Final  . Monocytes Absolute 02/16/2018 1.2* 0.2 - 1.0 K/uL Final  . Eosinophils Relative 02/16/2018 2  % Final  . Eosinophils Absolute 02/16/2018 0.1  0 - 0.7 K/uL Final  . Basophils Relative 02/16/2018 1  % Final  . Basophils Absolute 02/16/2018 0.0  0  - 0.1 K/uL Final   Performed at [facility], [address], [place], [place] [address]    Assessment:  Patrick Hooper is a 72 y.o. male with metastatic disease.  He has a recent history of muscle invasive bladder cancer and a distant history of lung cancer.  He has a history of stage IB lung cancer status post right upper lobe lobectomy in 09/2015.  Pathology revealed a T2aN0M0 adenocarcinoma.  Chest CT on 05/25/2017 revealed postoperative changes from a right upper lobe lobectomy without evidence of recurrence. There was new diffuse punctate sclerotic osseous lesions throughout the chest concerning for metastatic disease.  [facility] on 06/08/2017 at [facility] revealed right upper lobe lobectomy without evidence of recurrence. There was a new 3.2 cm mass along the right inferior bladder wall, anterior to the ureteral vesicular junction.  There were multiple new focal sclerotic and some probable mixed lytic and sclerotic osseous lesions in the axial skeleton many of which demonstrated increased FDG avidity. Differential diagnoses included include metastatic lung cancer, bladder cancer or prostate cancer. There was increased FDG uptake in the left medial thigh musculature of uncertain etiology.  PET scan on 09/09/2017 revealed several hypermetabolic mixed lytic and sclerotic osseous metastases throughout the axial skeleton.  There was suspected intramuscular soft tissue metastases in the right quadratus lumborum and posterior left lumbar paraspinal musculature.  There was mildly hypermetabolic retroperitoneal lymphadenopathy, probably representing nodal metastases.  There was diffuse irregular bladder wall thickening, asymmetrically prominent in the right bladder wall, suspicious for residual/recurrent bladder neoplasm.  There was nonspecific small ground-glass nodular opacity in the left upper lung lobe with low level metabolism.  There was no evidence of local tumor recurrence in the  right lung status post right upper lobectomy   He underwent transurethral resection of bladder tumor (TURBT) with bilateral retrograde pyelogram on 08/12/2017.  He had a 3 cm bladder tumor on the right lateral wall with some sessile and papillary features.  Retrograde pyelograms were normal without filling defect.  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  He began monthly Xgeva on 09/16/2017 (last 01/19/2018).  He is s/p 5 cycles of carboplatin and gemcitabine (11/03/2017 - 01/26/2018).  Cycle #1 was complicated by orthostatic hypotension and fatigue requiring IVF and delay in day 8 gemcitabine (80% dose).  Cycle #2 was complicated by fecal impaction.  He did not receive gemcitabine of day 8 (WBC 1800 with an ANC of 1000).  Nadir platelet count was 23,000 with cycle #3, 19,000 with cycle #4, and 31,000 with cycle #5.   PET scan on 01/23/2018 revealed a mixed but generally positive response to therapy, most typified by decreased hypermetabolism throughout the numerous musculoskeletal metastases. While there were several new and enlarged sclerotic osseous lesions, most of these demonstrated no hypermetabolism, presumably treated metastatic lesions.  There was nonenlarged but mildly hypermetabolic right level 5B lymph node in the lower right cervical region. There were several other nonenlarged non hypermetabolic lymph nodes  adjacent to this area., uncertain etiology and significance.  He has had issues with hypotension despite a history of hypertension.  He is off blood pressure medications.  Cortisol level was normal on 10/27/2017.  He has a normocytic anemia.  Work-up on 11/24/2017 revealed a normal ferritin, iron studies, folate.  B12 was 243 (low) and methylmalonic acid 151 (normal).  He has a history of peripheral vascular disease s/p fem-fem bypass in 2016. He is on Plavix.  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   Colonoscopy was normal in 2011.  Symptomatically, he feels "ok".  He has intermittent shortness of breath and positional vertigo symptoms. Patient has opioid induced constipation and thus takes Tylenol for pain.  Pain is well controlled.  Exam is stable. WBC is 7300 with an [place] 5000.  Hemoglobin is 9.9, hematocrit 27.6, and platelets 334,000.  Calcium is 9.0. Sodium 129 (low). Potassium 3.7.   Magnesium is 1.7.  Plan: 1.  Labs today:  CBC with diff, CMP, Mg, B12, MMA. 2.  Discuss electrolytes.  Encourage fluids with electrolytes and added salt.  Patient states he won't drink gatorade. Continue current oral potassium. 3.  Continue calcium and vitamin D. 4.  Refill Lortab (#30), Xanax (#30), and magnesium. 5.  Begin day 1 of cycle #6 carboplatin and gemcitabine (80% dose). 6.  Xgeva today and monthly. 7.  RTC in 1 week for labs (CBC with diff, CMP, Mg), +/- IVF, and day 8 of cycle #6 gemcitabine. 8.  RTC in 2 weeks for labs (CBC with diff, BMP), and +/- IVF. 9.  RTC in 3 weeks for MD assessment, labs (CBC with diff, CMP, Mg), +/- IVFs, and day 1 of cycle #7 carboplatin + gemcitabine.   Lequita Asal, MD  02/16/2018, 4:35 PM

## 2018-02-16 ENCOUNTER — Inpatient Hospital Stay: Payer: Non-veteran care

## 2018-02-16 ENCOUNTER — Encounter: Payer: Self-pay | Admitting: Hematology and Oncology

## 2018-02-16 ENCOUNTER — Other Ambulatory Visit: Payer: Self-pay | Admitting: Hematology and Oncology

## 2018-02-16 ENCOUNTER — Inpatient Hospital Stay (HOSPITAL_BASED_OUTPATIENT_CLINIC_OR_DEPARTMENT_OTHER): Payer: Non-veteran care | Admitting: Hematology and Oncology

## 2018-02-16 ENCOUNTER — Other Ambulatory Visit: Payer: Self-pay

## 2018-02-16 VITALS — BP 115/76 | HR 106 | Temp 94.7°F | Resp 20 | Wt 126.1 lb

## 2018-02-16 DIAGNOSIS — Z5111 Encounter for antineoplastic chemotherapy: Secondary | ICD-10-CM

## 2018-02-16 DIAGNOSIS — R918 Other nonspecific abnormal finding of lung field: Secondary | ICD-10-CM

## 2018-02-16 DIAGNOSIS — H8110 Benign paroxysmal vertigo, unspecified ear: Secondary | ICD-10-CM

## 2018-02-16 DIAGNOSIS — E538 Deficiency of other specified B group vitamins: Secondary | ICD-10-CM

## 2018-02-16 DIAGNOSIS — F17210 Nicotine dependence, cigarettes, uncomplicated: Secondary | ICD-10-CM

## 2018-02-16 DIAGNOSIS — R001 Bradycardia, unspecified: Secondary | ICD-10-CM

## 2018-02-16 DIAGNOSIS — Z85118 Personal history of other malignant neoplasm of bronchus and lung: Secondary | ICD-10-CM

## 2018-02-16 DIAGNOSIS — I739 Peripheral vascular disease, unspecified: Secondary | ICD-10-CM | POA: Diagnosis not present

## 2018-02-16 DIAGNOSIS — K5900 Constipation, unspecified: Secondary | ICD-10-CM | POA: Diagnosis not present

## 2018-02-16 DIAGNOSIS — C7951 Secondary malignant neoplasm of bone: Secondary | ICD-10-CM

## 2018-02-16 DIAGNOSIS — D649 Anemia, unspecified: Secondary | ICD-10-CM | POA: Diagnosis not present

## 2018-02-16 DIAGNOSIS — C672 Malignant neoplasm of lateral wall of bladder: Secondary | ICD-10-CM

## 2018-02-16 DIAGNOSIS — G893 Neoplasm related pain (acute) (chronic): Secondary | ICD-10-CM

## 2018-02-16 DIAGNOSIS — R7989 Other specified abnormal findings of blood chemistry: Secondary | ICD-10-CM

## 2018-02-16 DIAGNOSIS — I10 Essential (primary) hypertension: Secondary | ICD-10-CM | POA: Diagnosis not present

## 2018-02-16 DIAGNOSIS — Z79899 Other long term (current) drug therapy: Secondary | ICD-10-CM

## 2018-02-16 DIAGNOSIS — F419 Anxiety disorder, unspecified: Secondary | ICD-10-CM

## 2018-02-16 DIAGNOSIS — Z7982 Long term (current) use of aspirin: Secondary | ICD-10-CM

## 2018-02-16 DIAGNOSIS — R0602 Shortness of breath: Secondary | ICD-10-CM

## 2018-02-16 DIAGNOSIS — Z7189 Other specified counseling: Secondary | ICD-10-CM

## 2018-02-16 DIAGNOSIS — R590 Localized enlarged lymph nodes: Secondary | ICD-10-CM

## 2018-02-16 LAB — CBC WITH DIFFERENTIAL/PLATELET
Basophils Absolute: 0 10*3/uL (ref 0–0.1)
Basophils Relative: 1 %
Eosinophils Absolute: 0.1 10*3/uL (ref 0–0.7)
Eosinophils Relative: 2 %
HCT: 27.6 % — ABNORMAL LOW (ref 40.0–52.0)
Hemoglobin: 9.9 g/dL — ABNORMAL LOW (ref 13.0–18.0)
Lymphocytes Relative: 14 %
Lymphs Abs: 1 10*3/uL (ref 1.0–3.6)
MCH: 35.7 pg — ABNORMAL HIGH (ref 26.0–34.0)
MCHC: 35.9 g/dL (ref 32.0–36.0)
MCV: 99.5 fL (ref 80.0–100.0)
Monocytes Absolute: 1.2 10*3/uL — ABNORMAL HIGH (ref 0.2–1.0)
Monocytes Relative: 16 %
Neutro Abs: 5 10*3/uL (ref 1.4–6.5)
Neutrophils Relative %: 67 %
Platelets: 334 10*3/uL (ref 150–440)
RBC: 2.78 MIL/uL — ABNORMAL LOW (ref 4.40–5.90)
RDW: 21.9 % — ABNORMAL HIGH (ref 11.5–14.5)
WBC: 7.3 10*3/uL (ref 3.8–10.6)

## 2018-02-16 LAB — COMPREHENSIVE METABOLIC PANEL
ALT: 21 U/L (ref 17–63)
AST: 25 U/L (ref 15–41)
Albumin: 3.4 g/dL — ABNORMAL LOW (ref 3.5–5.0)
Alkaline Phosphatase: 127 U/L — ABNORMAL HIGH (ref 38–126)
Anion gap: 8 (ref 5–15)
BUN: 13 mg/dL (ref 6–20)
CO2: 22 mmol/L (ref 22–32)
Calcium: 9 mg/dL (ref 8.9–10.3)
Chloride: 99 mmol/L — ABNORMAL LOW (ref 101–111)
Creatinine, Ser: 0.66 mg/dL (ref 0.61–1.24)
GFR calc Af Amer: >60 mL/min (ref >60)
GFR calc non Af Amer: >60 mL/min (ref >60)
Glucose, Bld: 102 mg/dL — ABNORMAL HIGH (ref 65–99)
Potassium: 3.7 mmol/L (ref 3.5–5.1)
Sodium: 129 mmol/L — ABNORMAL LOW (ref 135–145)
Total Bilirubin: 0.3 mg/dL (ref 0.3–1.2)
Total Protein: 6.8 g/dL (ref 6.5–8.1)

## 2018-02-16 LAB — MAGNESIUM: Magnesium: 1.7 mg/dL (ref 1.7–2.4)

## 2018-02-16 LAB — VITAMIN B12: Vitamin B-12: 440 pg/mL (ref 180–914)

## 2018-02-16 MED ORDER — SODIUM CHLORIDE 0.9 % IV SOLN
Freq: Once | INTRAVENOUS | Status: AC
  Administered 2018-02-16: 10:00:00 via INTRAVENOUS
  Filled 2018-02-16: qty 1000

## 2018-02-16 MED ORDER — HYDROCODONE-ACETAMINOPHEN 5-325 MG PO TABS: 1.0000 | ORAL_TABLET | Freq: Four times a day (QID) | ORAL | 0 refills | Status: DC | PRN

## 2018-02-16 MED ORDER — HEPARIN SOD (PORK) LOCK FLUSH 100 UNIT/ML IV SOLN
250.0000 [IU] | Freq: Once | INTRAVENOUS | Status: DC | PRN
Start: 2018-02-16 — End: 2018-02-16
  Filled 2018-02-16: qty 5

## 2018-02-16 MED ORDER — MAGNESIUM OXIDE 400 (241.3 MG) MG PO TABS: 400.0000 mg | ORAL_TABLET | Freq: Every day | ORAL | 1 refills | Status: DC

## 2018-02-16 MED ORDER — DEXAMETHASONE SODIUM PHOSPHATE 10 MG/ML IJ SOLN
10.0000 mg | Freq: Once | INTRAMUSCULAR | Status: AC
  Administered 2018-02-16: 10 mg via INTRAVENOUS
  Filled 2018-02-16: qty 1

## 2018-02-16 MED ORDER — SODIUM CHLORIDE 0.9 % IV SOLN: 10.0000 mg | Freq: Once | INTRAVENOUS | Status: DC

## 2018-02-16 MED ORDER — SODIUM CHLORIDE 0.9 % IV SOLN
1400.0000 mg | Freq: Once | INTRAVENOUS | Status: AC
  Administered 2018-02-16: 1400 mg via INTRAVENOUS
  Filled 2018-02-16: qty 26.3

## 2018-02-16 MED ORDER — HEPARIN SOD (PORK) LOCK FLUSH 100 UNIT/ML IV SOLN
500.0000 [IU] | Freq: Once | INTRAVENOUS | Status: AC
  Administered 2018-02-16: 500 [IU] via INTRAVENOUS

## 2018-02-16 MED ORDER — SODIUM CHLORIDE 0.9 % IV SOLN
1.0000 g | Freq: Once | INTRAVENOUS | Status: AC
  Administered 2018-02-16: 1 g via INTRAVENOUS
  Filled 2018-02-16: qty 2

## 2018-02-16 MED ORDER — SODIUM CHLORIDE 0.9% FLUSH
10.0000 mL | INTRAVENOUS | Status: DC | PRN
  Administered 2018-02-16: 10 mL via INTRAVENOUS
  Filled 2018-02-16: qty 10

## 2018-02-16 MED ORDER — DENOSUMAB 120 MG/1.7ML ~~LOC~~ SOLN
120.0000 mg | Freq: Once | SUBCUTANEOUS | Status: AC
  Administered 2018-02-16: 120 mg via SUBCUTANEOUS
  Filled 2018-02-16: qty 1.7

## 2018-02-16 MED ORDER — PALONOSETRON HCL INJECTION 0.25 MG/5ML
0.2500 mg | Freq: Once | INTRAVENOUS | Status: AC
  Administered 2018-02-16: 0.25 mg via INTRAVENOUS
  Filled 2018-02-16: qty 5

## 2018-02-16 MED ORDER — ALPRAZOLAM 0.25 MG PO TABS: 0.2500 mg | ORAL_TABLET | Freq: Two times a day (BID) | ORAL | 0 refills | Status: DC | PRN

## 2018-02-16 MED ORDER — SODIUM CHLORIDE 0.9 % IV SOLN
324.0000 mg | Freq: Once | INTRAVENOUS | Status: AC
  Administered 2018-02-16: 320 mg via INTRAVENOUS
  Filled 2018-02-16: qty 32

## 2018-02-16 NOTE — Progress Notes (Signed)
Here for follow up. Stated pain at level 3 today-see follow up. Not taking protein drinks per pt and wife.

## 2018-02-16 NOTE — Progress Notes (Signed)
Nutrition Follow-up:  Patient with metastatic bladder cancer receiving chemotherapy.    Met with patient during infusion.  Wife not present.  Patient reports that appetite has not been good over the last few weeks due to bone pain. Reports that tylenol is not working and having a hard time sleeping at night due to the pain.  Reports that this is effecting appetite as well.  Reports that Dr. Mike Gip has adjusted pain medications today so plans on starting new regimen. Reports that he is out of megace but getting refill.  Patient talked about going to [place] after bone scan around the first of May.  Named several restaurants that he is looking forward to going too.      Medications: reviewed  Labs: reviewed  Anthropometrics:   WEight stable at 126 lb today, 125 lb on 3/28.   UBW 150s  NUTRITION DIAGNOSIS: Malnutrition stable   MALNUTRITION DIAGNOSIS: severe malnutrition ongoing   INTERVENTION:   Encouraged patient to take pain medications to help decrease pain and allow him to rest and better meet nutritional needs Encouraged patient to continue appetite stimulant as it has worked in the past.  Stressed importance of good nutrition now to keep him strong so he can enjoy his time in [place]. Patient verbalized understanding.    MONITORING, EVALUATION, GOAL: weight trends, intake   NEXT VISIT: as needed, treatment days have changed to day when RD is not available  Joette Schmoker B. Zenia Resides, [place], [place] Registered Dietitian [phone] (pager)

## 2018-02-20 LAB — METHYLMALONIC ACID, SERUM: Methylmalonic Acid, Quantitative: 161 nmol/L (ref 0–378)

## 2018-02-23 ENCOUNTER — Other Ambulatory Visit: Payer: Self-pay | Admitting: Hematology and Oncology

## 2018-02-23 ENCOUNTER — Inpatient Hospital Stay: Payer: Non-veteran care

## 2018-02-23 VITALS — BP 103/67 | HR 76 | Temp 96.9°F | Resp 20 | Wt 125.6 lb

## 2018-02-23 DIAGNOSIS — C672 Malignant neoplasm of lateral wall of bladder: Secondary | ICD-10-CM

## 2018-02-23 DIAGNOSIS — Z5111 Encounter for antineoplastic chemotherapy: Secondary | ICD-10-CM | POA: Diagnosis not present

## 2018-02-23 DIAGNOSIS — Z95828 Presence of other vascular implants and grafts: Secondary | ICD-10-CM

## 2018-02-23 LAB — CBC WITH DIFFERENTIAL/PLATELET
Basophils Absolute: 0.1 10*3/uL (ref 0–0.1)
Basophils Relative: 2 %
Eosinophils Absolute: 0 10*3/uL (ref 0–0.7)
Eosinophils Relative: 1 %
HCT: 25.7 % — ABNORMAL LOW (ref 40.0–52.0)
Hemoglobin: 9.2 g/dL — ABNORMAL LOW (ref 13.0–18.0)
Lymphocytes Relative: 17 %
Lymphs Abs: 0.7 10*3/uL — ABNORMAL LOW (ref 1.0–3.6)
MCH: 35.9 pg — ABNORMAL HIGH (ref 26.0–34.0)
MCHC: 35.8 g/dL (ref 32.0–36.0)
MCV: 100.2 fL — ABNORMAL HIGH (ref 80.0–100.0)
Monocytes Absolute: 0.3 10*3/uL (ref 0.2–1.0)
Monocytes Relative: 6 %
Neutro Abs: 3 10*3/uL (ref 1.4–6.5)
Neutrophils Relative %: 74 %
Platelets: 169 10*3/uL (ref 150–440)
RBC: 2.57 MIL/uL — ABNORMAL LOW (ref 4.40–5.90)
RDW: 18.7 % — ABNORMAL HIGH (ref 11.5–14.5)
WBC: 4.1 10*3/uL (ref 3.8–10.6)

## 2018-02-23 LAB — COMPREHENSIVE METABOLIC PANEL
ALT: 28 U/L (ref 17–63)
AST: 25 U/L (ref 15–41)
Albumin: 3.2 g/dL — ABNORMAL LOW (ref 3.5–5.0)
Alkaline Phosphatase: 108 U/L (ref 38–126)
Anion gap: 7 (ref 5–15)
BUN: 15 mg/dL (ref 6–20)
CO2: 24 mmol/L (ref 22–32)
Calcium: 8.4 mg/dL — ABNORMAL LOW (ref 8.9–10.3)
Chloride: 98 mmol/L — ABNORMAL LOW (ref 101–111)
Creatinine, Ser: 0.64 mg/dL (ref 0.61–1.24)
GFR calc Af Amer: >60 mL/min (ref >60)
GFR calc non Af Amer: >60 mL/min (ref >60)
Glucose, Bld: 104 mg/dL — ABNORMAL HIGH (ref 65–99)
Potassium: 3.5 mmol/L (ref 3.5–5.1)
Sodium: 129 mmol/L — ABNORMAL LOW (ref 135–145)
Total Bilirubin: 0.4 mg/dL (ref 0.3–1.2)
Total Protein: 6.6 g/dL (ref 6.5–8.1)

## 2018-02-23 LAB — MAGNESIUM: Magnesium: 1.8 mg/dL (ref 1.7–2.4)

## 2018-02-23 MED ORDER — ONDANSETRON HCL 4 MG PO TABS
8.0000 mg | ORAL_TABLET | Freq: Once | ORAL | Status: AC
  Administered 2018-02-23: 8 mg via ORAL
  Filled 2018-02-23: qty 2

## 2018-02-23 MED ORDER — HEPARIN SOD (PORK) LOCK FLUSH 100 UNIT/ML IV SOLN
500.0000 [IU] | Freq: Once | INTRAVENOUS | Status: AC | PRN
  Administered 2018-02-23: 500 [IU]
  Filled 2018-02-23: qty 5

## 2018-02-23 MED ORDER — SODIUM CHLORIDE 0.9 % IV SOLN
1400.0000 mg | Freq: Once | INTRAVENOUS | Status: AC
  Administered 2018-02-23: 1400 mg via INTRAVENOUS
  Filled 2018-02-23: qty 26.3

## 2018-02-23 MED ORDER — SODIUM CHLORIDE 0.9 % IV SOLN
Freq: Once | INTRAVENOUS | Status: AC
  Administered 2018-02-23: 11:00:00 via INTRAVENOUS
  Filled 2018-02-23: qty 1000

## 2018-02-28 ENCOUNTER — Encounter
Admission: RE | Admit: 2018-02-28 | Discharge: 2018-02-28 | Disposition: A | Discharge status: Home / Self Care | Payer: Non-veteran care | Source: Ambulatory Visit | Attending: Hematology and Oncology | Admitting: Hematology and Oncology

## 2018-02-28 DIAGNOSIS — C672 Malignant neoplasm of lateral wall of bladder: Secondary | ICD-10-CM | POA: Insufficient documentation

## 2018-02-28 DIAGNOSIS — C7951 Secondary malignant neoplasm of bone: Secondary | ICD-10-CM | POA: Diagnosis present

## 2018-02-28 MED ORDER — TECHNETIUM TC 99M MEDRONATE IV KIT
20.0000 | PACK | Freq: Once | INTRAVENOUS | Status: AC | PRN
  Administered 2018-02-28: 21.98 via INTRAVENOUS

## 2018-03-02 ENCOUNTER — Telehealth: Payer: Self-pay | Admitting: *Deleted

## 2018-03-02 ENCOUNTER — Other Ambulatory Visit: Payer: Self-pay | Admitting: *Deleted

## 2018-03-02 ENCOUNTER — Inpatient Hospital Stay: Payer: Non-veteran care | Attending: Hematology and Oncology

## 2018-03-02 DIAGNOSIS — Z85118 Personal history of other malignant neoplasm of bronchus and lung: Secondary | ICD-10-CM | POA: Insufficient documentation

## 2018-03-02 DIAGNOSIS — C7951 Secondary malignant neoplasm of bone: Secondary | ICD-10-CM | POA: Diagnosis not present

## 2018-03-02 DIAGNOSIS — R197 Diarrhea, unspecified: Secondary | ICD-10-CM | POA: Insufficient documentation

## 2018-03-02 DIAGNOSIS — Z809 Family history of malignant neoplasm, unspecified: Secondary | ICD-10-CM | POA: Insufficient documentation

## 2018-03-02 DIAGNOSIS — E538 Deficiency of other specified B group vitamins: Secondary | ICD-10-CM | POA: Diagnosis not present

## 2018-03-02 DIAGNOSIS — C679 Malignant neoplasm of bladder, unspecified: Secondary | ICD-10-CM | POA: Diagnosis not present

## 2018-03-02 DIAGNOSIS — E871 Hypo-osmolality and hyponatremia: Secondary | ICD-10-CM | POA: Diagnosis not present

## 2018-03-02 DIAGNOSIS — Z9221 Personal history of antineoplastic chemotherapy: Secondary | ICD-10-CM | POA: Insufficient documentation

## 2018-03-02 DIAGNOSIS — C672 Malignant neoplasm of lateral wall of bladder: Secondary | ICD-10-CM

## 2018-03-02 DIAGNOSIS — I10 Essential (primary) hypertension: Secondary | ICD-10-CM | POA: Insufficient documentation

## 2018-03-02 DIAGNOSIS — I739 Peripheral vascular disease, unspecified: Secondary | ICD-10-CM | POA: Diagnosis not present

## 2018-03-02 DIAGNOSIS — G893 Neoplasm related pain (acute) (chronic): Secondary | ICD-10-CM | POA: Insufficient documentation

## 2018-03-02 DIAGNOSIS — Z7982 Long term (current) use of aspirin: Secondary | ICD-10-CM | POA: Diagnosis not present

## 2018-03-02 DIAGNOSIS — D649 Anemia, unspecified: Secondary | ICD-10-CM | POA: Diagnosis not present

## 2018-03-02 DIAGNOSIS — Z79899 Other long term (current) drug therapy: Secondary | ICD-10-CM | POA: Insufficient documentation

## 2018-03-02 LAB — CBC WITH DIFFERENTIAL/PLATELET
Basophils Absolute: 0 10*3/uL (ref 0–0.1)
Basophils Relative: 1 %
Eosinophils Absolute: 0 10*3/uL (ref 0–0.7)
Eosinophils Relative: 1 %
HCT: 26.8 % — ABNORMAL LOW (ref 40.0–52.0)
Hemoglobin: 9.5 g/dL — ABNORMAL LOW (ref 13.0–18.0)
Lymphocytes Relative: 17 %
Lymphs Abs: 0.8 10*3/uL — ABNORMAL LOW (ref 1.0–3.6)
MCH: 35.6 pg — ABNORMAL HIGH (ref 26.0–34.0)
MCHC: 35.4 g/dL (ref 32.0–36.0)
MCV: 100.6 fL — ABNORMAL HIGH (ref 80.0–100.0)
Monocytes Absolute: 0.8 10*3/uL (ref 0.2–1.0)
Monocytes Relative: 19 %
Neutro Abs: 2.8 10*3/uL (ref 1.4–6.5)
Neutrophils Relative %: 62 %
Platelets: 19 10*3/uL — CL (ref 150–400)
RBC: 2.66 MIL/uL — ABNORMAL LOW (ref 4.40–5.90)
RDW: 18.6 % — ABNORMAL HIGH (ref 11.5–14.5)
WBC: 4.5 10*3/uL (ref 3.8–10.6)

## 2018-03-02 NOTE — Telephone Encounter (Signed)
Critical Platelets - 36  MD notified.

## 2018-03-03 ENCOUNTER — Telehealth: Payer: Self-pay | Admitting: Urgent Care

## 2018-03-03 NOTE — Telephone Encounter (Signed)
Attempted to call patient to discuss need for labs. Platelets low yesterday at 19,000. Patient was to RTC today for labs. He did not come in for repeat lab testing. Dr. Mike Gip is aware.   If patient calls over the weekend, he will need to be advised to come in for outpatient labs. He will need to register through the ED as an outpatient before going over to the lab for his testing. If platelets remain low, he may require admission for platelet transfusion.  Honor Loh, MSN, APRN, FNP-C, CEN Oncology/Hematology Nurse Practitioner  [facility] 03/03/18, 7:09 PM

## 2018-03-05 DIAGNOSIS — F419 Anxiety disorder, unspecified: Secondary | ICD-10-CM | POA: Insufficient documentation

## 2018-03-07 ENCOUNTER — Inpatient Hospital Stay: Payer: Non-veteran care

## 2018-03-07 ENCOUNTER — Inpatient Hospital Stay (HOSPITAL_BASED_OUTPATIENT_CLINIC_OR_DEPARTMENT_OTHER): Payer: Non-veteran care | Admitting: Hematology and Oncology

## 2018-03-07 ENCOUNTER — Encounter: Payer: Self-pay | Admitting: Hematology and Oncology

## 2018-03-07 VITALS — BP 107/74 | HR 102 | Temp 96.8°F | Resp 18 | Wt 124.4 lb

## 2018-03-07 DIAGNOSIS — C7951 Secondary malignant neoplasm of bone: Secondary | ICD-10-CM

## 2018-03-07 DIAGNOSIS — Z5111 Encounter for antineoplastic chemotherapy: Secondary | ICD-10-CM

## 2018-03-07 DIAGNOSIS — Z7982 Long term (current) use of aspirin: Secondary | ICD-10-CM

## 2018-03-07 DIAGNOSIS — Z9221 Personal history of antineoplastic chemotherapy: Secondary | ICD-10-CM

## 2018-03-07 DIAGNOSIS — R197 Diarrhea, unspecified: Secondary | ICD-10-CM

## 2018-03-07 DIAGNOSIS — Z809 Family history of malignant neoplasm, unspecified: Secondary | ICD-10-CM

## 2018-03-07 DIAGNOSIS — G893 Neoplasm related pain (acute) (chronic): Secondary | ICD-10-CM

## 2018-03-07 DIAGNOSIS — E871 Hypo-osmolality and hyponatremia: Secondary | ICD-10-CM | POA: Diagnosis not present

## 2018-03-07 DIAGNOSIS — D649 Anemia, unspecified: Secondary | ICD-10-CM

## 2018-03-07 DIAGNOSIS — Z79899 Other long term (current) drug therapy: Secondary | ICD-10-CM

## 2018-03-07 DIAGNOSIS — I739 Peripheral vascular disease, unspecified: Secondary | ICD-10-CM

## 2018-03-07 DIAGNOSIS — R634 Abnormal weight loss: Secondary | ICD-10-CM

## 2018-03-07 DIAGNOSIS — C672 Malignant neoplasm of lateral wall of bladder: Secondary | ICD-10-CM

## 2018-03-07 DIAGNOSIS — C679 Malignant neoplasm of bladder, unspecified: Secondary | ICD-10-CM

## 2018-03-07 DIAGNOSIS — Z85118 Personal history of other malignant neoplasm of bronchus and lung: Secondary | ICD-10-CM

## 2018-03-07 DIAGNOSIS — E538 Deficiency of other specified B group vitamins: Secondary | ICD-10-CM

## 2018-03-07 DIAGNOSIS — I10 Essential (primary) hypertension: Secondary | ICD-10-CM

## 2018-03-07 LAB — COMPREHENSIVE METABOLIC PANEL
ALT: 10 U/L — ABNORMAL LOW (ref 17–63)
AST: 19 U/L (ref 15–41)
Albumin: 3 g/dL — ABNORMAL LOW (ref 3.5–5.0)
Alkaline Phosphatase: 116 U/L (ref 38–126)
Anion gap: 10 (ref 5–15)
BUN: 10 mg/dL (ref 6–20)
CO2: 21 mmol/L — ABNORMAL LOW (ref 22–32)
Calcium: 8.4 mg/dL — ABNORMAL LOW (ref 8.9–10.3)
Chloride: 98 mmol/L — ABNORMAL LOW (ref 101–111)
Creatinine, Ser: 0.67 mg/dL (ref 0.61–1.24)
GFR calc Af Amer: >60 mL/min (ref >60)
GFR calc non Af Amer: >60 mL/min (ref >60)
Glucose, Bld: 99 mg/dL (ref 65–99)
Potassium: 3.7 mmol/L (ref 3.5–5.1)
Sodium: 129 mmol/L — ABNORMAL LOW (ref 135–145)
Total Bilirubin: 0.4 mg/dL (ref 0.3–1.2)
Total Protein: 6.6 g/dL (ref 6.5–8.1)

## 2018-03-07 LAB — CBC WITH DIFFERENTIAL/PLATELET
Basophils Absolute: 0.1 10*3/uL (ref 0–0.1)
Basophils Relative: 1 %
Eosinophils Absolute: 0.2 10*3/uL (ref 0–0.7)
Eosinophils Relative: 2 %
HCT: 28.1 % — ABNORMAL LOW (ref 40.0–52.0)
Hemoglobin: 9.9 g/dL — ABNORMAL LOW (ref 13.0–18.0)
Lymphocytes Relative: 12 %
Lymphs Abs: 1.1 10*3/uL (ref 1.0–3.6)
MCH: 35.3 pg — ABNORMAL HIGH (ref 26.0–34.0)
MCHC: 35.2 g/dL (ref 32.0–36.0)
MCV: 100.5 fL — ABNORMAL HIGH (ref 80.0–100.0)
Monocytes Absolute: 1.5 10*3/uL — ABNORMAL HIGH (ref 0.2–1.0)
Monocytes Relative: 17 %
Neutro Abs: 6.4 10*3/uL (ref 1.4–6.5)
Neutrophils Relative %: 68 %
Platelets: 172 10*3/uL (ref 150–440)
RBC: 2.8 MIL/uL — ABNORMAL LOW (ref 4.40–5.90)
RDW: 18.7 % — ABNORMAL HIGH (ref 11.5–14.5)
WBC: 9.2 10*3/uL (ref 3.8–10.6)

## 2018-03-07 LAB — MAGNESIUM: Magnesium: 1.8 mg/dL (ref 1.7–2.4)

## 2018-03-07 MED ORDER — CYANOCOBALAMIN 1000 MCG/ML IJ SOLN
1000.0000 ug | Freq: Once | INTRAMUSCULAR | Status: AC
  Administered 2018-03-07: 1000 ug via INTRAMUSCULAR
  Filled 2018-03-07: qty 1

## 2018-03-07 MED ORDER — SODIUM CHLORIDE 0.9% FLUSH
10.0000 mL | Freq: Once | INTRAVENOUS | Status: AC
  Administered 2018-03-07: 10 mL via INTRAVENOUS
  Filled 2018-03-07: qty 10

## 2018-03-07 MED ORDER — MORPHINE SULFATE ER 15 MG PO TBCR: 15.0000 mg | EXTENDED_RELEASE_TABLET | Freq: Two times a day (BID) | ORAL | 0 refills | Status: DC

## 2018-03-07 MED ORDER — HEPARIN SOD (PORK) LOCK FLUSH 100 UNIT/ML IV SOLN
500.0000 [IU] | Freq: Once | INTRAVENOUS | Status: AC
  Administered 2018-03-07: 500 [IU] via INTRAVENOUS

## 2018-03-07 MED ORDER — HEPARIN SOD (PORK) LOCK FLUSH 100 UNIT/ML IV SOLN
INTRAVENOUS | Status: AC
  Filled 2018-03-07: qty 5

## 2018-03-07 NOTE — Progress Notes (Addendum)
[facility] day:  03/07/2018   Chief Complaint: Patrick Hooper is a 72 y.o. male with metastatic bladder cancer who is seen for assessment prior to day 1 of cycle #7 of carboplatin and gemcitabine and monthly Xgeva.  HPI:  The patient was last seen in the medical oncology clinic on 02/16/2018.  At that time,  he felt "ok".  He described intermittent shortness of breath and positional vertigo.  He was using Tylenol for pain secondary to opioid induced constipation.  Pain was well controlled.  WBC was 7300 with an [place] 5000.  Hemoglobin was 9.9, hematocrit 27.6, and platelets 334,000.  Calcium was 9.0. Sodium was 129 (low). Potassium was 3.7.   Magnesium was 1.7.  He received Xgeva.  He began cycle #6 carboplatin and gemcitabine (80% dosing).  He received day 8 gemcitabine on 02/23/2018.    CBC on 02/23/2018:  Hematocrit 25.7, hemoglobin 9.2, platelets 169,000, WBC 4100 with an ANC of 3000. CBC on 03/02/2018:  Hematocrit 26.8, hemoglobin 9.5, platelets 19,000, WBC 4500 with an ANC of 2800.  Patient was contacted for follow-up platelet count on 03/03/2018.  He was unable to be reached.  Bone scan on 02/28/2018 revealed multifocal areas of increased uptake involving the thoracic and lumbar spine, bony pelvis, right ribs, and right scapula.  Areas of uptake generally corresponded to the FDG avid lesions on the PET scan.  During the interim, patient had diarrhea. Diarrhea lasted 3-4 days after eating at [employer]. Significant other and dog also experienced symptoms after eating chicken from this restaurant. Diarrhea resolved yesterday. Patient has had a poor appetite because of the diarrhea. Weight is down 2 pounds.   Lortab recently started giving patient vivid "bad dreams". He describes a "spiraling airplane on fire" coming towards his truck. He notes that APAP is not working as well as it has in the past. Patient has significant pain mainly at bedtime. He  has done well on MS Contin 15 mg BID.  Pain is rated 4/10 in the clinic today.   Patient denies B symptoms and interval infections.    Past Medical History:  Diagnosis Date  . Bladder cancer ([place])   . Hypertension   . Lung cancer ([place]) 2016  . PVD (peripheral vascular disease) ([place])     Past Surgical History:  Procedure Laterality Date  . BLADDER SURGERY  08/12/2017  . BYPASS GRAFT FEMORAL/POPLITEAL W/VEIN  2009  . COLONOSCOPY  2013  . CYSTOSCOPY  08/12/2017  . IR ABDOMINAL AORTOBIFEMORAL CATHETER SERIALOGRAM  2009  . LOBECTOMY Right 09/2015  . PACEMAKER INSERTION  2003  . PORTA CATH INSERTION N/A 11/21/2017   Procedure: PORTA CATH INSERTION;  Surgeon: Algernon Huxley, MD;  Location: [facility];  Service: Cardiovascular;  Laterality: N/A;    Family History  Problem Relation Age of Onset  . Cancer Mother   . Cancer Sister   . Cancer Maternal Grandmother     Social History:  reports that he has been smoking cigarettes.  He has a 55.00 pack-year smoking history. He has never used smokeless tobacco. He reports that he drinks alcohol. He reports that he does not use drugs.  He smokes 1 pack a day for > 55 years.  He used to drink 4 alcoholic beverages/day.  Now his alcohol intake is infrequent. 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 There is the potential of past exposure to radiation  and/or toxins. He is registered with the [place] regarding his exposures.  He lives in [place].  His girlfriend's cell number is [phone].  The patient is accompanied by his girlfriend Optometrist) today.  Allergies:  Allergies  Allergen Reactions  . Cefazolin Rash and Shortness Of Breath  . Carvedilol Other (See Comments)    fatigue  . Chlorthalidone Other (See Comments)    Other reaction(s): Unknown  . Hydralazine     Other reaction(s): Unknown  . Hydrochlorothiazide Other (See Comments)    Current Medications: Current Outpatient  Medications  Medication Sig Dispense Refill  . acetaminophen (TYLENOL) 500 MG tablet Take 500 mg by mouth every 6 (six) hours as needed for mild pain.     Marland Kitchen albuterol (PROVENTIL HFA) 108 (90 Base) MCG/ACT inhaler Inhale 2 puffs into the lungs every 6 (six) hours as needed.    . ALPRAZolam (XANAX) 0.25 MG tablet Take 1 tablet (0.25 mg total) by mouth 2 (two) times daily as needed for anxiety. 30 tablet 0  . aspirin EC 81 MG tablet Take 81 mg by mouth at bedtime.    . calcium carbonate 1250 MG capsule Take 1,250 mg by mouth daily.    . cholecalciferol (VITAMIN D) 400 units TABS tablet Take 800 Units by mouth daily.    . clopidogrel (PLAVIX) 75 MG tablet Take 1 tablet (75 mg total) by mouth at bedtime. 90 tablet 1  . docusate sodium (COLACE) 100 MG capsule Take 200 mg by mouth daily.     Marland Kitchen gabapentin (NEURONTIN) 100 MG capsule Take 1 capsule (100 mg total) by mouth at bedtime. 30 capsule 3  . HYDROcodone-acetaminophen (LORTAB) 5-325 MG tablet Take 1 tablet by mouth every 6 (six) hours as needed for moderate pain. 30 tablet 0  . magnesium oxide (MAG-OX) 400 (241.3 Mg) MG tablet Take 1 tablet (400 mg total) by mouth daily. 30 tablet 1  . polyethylene glycol (MIRALAX / GLYCOLAX) packet Take 17 g by mouth daily. 30 each 0  . potassium chloride SA (K-DUR,KLOR-CON) 20 MEQ tablet Take 1 tablet (20 mEq total) by mouth daily. 90 tablet 1  . dexamethasone (DECADRON) 4 MG tablet Take 2 tablets (8 mg total) by mouth daily. Start the day after carboplatin chemotherapy for 2 days. (Patient not taking: Reported on 02/16/2018) 30 tablet 1  . lactulose (CHRONULAC) 10 GM/15ML solution Take 45 mLs (30 g total) by mouth 2 (two) times daily as needed for mild constipation or severe constipation. (Patient not taking: Reported on 01/05/2018) 1892 mL 0  . lidocaine-prilocaine (EMLA) cream Apply 1 application topically PRN 1 hour prior to procedure. Cover with press and seal until treatment. (Patient not taking: Reported on  01/10/2018) 30 g 0  . LORazepam (ATIVAN) 0.5 MG tablet Take 1 tablet (0.5 mg total) by mouth every 6 (six) hours as needed (Nausea or vomiting). (Patient not taking: Reported on 02/16/2018) 30 tablet 0  . megestrol (MEGACE ORAL) 40 MG/ML suspension Take 5 mLs (200 mg total) by mouth daily. (Patient not taking: Reported on 01/10/2018) 240 mL 0  . morphine (MS CONTIN) 15 MG 12 hr tablet Take 1 tablet (15 mg total) by mouth every 12 (twelve) hours. (Patient not taking: Reported on 01/05/2018) 30 tablet 0  . ondansetron (ZOFRAN) 4 MG tablet Take 1 tablet (4 mg total) by mouth every 6 (six) hours as needed for nausea. (Patient not taking: Reported on 01/10/2018) 20 tablet 0  . oxycodone (OXY-IR) 5 MG capsule Take 1 capsule (5 mg  total) by mouth every 4 (four) hours as needed for pain. (Patient not taking: Reported on 01/05/2018) 30 capsule 0  . protein supplement shake (PREMIER PROTEIN) LIQD Take 325 mLs (11 oz total) by mouth 2 (two) times daily between meals. (Patient not taking: Reported on 01/05/2018) 60 Can 0   No current facility-administered medications for this visit.    Facility-Administered Medications Ordered in Other Visits  Medication Dose Route Frequency Provider Last Rate Last Dose  . 0.9 %  sodium chloride infusion   Intravenous Continuous Verlon Au, NP 500 mL/hr at 10/31/17 1400    . heparin lock flush 100 unit/mL  500 Units Intravenous Once Lequita Asal, MD        Review of Systems  Constitutional: Positive for malaise/fatigue and weight loss (poor appetite due to recent diarrheal illness; weight down 2 pounds). Negative for diaphoresis and fever.  HENT: Negative.   Eyes: Negative.   Respiratory: Positive for shortness of breath (exertional). Negative for cough, hemoptysis and sputum production.   Cardiovascular: Negative for chest pain, palpitations, orthopnea, leg swelling and PND.       Implanted pacemaker - not MRI compatible  Gastrointestinal: Positive for diarrhea  (resolving). Negative for abdominal pain, blood in stool, constipation, melena, nausea and vomiting.  Genitourinary: Negative for dysuria, frequency, hematuria and urgency.       Urinary dribbling  Musculoskeletal: Positive for back pain, joint pain and neck pain. Negative for falls and myalgias.  Skin: Negative for itching and rash.  Neurological: Positive for dizziness (with position changes). Negative for tremors, weakness and headaches.  Endo/Heme/Allergies: Does not bruise/bleed easily.  Psychiatric/Behavioral: Negative for depression, memory loss and suicidal ideas. The patient is not nervous/anxious and does not have insomnia.   All other systems reviewed and are negative.  Physical Exam: Blood pressure 107/74, pulse (!) 102, temperature (!) 96.8 F (36 C), temperature source Tympanic, resp. rate 18, weight 124 lb 7 oz (56.4 kg). GENERAL:  Thin gentleman sitting comfortably in the exam room in no acute distress. MENTAL STATUS:  Alert and oriented to person, place and time. HEAD:  Thin gray hair.  Normocephalic, atraumatic, face symmetric, no Cushingoid features. EYES:  Blue eyes.  Pupils equal round and reactive to light and accomodation.  No conjunctivitis or scleral icterus. ENT:  Oropharynx clear without lesion.  Tongue normal. Upper dentures.  No lower teeth.  Mucous membranes moist.  RESPIRATORY:  Clear to auscultation without rales, wheezes or rhonchi. CARDIOVASCULAR:  Regular rate and rhythm without murmur, rub or gallop. CHEST WALL:  Left sided pacemaker. ABDOMEN:  Soft, non-tender, with active bowel sounds, and no hepatosplenomegaly.  No masses. SKIN:  Nicotine stained nails.  No rashes, ulcers or lesions. EXTREMITIES: No edema, no skin discoloration or tenderness.  No palpable cords. LYMPH NODES: No palpable cervical, supraclavicular, axillary or inguinal adenopathy  NEUROLOGICAL: Unremarkable. PSYCH:  Appropriate.    Infusion on 03/07/2018  Component Date Value Ref  Range Status  . Magnesium 03/07/2018 1.8  1.7 - 2.4 mg/dL Final   Performed at [facility], [address] [place], [place] [address]  . Sodium 03/07/2018 129* 135 - 145 mmol/L Final  . Potassium 03/07/2018 3.7  3.5 - 5.1 mmol/L Final  . Chloride 03/07/2018 98* 101 - 111 mmol/L Final  . CO2 03/07/2018 21* 22 - 32 mmol/L Final  . Glucose, Bld 03/07/2018 99  65 - 99 mg/dL Final  . BUN 03/07/2018 10  6 - 20 mg/dL Final  . Creatinine,  Ser 03/07/2018 0.67  0.61 - 1.24 mg/dL Final  . Calcium 03/07/2018 8.4* 8.9 - 10.3 mg/dL Final  . Total Protein 03/07/2018 6.6  6.5 - 8.1 g/dL Final  . Albumin 03/07/2018 3.0* 3.5 - 5.0 g/dL Final  . AST 03/07/2018 19  15 - 41 U/L Final  . ALT 03/07/2018 10* 17 - 63 U/L Final  . Alkaline Phosphatase 03/07/2018 116  38 - 126 U/L Final  . Total Bilirubin 03/07/2018 0.4  0.3 - 1.2 mg/dL Final  . GFR calc non Af Amer 03/07/2018 >60  >60 mL/min Final  . GFR calc Af Amer 03/07/2018 >60  >60 mL/min Final   Comment: (NOTE) The eGFR has been calculated using the CKD EPI equation. This calculation has not been validated in all clinical situations. eGFR's persistently <60 mL/min signify possible Chronic Kidney Disease.   Georgiann Hahn gap 03/07/2018 10  5 - 15 Final   Performed at [facility], [address] [place], [place] [address]  . WBC 03/07/2018 9.2  3.8 - 10.6 K/uL Final  . RBC 03/07/2018 2.80* 4.40 - 5.90 MIL/uL Final  . Hemoglobin 03/07/2018 9.9* 13.0 - 18.0 g/dL Final  . HCT 03/07/2018 28.1* 40.0 - 52.0 % Final  . MCV 03/07/2018 100.5* 80.0 - 100.0 fL Final  . MCH 03/07/2018 35.3* 26.0 - 34.0 pg Final  . MCHC 03/07/2018 35.2  32.0 - 36.0 g/dL Final  . RDW 03/07/2018 18.7* 11.5 - 14.5 % Final  . Platelets 03/07/2018 172  150 - 440 K/uL Final  . Neutrophils Relative % 03/07/2018 68  % Final  . Neutro Abs 03/07/2018 6.4  1.4 - 6.5 K/uL Final  . Lymphocytes Relative 03/07/2018 12  % Final  . Lymphs Abs 03/07/2018 1.1  1.0 - 3.6 K/uL Final  .  Monocytes Relative 03/07/2018 17  % Final  . Monocytes Absolute 03/07/2018 1.5* 0.2 - 1.0 K/uL Final  . Eosinophils Relative 03/07/2018 2  % Final  . Eosinophils Absolute 03/07/2018 0.2  0 - 0.7 K/uL Final  . Basophils Relative 03/07/2018 1  % Final  . Basophils Absolute 03/07/2018 0.1  0 - 0.1 K/uL Final   Performed at [facility], [address] [place], [place] [address]    Assessment:  Peirce Deveney is a 72 y.o. male with metastatic disease.  He has a recent history of muscle invasive bladder cancer and a distant history of lung cancer.  He has a history of stage IB lung cancer status post right upper lobe lobectomy in 09/2015.  Pathology revealed a T2aN0M0 adenocarcinoma.  Chest CT on 05/25/2017 revealed postoperative changes from a right upper lobe lobectomy without evidence of recurrence. There was new diffuse punctate sclerotic osseous lesions throughout the chest concerning for metastatic disease.  PET scan on 06/08/2017 at the [facility] revealed right upper lobe lobectomy without evidence of recurrence. There was a new 3.2 cm mass along the right inferior bladder wall, anterior to the ureteral vesicular junction.  There were multiple new focal sclerotic and some probable mixed lytic and sclerotic osseous lesions in the axial skeleton many of which demonstrated increased FDG avidity. Differential diagnoses included include metastatic lung cancer, bladder cancer or prostate cancer. There was increased FDG uptake in the left medial thigh musculature of uncertain etiology.  PET scan on 09/09/2017 revealed several hypermetabolic mixed lytic and sclerotic osseous metastases throughout the axial skeleton.  There was suspected intramuscular soft tissue metastases in the right quadratus lumborum and posterior left lumbar paraspinal  musculature.  There was mildly hypermetabolic retroperitoneal lymphadenopathy, probably representing nodal metastases.  There was diffuse irregular  bladder wall thickening, asymmetrically prominent in the right bladder wall, suspicious for residual/recurrent bladder neoplasm.   There was nonspecific small ground-glass nodular opacity in the left upper lung lobe with low level metabolism.  There was no evidence of local tumor recurrence in the right lung status post right upper lobectomy   He underwent transurethral resection of bladder tumor (TURBT) with bilateral retrograde pyelogram on 08/12/2017.  He had a 3 cm bladder tumor on the right lateral wall with some sessile and papillary features.  Retrograde pyelograms were normal without filling defect.  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  He began monthly Xgeva on 09/16/2017 (last 02/16/2018).  He is s/p 6 cycles of carboplatin and gemcitabine (11/03/2017 - 02/16/2018).  Cycle #1 was complicated by orthostatic hypotension and fatigue requiring IVF and delay in day 8 gemcitabine (80% dose).  Cycle #2 was complicated by fecal impaction.  He did not receive gemcitabine of day 8 (WBC 1800 with an ANC of 1000).  Nadir platelet count was 23,000 with cycle #3, 19,000 with cycle #4, 31,000 with cycle #5, and 19,000 with cycle #6.   PET scan on 01/23/2018 revealed a mixed but generally positive response to therapy, most typified by decreased hypermetabolism throughout the numerous musculoskeletal metastases. While there were several new and enlarged sclerotic osseous lesions, most of these demonstrated no hypermetabolism, presumably treated metastatic lesions.  There was nonenlarged but mildly hypermetabolic right level 5B lymph node in the lower right cervical region. There were several other nonenlarged non hypermetabolic lymph nodes  adjacent to this area., uncertain etiology and significance.  Bone scan on 02/28/2018 revealed multifocal areas of increased uptake involving the thoracic and lumbar spine, bony pelvis, right ribs, and right scapula.    Areas of uptake generally corresponded to the FDG avid lesions on the PET scan.  He has had issues with hypotension despite a history of hypertension.  He is off blood pressure medications.  Cortisol level was normal on 10/27/2017.  He has a normocytic anemia.  Work-up on 11/24/2017 revealed a normal ferritin, iron studies, folate.  B12 was 243 (low) and methylmalonic acid 151 (normal).  B12 was 440 on 02/16/2018.  He has a history of peripheral vascular disease s/p fem-fem bypass in 2016. He is on Plavix.  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  Colonoscopy was normal in 2011.  Symptomatically, he had a recent episode of diarrhea that was likely of food borne etiology.  Appetite was poor due to acute diarrheal illness. He lost 2 pounds.  He takes Tylenol for pain, however he has been having more pain.  Lortab causes vivid dreams.  Exam is stable. WBC is 9200 ([place] 6400).  Hemoglobin is 9.9, hematocrit 28.1, and platelets 172,000.  Sodium is 129 (low). Potassium is 3.7.   Magnesium is 1.8.  Plan: 1.   Labs today:  CBC with diff, CMP, Mg. 2.   Discuss interval bone scan- lesions correspond with PET scan.  No new lesions. 3.   Discuss recovery of counts.  Discuss patient's thoughts about next cycle of chemotherapy.  Patient would like to postpone until 04/04/2018 (upcoming trip). 4.   Discuss pain control. Experiences significant pain at bedtime. Given a current oncological diagnosis, this patient has the potential to experience significant cancer related pain. Pain 4/10 in clinic today. Benefits versus risks associated  with continued therapy considered. Will continue pain management with opioids as previously prescribed. Patient educated that medications should not be bitten, chewed, or crushed. Additionally, safety precautions reviewed. Patient verbalized understanding that medications should not be sold or shared, taken with alcohol, or used while driving.   a. Refill:  MS Contin 15 mg BID (Disp #30).  5.   Discuss electrolytes.  Patient has hyponatremia.  Encourage fluids with electrolytes and added salt.  Patient states he won't drink gatorade. Continue current oral potassium. 6.   Continue calcium and vitamin D. 7.   B12 injection today, then monthly.  8.   Continue Xgeva monthly (due 03/16/2018). 9.   RTC on 04/04/2018 for MD assessment, labs (CBC with diff, CMP, Mg), B12 injection, and day 1 of cycle #7 carboplatin + gemcitabine. 10.   RTC on 04/11/2018 for MD assessment, labs (CBC with diff, CMP, Mg), and day 8 of cycle #7 gemcitabine.  Honor Loh, NP  03/07/2018, 11:21 AM   I saw and evaluated the patient, participating in the key portions of the service and reviewing pertinent diagnostic studies and records.  I reviewed the nurse practitioner's note and agree with the findings and the plan.  The assessment and plan were discussed with the patient.  Multiple questions were asked by the patient and answered.   Nolon Stalls, MD 03/07/2018,11:21 AM

## 2018-03-07 NOTE — Progress Notes (Signed)
Patient states he had about 3 days of diarrhea and during that time his appetite was not good.  Otherwise, no complaints.

## 2018-03-09 ENCOUNTER — Telehealth: Payer: Self-pay | Admitting: *Deleted

## 2018-03-09 ENCOUNTER — Ambulatory Visit (INDEPENDENT_AMBULATORY_CARE_PROVIDER_SITE_OTHER): Payer: Medicare Other | Admitting: Urology

## 2018-03-09 ENCOUNTER — Encounter: Payer: Self-pay | Admitting: Urology

## 2018-03-09 VITALS — BP 100/67 | HR 123 | Ht 71.0 in | Wt 121.3 lb

## 2018-03-09 DIAGNOSIS — R39198 Other difficulties with micturition: Secondary | ICD-10-CM

## 2018-03-09 DIAGNOSIS — C679 Malignant neoplasm of bladder, unspecified: Secondary | ICD-10-CM

## 2018-03-09 DIAGNOSIS — R300 Dysuria: Secondary | ICD-10-CM | POA: Diagnosis not present

## 2018-03-09 LAB — BLADDER SCAN AMB NON-IMAGING

## 2018-03-09 MED ORDER — SULFAMETHOXAZOLE-TRIMETHOPRIM 800-160 MG PO TABS: 1.0000 | ORAL_TABLET | Freq: Two times a day (BID) | ORAL | 0 refills | Status: DC

## 2018-03-09 NOTE — Telephone Encounter (Signed)
Mardene Celeste called to report that patient does not feel he is able to empty his bladder and is having the feeling of having to go all the time. He has not seen Urology in a while and she could not remember who he had seen before. I checked his chart and see that Dr Bernardo Heater had ordered UA on him in past and I gave her his contact information. She will call and see if she can get him an appointment.   Mardene Celeste called back and states that they gave him an appointment for this afternoon at 3 PM and that his office in [place]. She thanked me for helping.

## 2018-03-10 ENCOUNTER — Ambulatory Visit (INDEPENDENT_AMBULATORY_CARE_PROVIDER_SITE_OTHER): Payer: Medicare Other | Admitting: Family Medicine

## 2018-03-10 ENCOUNTER — Other Ambulatory Visit: Payer: Self-pay | Admitting: Family Medicine

## 2018-03-10 VITALS — BP 95/65 | HR 120

## 2018-03-10 DIAGNOSIS — R39198 Other difficulties with micturition: Secondary | ICD-10-CM

## 2018-03-10 DIAGNOSIS — R208 Other disturbances of skin sensation: Secondary | ICD-10-CM

## 2018-03-10 LAB — URINALYSIS, COMPLETE
Bilirubin, UA: NEGATIVE
Glucose, UA: NEGATIVE
Nitrite, UA: POSITIVE — AB
Urobilinogen, Ur: 4 mg/dL — ABNORMAL HIGH (ref 0.2–1.0)
pH, UA: 5.5 (ref 5.0–7.5)

## 2018-03-10 LAB — MICROSCOPIC EXAMINATION

## 2018-03-10 NOTE — Progress Notes (Signed)
Simple Catheter Placement  Due to urinary retention patient is present today for a foley cath placement.  Patient was cleaned and prepped in a sterile fashion with betadine and lidocaine jelly 2% was instilled into the urethra.  A 16 FR foley catheter was inserted, urine return was noted  2101ml, urine was dark yellow in color.  The balloon was filled with 10cc of sterile water.  A leg bag was attached for drainage. Patient was also given a night bag to take home and was given instruction on how to change from one bag to another.  Patient was given instruction on proper catheter care.  Patient tolerated well, no complications were noted   Preformed by: Elberta Leatherwood, CMA  Additional notes/ Follow up: 5 days for Nurse visit cath removal

## 2018-03-11 NOTE — Progress Notes (Signed)
03/09/2018 4:53 PM   Patrick Hooper [birth year] [id number]  Referring provider: Elby Beck, [address] [place], [place] [address]  Chief Complaint  Patient presents with  . Urine Output    Pt feels like he is unable to urinate, only "dribbling"     HPI: 72 year old male with metastatic urothelial carcinoma the bladder currently undergoing chemotherapy with carboplatinum and gemcitabine and oncology.  He presents today with a 2-day history of urinary frequency, urgency, dysuria, urgent continence and an intermittent urinary stream.  He denies fever, chills or gross hematuria.   PMH: Past Medical History:  Diagnosis Date  . Bladder cancer ([place])   . Hypertension   . Lung cancer ([place]) 2016  . PVD (peripheral vascular disease) [facility])     Surgical History: Past Surgical History:  Procedure Laterality Date  . BLADDER SURGERY  08/12/2017  . BYPASS GRAFT FEMORAL/POPLITEAL W/VEIN  2009  . COLONOSCOPY  2013  . CYSTOSCOPY  08/12/2017  . IR ABDOMINAL AORTOBIFEMORAL CATHETER SERIALOGRAM  2009  . LOBECTOMY Right 09/2015  . PACEMAKER INSERTION  2003  . PORTA CATH INSERTION N/A 11/21/2017   Procedure: PORTA CATH INSERTION;  Surgeon: Algernon Huxley, MD;  Location: [place] [facility];  Service: Cardiovascular;  Laterality: N/A;    Home Medications:  Allergies as of 03/09/2018      Reactions   Cefazolin Rash, Shortness Of Breath   Carvedilol Other (See Comments)   fatigue   Chlorthalidone Other (See Comments)   Other reaction(s): Unknown   Hydralazine    Other reaction(s): Unknown   Hydrochlorothiazide Other (See Comments)   Lortab [hydrocodone-acetaminophen]    Vivid dreaming      Medication List        Accurate as of 03/09/18 11:59 PM. Always use your most recent med list.          acetaminophen 500 MG tablet Commonly known as:  TYLENOL Take 500 mg by mouth every 6 (six) hours as needed for mild pain.   ALPRAZolam 0.25 MG tablet Commonly known  as:  XANAX Take 1 tablet (0.25 mg total) by mouth 2 (two) times daily as needed for anxiety.   aspirin EC 81 MG tablet Take 81 mg by mouth at bedtime.   calcium carbonate 1250 MG capsule Take 1,250 mg by mouth daily.   cholecalciferol 400 units Tabs tablet Commonly known as:  VITAMIN D Take 800 Units by mouth daily.   clopidogrel 75 MG tablet Commonly known as:  PLAVIX Take 1 tablet (75 mg total) by mouth at bedtime.   dexamethasone 4 MG tablet Commonly known as:  DECADRON Take 2 tablets (8 mg total) by mouth daily. Start the day after carboplatin chemotherapy for 2 days.   docusate sodium 100 MG capsule Commonly known as:  COLACE Take 200 mg by mouth daily.   gabapentin 100 MG capsule Commonly known as:  NEURONTIN Take 1 capsule (100 mg total) by mouth at bedtime.   lactulose 10 GM/15ML solution Commonly known as:  CHRONULAC Take 45 mLs (30 g total) by mouth 2 (two) times daily as needed for mild constipation or severe constipation.   lidocaine-prilocaine cream Commonly known as:  EMLA Apply 1 application topically PRN 1 hour prior to procedure. Cover with press and seal until treatment.   LORazepam 0.5 MG tablet Commonly known as:  ATIVAN Take 1 tablet (0.5 mg total) by mouth every 6 (six) hours as needed (Nausea or vomiting).   magnesium oxide 400 (241.3  Mg) MG tablet Commonly known as:  MAG-OX Take 1 tablet (400 mg total) by mouth daily.   megestrol 40 MG/ML suspension Commonly known as:  MEGACE ORAL Take 5 mLs (200 mg total) by mouth daily.   morphine 15 MG 12 hr tablet Commonly known as:  MS CONTIN Take 1 tablet (15 mg total) by mouth every 12 (twelve) hours.   ondansetron 4 MG tablet Commonly known as:  ZOFRAN Take 1 tablet (4 mg total) by mouth every 6 (six) hours as needed for nausea.   oxycodone 5 MG capsule Commonly known as:  OXY-IR Take 1 capsule (5 mg total) by mouth every 4 (four) hours as needed for pain.   polyethylene glycol  packet Commonly known as:  MIRALAX / GLYCOLAX Take 17 g by mouth daily.   potassium chloride SA 20 MEQ tablet Commonly known as:  K-DUR,KLOR-CON Take 1 tablet (20 mEq total) by mouth daily.   protein supplement shake Liqd Commonly known as:  PREMIER PROTEIN Take 325 mLs (11 oz total) by mouth 2 (two) times daily between meals.   PROVENTIL HFA 108 (90 Base) MCG/ACT inhaler Generic drug:  albuterol Inhale 2 puffs into the lungs every 6 (six) hours as needed.   sulfamethoxazole-trimethoprim 800-160 MG tablet Commonly known as:  BACTRIM DS,SEPTRA DS Take 1 tablet by mouth every 12 (twelve) hours.       Allergies:  Allergies  Allergen Reactions  . Cefazolin Rash and Shortness Of Breath  . Carvedilol Other (See Comments)    fatigue  . Chlorthalidone Other (See Comments)    Other reaction(s): Unknown  . Hydralazine     Other reaction(s): Unknown  . Hydrochlorothiazide Other (See Comments)  . Lortab [Hydrocodone-Acetaminophen]     Vivid dreaming    Family History: Family History  Problem Relation Age of Onset  . Cancer Mother   . Cancer Sister   . Cancer Maternal Grandmother     Social History:  reports that he has been smoking cigarettes.  He has a 55.00 pack-year smoking history. He has never used smokeless tobacco. He reports that he drinks alcohol. He reports that he does not use drugs.  ROS: UROLOGY Frequent Urination?: Yes Hard to postpone urination?: Yes Burning/pain with urination?: Yes Get up at night to urinate?: Yes Leakage of urine?: Yes Urine stream starts and stops?: Yes Trouble starting stream?: No Do you have to strain to urinate?: No Blood in urine?: No Urinary tract infection?: No Sexually transmitted disease?: No Injury to kidneys or bladder?: No Painful intercourse?: No Weak stream?: Yes Erection problems?: No Penile pain?: No  Gastrointestinal Nausea?: No Vomiting?: No Indigestion/heartburn?: No Diarrhea?: No Constipation?:  No  Constitutional Fever: No Night sweats?: No Weight loss?: Yes Fatigue?: Yes  Skin Skin rash/lesions?: No Itching?: No  Eyes Blurred vision?: No Double vision?: No  Ears/Nose/Throat Sore throat?: No Sinus problems?: No  Hematologic/Lymphatic Swollen glands?: No Easy bruising?: Yes  Cardiovascular Leg swelling?: No Chest pain?: No  Respiratory Cough?: No Shortness of breath?: Yes  Endocrine Excessive thirst?: No  Musculoskeletal Back pain?: Yes Joint pain?: No  Neurological Headaches?: No Dizziness?: No  Psychologic Depression?: No Anxiety?: Yes  Physical Exam: BP 100/67 (BP Location: Right Arm, Patient Position: Sitting, Cuff Size: Normal)   Pulse (!) 123   Ht 5\' 11"  (1.803 m)   Wt 121 lb 4.8 oz (55 kg)   SpO2 99%   BMI 16.92 kg/m   Constitutional:  Alert and oriented, No acute distress. HEENT: [place] AT, moist  mucus membranes.  Trachea midline, no masses. Cardiovascular: No clubbing, cyanosis, or edema. Respiratory: Normal respiratory effort, no increased work of breathing. GI: Abdomen is soft, nontender, nondistended, no abdominal masses GU: No CVA tenderness Lymph: No cervical or inguinal lymphadenopathy. Skin: No rashes, bruises or suspicious lesions. Neurologic: Grossly intact, no focal deficits, moving all 4 extremities. Psychiatric: Normal mood and affect.  Laboratory Data: Lab Results  Component Value Date   WBC 9.2 03/07/2018   HGB 9.9 (L) 03/07/2018   HCT 28.1 (L) 03/07/2018   MCV 100.5 (H) 03/07/2018   PLT 172 03/07/2018    Lab Results  Component Value Date   CREATININE 0.67 03/07/2018     Urinalysis    Component Value Date/Time   COLORURINE YELLOW (A) 11/29/2017 0035   APPEARANCEUR Clear 03/09/2018 1610   LABSPEC 1.025 11/29/2017 0035   PHURINE 6.0 11/29/2017 0035   GLUCOSEU Negative 03/09/2018 1610   HGBUR NEGATIVE 11/29/2017 0035   BILIRUBINUR Negative 03/09/2018 1610   KETONESUR 5 (A) 11/29/2017 0035   PROTEINUR  3+ (A) 03/09/2018 1610   PROTEINUR NEGATIVE 11/29/2017 0035   NITRITE Positive (A) 03/09/2018 1610   NITRITE NEGATIVE 11/29/2017 0035   LEUKOCYTESUR 1+ (A) 03/09/2018 1610    Lab Results  Component Value Date   LABMICR See below: 03/09/2018   WBCUA 11-30 (A) 03/09/2018   RBCUA 3-10 (A) 03/09/2018   LABEPIT 0-10 03/09/2018   MUCUS Present (A) 03/09/2018   BACTERIA Many (A) 03/09/2018     Assessment & Plan:   PVR by bladder scan was 22 mL.  Urinalysis remarkable for pyuria and was nitrite positive.  A urine culture was ordered and Rx Septra DS was sent to his pharmacy.  He will be notified with the culture results.  He was instructed to call back as needed for any significant change.    Abbie Sons, [place] [address] [place],  [address] [phone]

## 2018-03-13 ENCOUNTER — Telehealth: Payer: Self-pay

## 2018-03-13 ENCOUNTER — Encounter: Payer: Self-pay | Admitting: Urology

## 2018-03-13 ENCOUNTER — Ambulatory Visit: Payer: Medicare Other

## 2018-03-13 NOTE — Telephone Encounter (Signed)
ERROR

## 2018-03-15 ENCOUNTER — Ambulatory Visit: Payer: Medicare Other | Admitting: Family Medicine

## 2018-03-15 DIAGNOSIS — R39198 Other difficulties with micturition: Secondary | ICD-10-CM

## 2018-03-15 NOTE — Progress Notes (Signed)
Catheter Removal  Patient is present today for a catheter removal.  49ml of water was drained from the balloon. A 16FR foley cath was removed from the bladder no complications were noted . Patient tolerated well.  Preformed by: Elberta Leatherwood, CMA

## 2018-03-16 ENCOUNTER — Telehealth: Payer: Self-pay | Admitting: Urgent Care

## 2018-03-16 ENCOUNTER — Other Ambulatory Visit: Payer: Self-pay

## 2018-03-16 ENCOUNTER — Encounter: Payer: Self-pay | Admitting: Emergency Medicine

## 2018-03-16 ENCOUNTER — Emergency Department: Payer: Medicare Other

## 2018-03-16 ENCOUNTER — Inpatient Hospital Stay: Payer: Non-veteran care

## 2018-03-16 ENCOUNTER — Observation Stay
Admission: EM | Admit: 2018-03-16 | Discharge: 2018-03-17 | Disposition: A | Discharge status: Home Health | Payer: Medicare Other | Attending: Internal Medicine | Admitting: Internal Medicine

## 2018-03-16 DIAGNOSIS — F419 Anxiety disorder, unspecified: Secondary | ICD-10-CM | POA: Diagnosis not present

## 2018-03-16 DIAGNOSIS — Z950 Presence of cardiac pacemaker: Secondary | ICD-10-CM | POA: Diagnosis not present

## 2018-03-16 DIAGNOSIS — Z881 Allergy status to other antibiotic agents status: Secondary | ICD-10-CM | POA: Insufficient documentation

## 2018-03-16 DIAGNOSIS — C7951 Secondary malignant neoplasm of bone: Secondary | ICD-10-CM | POA: Diagnosis not present

## 2018-03-16 DIAGNOSIS — F17210 Nicotine dependence, cigarettes, uncomplicated: Secondary | ICD-10-CM | POA: Insufficient documentation

## 2018-03-16 DIAGNOSIS — E876 Hypokalemia: Secondary | ICD-10-CM | POA: Diagnosis not present

## 2018-03-16 DIAGNOSIS — Z681 Body mass index (BMI) 19 or less, adult: Secondary | ICD-10-CM | POA: Insufficient documentation

## 2018-03-16 DIAGNOSIS — Z85118 Personal history of other malignant neoplasm of bronchus and lung: Secondary | ICD-10-CM | POA: Diagnosis not present

## 2018-03-16 DIAGNOSIS — I10 Essential (primary) hypertension: Secondary | ICD-10-CM | POA: Insufficient documentation

## 2018-03-16 DIAGNOSIS — E46 Unspecified protein-calorie malnutrition: Secondary | ICD-10-CM | POA: Insufficient documentation

## 2018-03-16 DIAGNOSIS — Z885 Allergy status to narcotic agent status: Secondary | ICD-10-CM | POA: Insufficient documentation

## 2018-03-16 DIAGNOSIS — Z7902 Long term (current) use of antithrombotics/antiplatelets: Secondary | ICD-10-CM | POA: Insufficient documentation

## 2018-03-16 DIAGNOSIS — N3001 Acute cystitis with hematuria: Secondary | ICD-10-CM | POA: Diagnosis present

## 2018-03-16 DIAGNOSIS — Z9221 Personal history of antineoplastic chemotherapy: Secondary | ICD-10-CM | POA: Diagnosis not present

## 2018-03-16 DIAGNOSIS — C679 Malignant neoplasm of bladder, unspecified: Secondary | ICD-10-CM | POA: Diagnosis not present

## 2018-03-16 DIAGNOSIS — I739 Peripheral vascular disease, unspecified: Secondary | ICD-10-CM | POA: Diagnosis not present

## 2018-03-16 DIAGNOSIS — Z8551 Personal history of malignant neoplasm of bladder: Secondary | ICD-10-CM | POA: Insufficient documentation

## 2018-03-16 DIAGNOSIS — Z923 Personal history of irradiation: Secondary | ICD-10-CM | POA: Insufficient documentation

## 2018-03-16 DIAGNOSIS — Z902 Acquired absence of lung [part of]: Secondary | ICD-10-CM | POA: Diagnosis not present

## 2018-03-16 DIAGNOSIS — E871 Hypo-osmolality and hyponatremia: Secondary | ICD-10-CM | POA: Diagnosis present

## 2018-03-16 DIAGNOSIS — R531 Weakness: Secondary | ICD-10-CM

## 2018-03-16 DIAGNOSIS — Z79899 Other long term (current) drug therapy: Secondary | ICD-10-CM | POA: Insufficient documentation

## 2018-03-16 DIAGNOSIS — G8929 Other chronic pain: Secondary | ICD-10-CM | POA: Insufficient documentation

## 2018-03-16 DIAGNOSIS — C672 Malignant neoplasm of lateral wall of bladder: Secondary | ICD-10-CM

## 2018-03-16 LAB — CBC WITH DIFFERENTIAL/PLATELET
BAND NEUTROPHILS: 11 %
BLASTS: 0 %
Band Neutrophils: 3 %
Basophils Absolute: 0.4 10*3/uL — ABNORMAL HIGH (ref 0–0.1)
Basophils Absolute: 0 10*3/uL (ref 0–0.1)
Basophils Relative: 2 %
Basophils Relative: 0 %
Blasts: 0 %
EOS ABS: 0 10*3/uL (ref 0–0.7)
Eosinophils Absolute: 0.2 10*3/uL (ref 0–0.7)
Eosinophils Relative: 1 %
Eosinophils Relative: 0 %
HCT: 30.8 % — ABNORMAL LOW (ref 40.0–52.0)
HEMATOCRIT: 32.7 % — AB (ref 40.0–52.0)
Hemoglobin: 10.5 g/dL — ABNORMAL LOW (ref 13.0–18.0)
Hemoglobin: 11.3 g/dL — ABNORMAL LOW (ref 13.0–18.0)
Lymphocytes Relative: 9 %
Lymphocytes Relative: 5 %
Lymphs Abs: 1.6 10*3/uL (ref 1.0–3.6)
Lymphs Abs: 0.8 10*3/uL — ABNORMAL LOW (ref 1.0–3.6)
MCH: 33 pg (ref 26.0–34.0)
MCH: 33.3 pg (ref 26.0–34.0)
MCHC: 34.3 g/dL (ref 32.0–36.0)
MCHC: 34.4 g/dL (ref 32.0–36.0)
MCV: 96.2 fL (ref 80.0–100.0)
MCV: 96.7 fL (ref 80.0–100.0)
MONOS PCT: 17 %
Metamyelocytes Relative: 3 %
Metamyelocytes Relative: 1 %
Monocytes Absolute: 2.1 10*3/uL — ABNORMAL HIGH (ref 0.2–1.0)
Monocytes Absolute: 2.6 10*3/uL — ABNORMAL HIGH (ref 0.2–1.0)
Monocytes Relative: 12 %
Myelocytes: 3 %
Myelocytes: 1 %
NEUTROS ABS: 11.7 10*3/uL — AB (ref 1.4–6.5)
NRBC: 0 /100{WBCs}
Neutro Abs: 13.6 10*3/uL — ABNORMAL HIGH (ref 1.4–6.5)
Neutrophils Relative %: 67 %
Neutrophils Relative %: 65 %
Other: 0 %
Other: 0 %
Platelets: 293 10*3/uL (ref 150–440)
Platelets: 244 10*3/uL (ref 150–440)
Promyelocytes Relative: 0 %
Promyelocytes Relative: 0 %
RBC: 3.2 MIL/uL — ABNORMAL LOW (ref 4.40–5.90)
RBC: 3.39 MIL/uL — ABNORMAL LOW (ref 4.40–5.90)
RDW: 19.7 % — ABNORMAL HIGH (ref 11.5–14.5)
RDW: 19.2 % — AB (ref 11.5–14.5)
Smear Review: ADEQUATE
WBC: 17.9 10*3/uL — ABNORMAL HIGH (ref 3.8–10.6)
WBC: 15.1 10*3/uL — ABNORMAL HIGH (ref 3.8–10.6)
nRBC: 0 /100 WBC

## 2018-03-16 LAB — URINALYSIS, COMPLETE (UACMP) WITH MICROSCOPIC
BILIRUBIN URINE: NEGATIVE
Glucose, UA: NEGATIVE mg/dL
KETONES UR: 20 mg/dL — AB
NITRITE: NEGATIVE
PH: 5 (ref 5.0–8.0)
Protein, ur: 30 mg/dL — AB
Specific Gravity, Urine: 1.021 (ref 1.005–1.030)

## 2018-03-16 LAB — COMPREHENSIVE METABOLIC PANEL
ALBUMIN: 3.1 g/dL — AB (ref 3.5–5.0)
ALT: 14 U/L — ABNORMAL LOW (ref 17–63)
ANION GAP: 11 (ref 5–15)
AST: 26 U/L (ref 15–41)
Alkaline Phosphatase: 123 U/L (ref 38–126)
BILIRUBIN TOTAL: 0.3 mg/dL (ref 0.3–1.2)
BUN: 11 mg/dL (ref 6–20)
CALCIUM: 8.3 mg/dL — AB (ref 8.9–10.3)
CO2: 20 mmol/L — ABNORMAL LOW (ref 22–32)
Chloride: 93 mmol/L — ABNORMAL LOW (ref 101–111)
Creatinine, Ser: 0.83 mg/dL (ref 0.61–1.24)
GFR calc Af Amer: >60 mL/min (ref >60)
Glucose, Bld: 129 mg/dL — ABNORMAL HIGH (ref 65–99)
POTASSIUM: 3.8 mmol/L (ref 3.5–5.1)
Sodium: 124 mmol/L — ABNORMAL LOW (ref 135–145)
TOTAL PROTEIN: 7.1 g/dL (ref 6.5–8.1)

## 2018-03-16 LAB — BASIC METABOLIC PANEL
Anion gap: 10 (ref 5–15)
BUN: 10 mg/dL (ref 6–20)
CO2: 18 mmol/L — ABNORMAL LOW (ref 22–32)
Calcium: 8.4 mg/dL — ABNORMAL LOW (ref 8.9–10.3)
Chloride: 95 mmol/L — ABNORMAL LOW (ref 101–111)
Creatinine, Ser: 0.75 mg/dL (ref 0.61–1.24)
GFR calc Af Amer: >60 mL/min (ref >60)
GFR calc non Af Amer: >60 mL/min (ref >60)
Glucose, Bld: 92 mg/dL (ref 65–99)
Potassium: 4 mmol/L (ref 3.5–5.1)
Sodium: 123 mmol/L — ABNORMAL LOW (ref 135–145)

## 2018-03-16 MED ORDER — AZTREONAM 2 G IJ SOLR
2.0000 g | Freq: Once | INTRAMUSCULAR | Status: AC
  Administered 2018-03-17: 2 g via INTRAVENOUS
  Filled 2018-03-16: qty 2

## 2018-03-16 MED ORDER — SODIUM CHLORIDE 0.9 % IV BOLUS
1000.0000 mL | Freq: Once | INTRAVENOUS | Status: AC
  Administered 2018-03-16: 1000 mL via INTRAVENOUS

## 2018-03-16 MED ORDER — LEVOFLOXACIN IN D5W 750 MG/150ML IV SOLN
750.0000 mg | Freq: Once | INTRAVENOUS | Status: AC
  Administered 2018-03-16: 750 mg via INTRAVENOUS
  Filled 2018-03-16: qty 150

## 2018-03-16 MED ORDER — DENOSUMAB 120 MG/1.7ML ~~LOC~~ SOLN
120.0000 mg | Freq: Once | SUBCUTANEOUS | Status: AC
  Administered 2018-03-16: 120 mg via SUBCUTANEOUS

## 2018-03-16 MED ORDER — HYDROCODONE-ACETAMINOPHEN 5-325 MG PO TABS
1.0000 | ORAL_TABLET | Freq: Once | ORAL | Status: AC
Start: 2018-03-16 — End: 2018-03-16
  Administered 2018-03-16: 1 via ORAL
  Filled 2018-03-16: qty 1

## 2018-03-16 NOTE — Telephone Encounter (Signed)
Call placed to patient by Dr. Mike Gip this afternoon (around 1515) to discuss lab results. Patient had labs done earlier today that give rise to concern for acute infection. MD spoke with [name]. Patient has a WBC of 17,900 (ANC 13,600). We learned that patient was seen by urology Bernardo Heater) on 03/09/18 for 2 days of worsening LUTS. Of note, he was seen in the medical oncology clinic on 03/07/18 and did not complain of any urinary symptoms.   UA done by urology and patient found to have a UTI. C&S (+) for > 100,000 CFU/mL E.coli. Patient was put on a 7 day course of Bactrim DS and discharged home. He returned to the urology clinic on 03/10/18 with complaints of acute urinary retention. Foley catheter was placed, draining 200 cc of dark yellow urine from the bladder.   Patient returned to the urology clinic on 03/15/18 and the foley was removed. Course of antibiotics completed.   Patient notes today that he "feels like hell". [name] denies N/V/D/F, however he is experiencing rigors. Patient chronically HYPOnatremic. Sodium lower than normal at 123.  Potassium normal at 4. Patient is on active chemotherapy (carboplatin + gemcitabine) for treatment of his metastatic bladder cancer. Last treatment was on 02/23/2018. D1C7 chemotherapy was postponed on 03/07/2018 due to patient requesting treatment holiday in order to have a short vacation.   In light of his current treatment status, concern for systemic infection (WBC 17.9) despite ABX, and his progressive HYPOnatremia the decision was made to send the patient to the ED for further evaluation of possible sepsis. Due to his immunocompromised state, we discussed the likelihood of admission for intravenous ABX and electrolyte replacement. Patient verbalized understanding. He is to prevent POV to the [facility]. Called reported to ED charge nurse to make them aware that [name] would be coming in.    Honor Loh, MSN, APRN, FNP-C, CEN Oncology/Hematology Nurse  Practitioner  [facility] 03/16/18, 9:58 PM

## 2018-03-16 NOTE — ED Notes (Signed)
Patient moved to hospital bed for increased comfort. MD aware of patient's pain. See orders

## 2018-03-16 NOTE — ED Notes (Signed)
Pts o2 sats low due to pt having part of the right lung removed.

## 2018-03-16 NOTE — ED Provider Notes (Signed)
[facility] Emergency Department Provider Note    First MD Initiated Contact with Patient 03/16/18 2003     (approximate)  I have reviewed the triage vital signs and the nursing notes.   HISTORY  Chief Complaint abnormal labs    HPI Patrick Hooper is a 72 y.o. male a history of stage IV bladder cancer currently undergoing therapy with hematology oncology with recent treatment for acute cystitis of Bactrim presents for worsening generalized malaise fatigue as well as some persistent dysuria and had routine blood work today showing that he was acutely hyponatremic so was sent to the ER.  Denies any vomiting.  Has had some nausea.  Has had decreased appetite.  No measured fevers at home.  No shortness of breath.  He is status post partial right lobectomy.  Past Medical History:  Diagnosis Date  . Bladder cancer ([employer])   . Hypertension   . Lung cancer ([place]) 2016  . PVD (peripheral vascular disease) ([facility])    Family History  Problem Relation Age of Onset  . Cancer Mother   . Cancer Sister   . Cancer Maternal Grandmother    Past Surgical History:  Procedure Laterality Date  . BLADDER SURGERY  08/12/2017  . BYPASS GRAFT FEMORAL/POPLITEAL W/VEIN  2009  . COLONOSCOPY  2013  . CYSTOSCOPY  08/12/2017  . IR ABDOMINAL AORTOBIFEMORAL CATHETER SERIALOGRAM  2009  . LOBECTOMY Right 09/2015  . PACEMAKER INSERTION  2003  . PORTA CATH INSERTION N/A 11/21/2017   Procedure: PORTA CATH INSERTION;  Surgeon: Algernon Huxley, MD;  Location: [facility] [facility];  Service: Cardiovascular;  Laterality: N/A;   Patient Active Problem List   Diagnosis Date Noted  . Hyponatremia 03/16/2018  . Anxiety 03/05/2018  . Hypomagnesemia 03/05/2018  . Hypokalemia 01/28/2018  . Insomnia 01/28/2018  . Low vitamin B12 level 12/15/2017  . Leukopenia 12/01/2017  . Encounter for antineoplastic chemotherapy 11/03/2017  . Pressure injury of skin 10/13/2017  . Hypotension 10/13/2017  .  Weakness 10/12/2017  . Cancer-related pain 09/27/2017  . Urothelial carcinoma of bladder ([place]) 09/14/2017  . Goals of care, counseling/discussion 09/12/2017  . Malignant neoplasm of lateral wall of urinary bladder ([place]) 09/07/2017  . 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 Authorizing Provider  acetaminophen (TYLENOL) 500 MG tablet Take 500 mg by mouth every 6 (six) hours as needed for mild pain.     [provider]  albuterol (PROVENTIL HFA) 108 (90 Base) MCG/ACT inhaler Inhale 2 puffs into the lungs every 6 (six) hours as needed. 01/20/18 01/20/19  [provider]  ALPRAZolam Duanne Moron) 0.25 MG tablet Take 1 tablet (0.25 mg total) by mouth 2 (two) times daily as needed for anxiety. 02/16/18   Lequita Asal, MD  aspirin EC 81 MG tablet Take 81 mg by mouth at bedtime.    [provider]  calcium carbonate 1250 MG capsule Take 1,250 mg by mouth daily.    [provider]  cholecalciferol (VITAMIN D) 400 units TABS tablet Take 800 Units by mouth daily.    [provider]  clopidogrel (PLAVIX) 75 MG tablet Take 1 tablet (75 mg total) by mouth at bedtime. 12/07/17   Elby Beck, FNP  dexamethasone (DECADRON) 4 MG tablet Take 2 tablets (8 mg total) by mouth daily. Start the day after carboplatin  chemotherapy for 2 days. 11/03/17   Lequita Asal, MD  docusate sodium (COLACE) 100 MG capsule Take 200 mg by mouth daily.     [provider]  gabapentin (NEURONTIN) 100 MG capsule TAKE 1 CAPSULE (100 MG TOTAL) BY MOUTH AT BEDTIME. 03/10/18   Elby Beck, FNP  lactulose (CHRONULAC) 10 GM/15ML solution Take 45 mLs (30 g total) by mouth 2 (two) times daily as needed for mild constipation or severe constipation. 10/14/17   Loletha Grayer, MD  lidocaine-prilocaine (EMLA) cream Apply 1 application  topically PRN 1 hour prior to procedure. Cover with press and seal until treatment. 11/24/17   [name], NP  LORazepam (ATIVAN) 0.5 MG tablet Take 1 tablet (0.5 mg total) by mouth every 6 (six) hours as needed (Nausea or vomiting). 11/03/17   Lequita Asal, MD  magnesium oxide (MAG-OX) 400 (241.3 Mg) MG tablet Take 1 tablet (400 mg total) by mouth daily. 02/16/18   Lequita Asal, MD  megestrol (MEGACE ORAL) 40 MG/ML suspension Take 5 mLs (200 mg total) by mouth daily. 10/06/17   [name], NP  morphine (MS CONTIN) 15 MG 12 hr tablet Take 1 tablet (15 mg total) by mouth every 12 (twelve) hours. 03/07/18   [name], NP  ondansetron (ZOFRAN) 4 MG tablet Take 1 tablet (4 mg total) by mouth every 6 (six) hours as needed for nausea. 10/14/17   Loletha Grayer, MD  oxycodone (OXY-IR) 5 MG capsule Take 1 capsule (5 mg total) by mouth every 4 (four) hours as needed for pain. 10/24/17   [name], NP  polyethylene glycol (MIRALAX / GLYCOLAX) packet Take 17 g by mouth daily. 10/15/17   Loletha Grayer, MD  potassium chloride SA (K-DUR,KLOR-CON) 20 MEQ tablet Take 1 tablet (20 mEq total) by mouth daily. 01/26/18   [name], NP  protein supplement shake (PREMIER PROTEIN) LIQD Take 325 mLs (11 oz total) by mouth 2 (two) times daily between meals. 10/14/17   Loletha Grayer, MD  sulfamethoxazole-trimethoprim (BACTRIM DS,SEPTRA DS) 800-160 MG tablet Take 1 tablet by mouth every 12 (twelve) hours. 03/09/18   Stoioff, Ronda Fairly, MD    Allergies Cefazolin; Carvedilol; Chlorthalidone; Hydralazine; Hydrochlorothiazide; and Lortab [hydrocodone-acetaminophen]    Social History Social History   Tobacco Use  . Smoking status: Current Every Day Smoker    Packs/day: 1.00    Years: 55.00    Pack years: 55.00    Types: Cigarettes  . Smokeless tobacco: Never Used  Substance Use Topics  . Alcohol use: Yes    Comment: Occasional beer  . Drug use: No    Review of Systems Patient denies  headaches, rhinorrhea, blurry vision, numbness, shortness of breath, chest pain, edema, cough, abdominal pain, nausea, vomiting, diarrhea, dysuria, fevers, rashes or hallucinations unless otherwise stated above in HPI. ____________________________________________   PHYSICAL EXAM:  VITAL SIGNS: Vitals:   03/16/18 2101 03/16/18 2130  BP: 132/68 113/64  Pulse: 94   Resp: 20 (!) 21  Temp:    SpO2: 99%     Constitutional: Alert and oriented. frail appearing and in no acute distress. Eyes: Conjunctivae are normal.  Head: Atraumatic. Nose: No congestion/rhinnorhea. Mouth/Throat: Mucous membranes are moist.   Neck: No stridor. Painless ROM.  Cardiovascular: Normal rate, regular rhythm. Grossly normal heart sounds.  Good peripheral circulation. Respiratory: Normal respiratory effort.  No retractions. Lungs CTAB. Gastrointestinal: Soft and nontender. No distention. No abdominal bruits. No CVA tenderness. Genitourinary: deferred Musculoskeletal: No lower extremity  tenderness nor edema.  No joint effusions. Neurologic:  Normal speech and language. No gross focal neurologic deficits are appreciated. No facial droop Skin:  Skin is warm, dry and intact. No rash noted. Psychiatric: Mood and affect are normal. Speech and behavior are normal.  ____________________________________________   LABS (all labs ordered are listed, but only abnormal results are displayed)  Results for orders placed or performed during the hospital encounter of 03/16/18 (from the past 24 hour(s))  CBC with Differential     Status: Abnormal   Collection Time: 03/16/18  4:41 PM  Result Value Ref Range   WBC 15.1 (H) 3.8 - 10.6 K/uL   RBC 3.39 (L) 4.40 - 5.90 MIL/uL   Hemoglobin 11.3 (L) 13.0 - 18.0 g/dL   HCT 32.7 (L) 40.0 - 52.0 %   MCV 96.7 80.0 - 100.0 fL   MCH 33.3 26.0 - 34.0 pg   MCHC 34.4 32.0 - 36.0 g/dL   RDW 19.2 (H) 11.5 - 14.5 %   Platelets 244 150 - 440 K/uL   Neutrophils Relative % 65 %    Lymphocytes Relative 5 %   Monocytes Relative 17 %   Eosinophils Relative 0 %   Basophils Relative 0 %   Band Neutrophils 11 %   Metamyelocytes Relative 1 %   Myelocytes 1 %   Promyelocytes Relative 0 %   Blasts 0 %   nRBC 0 0 /100 WBC   Other 0 %   Neutro Abs 11.7 (H) 1.4 - 6.5 K/uL   Lymphs Abs 0.8 (L) 1.0 - 3.6 K/uL   Monocytes Absolute 2.6 (H) 0.2 - 1.0 K/uL   Eosinophils Absolute 0.0 0 - 0.7 K/uL   Basophils Absolute 0.0 0 - 0.1 K/uL   Smear Review MORPHOLOGY UNREMARKABLE   Comprehensive metabolic panel     Status: Abnormal   Collection Time: 03/16/18  4:41 PM  Result Value Ref Range   Sodium 124 (L) 135 - 145 mmol/L   Potassium 3.8 3.5 - 5.1 mmol/L   Chloride 93 (L) 101 - 111 mmol/L   CO2 20 (L) 22 - 32 mmol/L   Glucose, Bld 129 (H) 65 - 99 mg/dL   BUN 11 6 - 20 mg/dL   Creatinine, Ser 0.83 0.61 - 1.24 mg/dL   Calcium 8.3 (L) 8.9 - 10.3 mg/dL   Total Protein 7.1 6.5 - 8.1 g/dL   Albumin 3.1 (L) 3.5 - 5.0 g/dL   AST 26 15 - 41 U/L   ALT 14 (L) 17 - 63 U/L   Alkaline Phosphatase 123 38 - 126 U/L   Total Bilirubin 0.3 0.3 - 1.2 mg/dL   GFR calc non Af Amer >60 >60 mL/min   GFR calc Af Amer >60 >60 mL/min   Anion gap 11 5 - 15  Urinalysis, Complete w Microscopic     Status: Abnormal   Collection Time: 03/16/18  9:26 PM  Result Value Ref Range   Color, Urine YELLOW (A) YELLOW   APPearance HAZY (A) CLEAR   Specific Gravity, Urine 1.021 1.005 - 1.030   pH 5.0 5.0 - 8.0   Glucose, UA NEGATIVE NEGATIVE mg/dL   Hgb urine dipstick SMALL (A) NEGATIVE   Bilirubin Urine NEGATIVE NEGATIVE   Ketones, ur 20 (A) NEGATIVE mg/dL   Protein, ur 30 (A) NEGATIVE mg/dL   Nitrite NEGATIVE NEGATIVE   Leukocytes, UA SMALL (A) NEGATIVE   RBC / HPF 21-50 0 - 5 RBC/hpf   WBC, UA 21-50 0 - 5  WBC/hpf   Bacteria, UA RARE (A) NONE SEEN   Squamous Epithelial / LPF 0-5 0 - 5   Mucus PRESENT    Hyaline Casts, UA PRESENT    Crystals PRESENT (A) NEGATIVE    ____________________________________________ ____________________________________________  RADIOLOGY  I personally reviewed all radiographic images ordered to evaluate for the above acute complaints and reviewed radiology reports and findings.  These findings were personally discussed with the patient.  Please see medical record for radiology report.  ____________________________________________   PROCEDURES  Procedure(s) performed:  .Critical Care Performed by: Merlyn Lot, MD Authorized by: Merlyn Lot, MD   Critical care provider statement:    Critical care time (minutes):  20   Critical care time was exclusive of:  Separately billable procedures and treating other patients   Critical care was necessary to treat or prevent imminent or life-threatening deterioration of the following conditions:  Metabolic crisis   Critical care was time spent personally by me on the following activities:  Development of treatment plan with patient or surrogate, discussions with consultants, evaluation of patient's response to treatment, examination of patient, obtaining history from patient or surrogate, ordering and performing treatments and interventions, ordering and review of laboratory studies, ordering and review of radiographic studies, pulse oximetry, re-evaluation of patient's condition and review of old charts      Critical Care performed: yes  ____________________________________________   INITIAL IMPRESSION / ASSESSMENT AND PLAN / ED COURSE  Pertinent labs & imaging results that were available during my care of the patient were reviewed by me and considered in my medical decision making (see chart for details).  DDX: Sepsis, electrolyte abnormality, dehydration, UTI, pyelonephritis  Kalab Camps is a 73 y.o. who presents to the ED with symptoms as described above.  Patient with evidence of acute hyponatremia likely secondary to dehydration poor oral intake.   Patient given IV fluids for resuscitation.  Blood work does show evidence of leukocytosis and persistent cystitis therefore will start on broad-spectrum IV antibiotics with aztreonam as well as Levaquin.  Patient will require hospitalization given his frailty complex past medical history.      As part of my medical decision making, I reviewed the following data within the [place] notes reviewed and incorporated, Labs reviewed, notes from prior ED visits.   ____________________________________________   FINAL CLINICAL IMPRESSION(S) / ED DIAGNOSES  Final diagnoses:  Hyponatremia  Acute cystitis with hematuria      NEW MEDICATIONS STARTED DURING THIS VISIT:  New Prescriptions   No medications on file     Note:  This document was prepared using Dragon voice recognition software and may include unintentional dictation errors.    Merlyn Lot, MD 03/16/18 2230

## 2018-03-16 NOTE — ED Triage Notes (Signed)
Pt to ED via POV c/o abnormal lab work. Pt states that he had labs done at his PCPs office today and they called him and told him that his WBC was 17.9 and that his sodium was 123. Pt states that he recently was treated for UTI.

## 2018-03-17 ENCOUNTER — Other Ambulatory Visit: Payer: Self-pay

## 2018-03-17 DIAGNOSIS — R531 Weakness: Secondary | ICD-10-CM

## 2018-03-17 LAB — CULTURE, URINE COMPREHENSIVE

## 2018-03-17 LAB — BASIC METABOLIC PANEL
Anion gap: 6 (ref 5–15)
BUN: 7 mg/dL (ref 6–20)
CALCIUM: 7.4 mg/dL — AB (ref 8.9–10.3)
CO2: 19 mmol/L — ABNORMAL LOW (ref 22–32)
CREATININE: 0.54 mg/dL — AB (ref 0.61–1.24)
Chloride: 101 mmol/L (ref 101–111)
GFR calc Af Amer: >60 mL/min (ref >60)
GLUCOSE: 97 mg/dL (ref 65–99)
Potassium: 3.8 mmol/L (ref 3.5–5.1)
SODIUM: 126 mmol/L — AB (ref 135–145)

## 2018-03-17 LAB — CBC
HCT: 26.5 % — ABNORMAL LOW (ref 40.0–52.0)
Hemoglobin: 8.9 g/dL — ABNORMAL LOW (ref 13.0–18.0)
MCH: 32.4 pg (ref 26.0–34.0)
MCHC: 33.4 g/dL (ref 32.0–36.0)
MCV: 96.9 fL (ref 80.0–100.0)
PLATELETS: 196 10*3/uL (ref 150–440)
RBC: 2.74 MIL/uL — ABNORMAL LOW (ref 4.40–5.90)
RDW: 19.6 % — AB (ref 11.5–14.5)
WBC: 14.7 10*3/uL — AB (ref 3.8–10.6)

## 2018-03-17 LAB — TSH: TSH: 1.624 u[IU]/mL (ref 0.350–4.500)

## 2018-03-17 MED ORDER — CIPROFLOXACIN HCL 500 MG PO TABS: 500.0000 mg | ORAL_TABLET | Freq: Two times a day (BID) | ORAL | 0 refills | Status: AC

## 2018-03-17 MED ORDER — ALPRAZOLAM 0.25 MG PO TABS
0.2500 mg | ORAL_TABLET | Freq: Two times a day (BID) | ORAL | Status: DC | PRN
Start: 2018-03-17 — End: 2018-03-17

## 2018-03-17 MED ORDER — ACETAMINOPHEN 325 MG PO TABS: 650.0000 mg | ORAL_TABLET | Freq: Four times a day (QID) | ORAL | Status: DC | PRN

## 2018-03-17 MED ORDER — ONDANSETRON HCL 4 MG PO TABS: 4.0000 mg | ORAL_TABLET | Freq: Four times a day (QID) | ORAL | Status: DC | PRN

## 2018-03-17 MED ORDER — MORPHINE SULFATE (PF) 2 MG/ML IV SOLN: 2.0000 mg | INTRAVENOUS | Status: DC | PRN

## 2018-03-17 MED ORDER — DOCUSATE SODIUM 100 MG PO CAPS: 100.0000 mg | ORAL_CAPSULE | Freq: Two times a day (BID) | ORAL | Status: DC | PRN

## 2018-03-17 MED ORDER — SODIUM CHLORIDE 0.9 % IV SOLN
1.0000 g | Freq: Three times a day (TID) | INTRAVENOUS | Status: DC
  Administered 2018-03-17: 07:00:00 1 g via INTRAVENOUS
  Filled 2018-03-17 (×3): qty 1

## 2018-03-17 MED ORDER — SODIUM CHLORIDE 0.9 % IV SOLN
INTRAVENOUS | Status: DC
  Administered 2018-03-17: 04:00:00 via INTRAVENOUS

## 2018-03-17 MED ORDER — MORPHINE SULFATE ER 15 MG PO TBCR: 15.0000 mg | EXTENDED_RELEASE_TABLET | Freq: Two times a day (BID) | ORAL | Status: DC | PRN

## 2018-03-17 MED ORDER — POTASSIUM CHLORIDE CRYS ER 20 MEQ PO TBCR
20.0000 meq | EXTENDED_RELEASE_TABLET | Freq: Every day | ORAL | Status: DC
Start: 2018-03-17 — End: 2018-03-17
  Administered 2018-03-17: 20 meq via ORAL
  Filled 2018-03-17: qty 1

## 2018-03-17 MED ORDER — CLOPIDOGREL BISULFATE 75 MG PO TABS: 75.0000 mg | ORAL_TABLET | Freq: Every day | ORAL | Status: DC

## 2018-03-17 MED ORDER — CHOLECALCIFEROL 10 MCG (400 UNIT) PO TABS
800.0000 [IU] | ORAL_TABLET | Freq: Every day | ORAL | Status: DC
  Administered 2018-03-17: 11:00:00 800 [IU] via ORAL
  Filled 2018-03-17: qty 2

## 2018-03-17 MED ORDER — ENOXAPARIN SODIUM 40 MG/0.4ML ~~LOC~~ SOLN: 40.0000 mg | SUBCUTANEOUS | Status: DC

## 2018-03-17 MED ORDER — HEPARIN SOD (PORK) LOCK FLUSH 100 UNIT/ML IV SOLN
500.0000 [IU] | Freq: Once | INTRAVENOUS | Status: AC
  Administered 2018-03-17: 500 [IU] via INTRAVENOUS
  Filled 2018-03-17: qty 5

## 2018-03-17 MED ORDER — POLYETHYLENE GLYCOL 3350 17 G PO PACK
17.0000 g | PACK | Freq: Every day | ORAL | Status: DC
  Filled 2018-03-17: qty 1

## 2018-03-17 MED ORDER — MEGESTROL ACETATE 400 MG/10ML PO SUSP
200.0000 mg | Freq: Every day | ORAL | Status: DC
  Filled 2018-03-17: qty 10

## 2018-03-17 MED ORDER — PREMIER PROTEIN SHAKE: 11.0000 [oz_av] | Freq: Two times a day (BID) | ORAL | Status: DC

## 2018-03-17 MED ORDER — ACETAMINOPHEN 650 MG RE SUPP: 650.0000 mg | Freq: Four times a day (QID) | RECTAL | Status: DC | PRN

## 2018-03-17 MED ORDER — ONDANSETRON HCL 4 MG/2ML IJ SOLN: 4.0000 mg | Freq: Four times a day (QID) | INTRAMUSCULAR | Status: DC | PRN

## 2018-03-17 MED ORDER — OXYCODONE HCL 5 MG PO TABS: 5.0000 mg | ORAL_TABLET | ORAL | Status: DC | PRN

## 2018-03-17 MED ORDER — SODIUM CHLORIDE 0.9 % IV BOLUS
1000.0000 mL | Freq: Once | INTRAVENOUS | Status: AC
  Administered 2018-03-17: 1000 mL via INTRAVENOUS

## 2018-03-17 MED ORDER — LACTULOSE 10 GM/15ML PO SOLN
30.0000 g | Freq: Two times a day (BID) | ORAL | Status: DC | PRN
Start: 2018-03-17 — End: 2018-03-17

## 2018-03-17 MED ORDER — MEGESTROL ACETATE 40 MG/ML PO SUSP
200.0000 mg | Freq: Every day | ORAL | Status: DC
  Filled 2018-03-17: qty 5

## 2018-03-17 NOTE — Progress Notes (Signed)
Pharmacy Antibiotic Note  Phu Record is a 72 y.o. male admitted on 03/16/2018 with UTI.  Pharmacy has been consulted for aztreonam dosing.  Plan: Aztreonam 1 gram q 8 hours ordered.  Height: 5\' 11"  (180.3 cm) Weight: 118 lb 14.4 oz (53.9 kg) IBW/kg (Calculated) : 75.3  Temp (24hrs), Avg:97.9 F (36.6 C), Min:97.8 F (36.6 C), Max:97.9 F (36.6 C)  Recent Labs  Lab 03/16/18 1242 03/16/18 1641  WBC 17.9* 15.1*  CREATININE 0.75 0.83    Estimated Creatinine Clearance: 63.1 mL/min (by C-G formula based on SCr of 0.83 mg/dL).    Allergies  Allergen Reactions  . Cefazolin Rash and Shortness Of Breath  . Carvedilol Other (See Comments)    fatigue  . Chlorthalidone Other (See Comments)    Other reaction(s): Unknown  . Hydralazine     Other reaction(s): Unknown  . Hydrochlorothiazide Other (See Comments)  . Lortab [Hydrocodone-Acetaminophen]     Vivid dreaming    Antimicrobials this admission: Levaquin x1; Aztreonam 5/17  >>    >>   Dose adjustments this admission:   Microbiology results: 5/16 UCx: pending       UA: LE(+) NO2(-)  WBC 21-50  Thank you for allowing pharmacy to be a part of this patient's care.  Rashaan Wyles S 03/17/2018 3:30 AM

## 2018-03-17 NOTE — Care Management Note (Signed)
Case Management Note  Patient Details  Name: Patrick Hooper MRN: [id number] Date of Birth: 31-Aug-1946  Subjective/Objective: Admitted to [facility] with the diagnosis of general weakness under observation status. Lives with wife, [name]. Sister is [name] [phone]). [facility]. Last seen Carlean Purl NP at [facility]. Also, goes to [facility] is currently in the home weekly. Possibly [facility]. No skilled nursing. No home oxygen. Rolling walker, and shower bench in the home.   Takes care of all basic activities of daily living himself, drives. No falls. Decreased appetite. Lost 3 pounds x 1 week. Wife will transport                 Action/Plan: Will continue to follow for discharge plans   Expected Discharge Date:                  Expected Discharge Plan:     In-House Referral:     Discharge planning Services     Post Acute Care Choice:    Choice offered to:     DME Arranged:    DME Agency:     HH Arranged:    [place] Agency:     Status of Service:     If discussed at [employer], dates discussed:    Additional Comments:  Shelbie Ammons, RN MSN CCM Care Management [phone] 03/17/2018, 9:07 AM

## 2018-03-17 NOTE — Progress Notes (Signed)
Patient is very anxious and wants to go out and smoke told that patient that he cant smoke while he is admitted to hospital.  He is also unhappy that he did not get directly admitted and had to come to emergency room for admission.  However his sodium is better than before from 1 24-1 26.  Told the nurse to give another liter of bolus fluid before he goes home.  Patient is really anxious to go home. Discharge instructions, antibiotics for UTI are in the computer.  Patient can follow-up at cancer center regarding his cancer treatments.  According to documentation patient had urine retention, Foley catheter placed on May 10 and removed on May 15 at urology clinic.

## 2018-03-17 NOTE — Progress Notes (Signed)
Patient discharged home per MD order. All discharge instructions given and all questions answered. 

## 2018-03-17 NOTE — H&P (Addendum)
Spiro Taher Vannote is an 72 y.o. male.   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  The patient was told that his white blood cell count was elevated and that his sodium was lower than normal.  Laboratory evaluation in the emergency department confirmed sodium level of 124 as well as white blood cell count of 15.1; source appears to be urine as the patient has chronic cystitis and worsening urinary tract infection.  The patient states that he is very weak and that he is unable to hold down much oral intake which prompted the emergency department staff to call the hospitalist service for admission.  Past Medical History:  Diagnosis Date  . Bladder cancer ([place])   . Hypertension   . Lung cancer ([place]) 2016  . PVD (peripheral vascular disease) ([place])     Past Surgical History:  Procedure Laterality Date  . BLADDER SURGERY  08/12/2017  . BYPASS GRAFT FEMORAL/POPLITEAL W/VEIN  2009  . COLONOSCOPY  2013  . CYSTOSCOPY  08/12/2017  . IR ABDOMINAL AORTOBIFEMORAL CATHETER SERIALOGRAM  2009  . LOBECTOMY Right 09/2015  . PACEMAKER INSERTION  2003  . PORTA CATH INSERTION N/A 11/21/2017   Procedure: PORTA CATH INSERTION;  Surgeon: Algernon Huxley, MD;  Location: [place] [facility];  Service: Cardiovascular;  Laterality: N/A;    Family History  Problem Relation Age of Onset  . Cancer Mother   . Cancer Sister   . Cancer Maternal Grandmother    Social History:  reports that he has been smoking cigarettes.  He has a 55.00 pack-year smoking history. He has never used smokeless tobacco. He reports that he drinks alcohol. He reports that he does not use drugs.  Allergies:  Allergies  Allergen Reactions  . Cefazolin Rash and Shortness Of Breath  . Carvedilol Other (See Comments)    fatigue  . Chlorthalidone Other (See Comments)    Other  reaction(s): Unknown  . Hydralazine     Other reaction(s): Unknown  . Hydrochlorothiazide Other (See Comments)  . Lortab [Hydrocodone-Acetaminophen]     Vivid dreaming    Medications Prior to Admission  Medication Sig Dispense Refill  . acetaminophen (TYLENOL) 500 MG tablet Take 500 mg by mouth every 6 (six) hours as needed for mild pain.     Marland Kitchen ALPRAZolam (XANAX) 0.25 MG tablet Take 1 tablet (0.25 mg total) by mouth 2 (two) times daily as needed for anxiety. 30 tablet 0  . cholecalciferol (VITAMIN D) 400 units TABS tablet Take 800 Units by mouth daily.    . clopidogrel (PLAVIX) 75 MG tablet Take 1 tablet (75 mg total) by mouth at bedtime. 90 tablet 1  . docusate sodium (COLACE) 100 MG capsule Take 100 mg by mouth 2 (two) times daily as needed for mild constipation or moderate constipation.     Marland Kitchen lactulose (CHRONULAC) 10 GM/15ML solution Take 45 mLs (30 g total) by mouth 2 (two) times daily as needed for mild constipation or severe constipation. 1892 mL 0  . megestrol (MEGACE ORAL) 40 MG/ML suspension Take 5 mLs (200 mg total) by mouth daily. (Patient taking differently: Take 200 mg by mouth daily as needed (for appetite). ) 240 mL 0  . ondansetron (ZOFRAN) 4 MG tablet Take 1 tablet (4 mg total) by mouth every 6 (six) hours as needed for nausea. 20 tablet 0  . oxycodone (OXY-IR)  5 MG capsule Take 1 capsule (5 mg total) by mouth every 4 (four) hours as needed for pain. 30 capsule 0  . polyethylene glycol (MIRALAX / GLYCOLAX) packet Take 17 g by mouth daily. 30 each 0  . potassium chloride SA (K-DUR,KLOR-CON) 20 MEQ tablet Take 1 tablet (20 mEq total) by mouth daily. 90 tablet 1  . lidocaine-prilocaine (EMLA) cream Apply 1 application topically PRN 1 hour prior to procedure. Cover with press and seal until treatment. 30 g 0  . morphine (MS CONTIN) 15 MG 12 hr tablet Take 1 tablet (15 mg total) by mouth every 12 (twelve) hours. (Patient taking differently: Take 15 mg by mouth every 12 (twelve)  hours as needed for pain. ) 30 tablet 0  . protein supplement shake (PREMIER PROTEIN) LIQD Take 325 mLs (11 oz total) by mouth 2 (two) times daily between meals. 60 Can 0    Results for orders placed or performed during the hospital encounter of 03/16/18 (from the past 48 hour(s))  CBC with Differential     Status: Abnormal   Collection Time: 03/16/18  4:41 PM  Result Value Ref Range   WBC 15.1 (H) 3.8 - 10.6 K/uL   RBC 3.39 (L) 4.40 - 5.90 MIL/uL   Hemoglobin 11.3 (L) 13.0 - 18.0 g/dL   HCT 32.7 (L) 40.0 - 52.0 %   MCV 96.7 80.0 - 100.0 fL   MCH 33.3 26.0 - 34.0 pg   MCHC 34.4 32.0 - 36.0 g/dL   RDW 19.2 (H) 11.5 - 14.5 %   Platelets 244 150 - 440 K/uL   Neutrophils Relative % 65 %   Lymphocytes Relative 5 %   Monocytes Relative 17 %   Eosinophils Relative 0 %   Basophils Relative 0 %   Band Neutrophils 11 %   Metamyelocytes Relative 1 %   Myelocytes 1 %   Promyelocytes Relative 0 %   Blasts 0 %   nRBC 0 0 /100 WBC   Other 0 %   Neutro Abs 11.7 (H) 1.4 - 6.5 K/uL   Lymphs Abs 0.8 (L) 1.0 - 3.6 K/uL   Monocytes Absolute 2.6 (H) 0.2 - 1.0 K/uL   Eosinophils Absolute 0.0 0 - 0.7 K/uL   Basophils Absolute 0.0 0 - 0.1 K/uL   Smear Review MORPHOLOGY UNREMARKABLE     Comment: Performed at [facility], [address] [place], [place] [address]  Comprehensive metabolic panel     Status: Abnormal   Collection Time: 03/16/18  4:41 PM  Result Value Ref Range   Sodium 124 (L) 135 - 145 mmol/L   Potassium 3.8 3.5 - 5.1 mmol/L   Chloride 93 (L) 101 - 111 mmol/L   CO2 20 (L) 22 - 32 mmol/L   Glucose, Bld 129 (H) 65 - 99 mg/dL   BUN 11 6 - 20 mg/dL   Creatinine, Ser 0.83 0.61 - 1.24 mg/dL   Calcium 8.3 (L) 8.9 - 10.3 mg/dL   Total Protein 7.1 6.5 - 8.1 g/dL   Albumin 3.1 (L) 3.5 - 5.0 g/dL   AST 26 15 - 41 U/L   ALT 14 (L) 17 - 63 U/L   Alkaline Phosphatase 123 38 - 126 U/L   Total Bilirubin 0.3 0.3 - 1.2 mg/dL   GFR calc non Af Amer >60 >60 mL/min   GFR calc Af Amer  >60 >60 mL/min    Comment: (NOTE) The eGFR has been calculated using the CKD EPI equation. This calculation has  not been validated in all clinical situations. eGFR's persistently <60 mL/min signify possible Chronic Kidney Disease.    Anion gap 11 5 - 15    Comment: Performed at [facility], [address] [place], [place] [address]  Urinalysis, Complete w Microscopic     Status: Abnormal   Collection Time: 03/16/18  9:26 PM  Result Value Ref Range   Color, Urine YELLOW (A) YELLOW   APPearance HAZY (A) CLEAR   Specific Gravity, Urine 1.021 1.005 - 1.030   pH 5.0 5.0 - 8.0   Glucose, UA NEGATIVE NEGATIVE mg/dL   Hgb urine dipstick SMALL (A) NEGATIVE   Bilirubin Urine NEGATIVE NEGATIVE   Ketones, ur 20 (A) NEGATIVE mg/dL   Protein, ur 30 (A) NEGATIVE mg/dL   Nitrite NEGATIVE NEGATIVE   Leukocytes, UA SMALL (A) NEGATIVE   RBC / HPF 21-50 0 - 5 RBC/hpf   WBC, UA 21-50 0 - 5 WBC/hpf   Bacteria, UA RARE (A) NONE SEEN   Squamous Epithelial / LPF 0-5 0 - 5   Mucus PRESENT    Hyaline Casts, UA PRESENT    Crystals PRESENT (A) NEGATIVE    Comment: Performed at [facility], [address], [place], [place] [address]   Dg Chest 2 View  Result Date: 03/16/2018 CLINICAL DATA:  Weakness. EXAM: CHEST - 2 VIEW COMPARISON:  October 12, 2017 FINDINGS: Stable pacemaker. No pneumothorax. A new right Port-A-Cath terminates in good position. Elevation of the right hemidiaphragm is again identified. There may be a tiny right effusion. Cardiomediastinal silhouette is stable. No focal infiltrate. Linear density in the lateral right lung is likely scar atelectasis. No other acute abnormalities. IMPRESSION: Possible tiny right pleural effusion.  No other acute abnormalities. Electronically Signed   By: Dorise Bullion III M.D   On: 03/16/2018 21:03    Review of Systems  Constitutional: Negative for chills and fever.  HENT: Negative for sore throat and tinnitus.   Eyes: Negative for  blurred vision and redness.  Respiratory: Negative for cough and shortness of breath.   Cardiovascular: Negative for chest pain, palpitations, orthopnea and PND.  Gastrointestinal: Negative for abdominal pain, diarrhea, nausea and vomiting.  Genitourinary: Negative for dysuria, frequency and urgency.  Musculoskeletal: Negative for joint pain and myalgias.  Skin: Negative for rash.       No lesions  Neurological: Positive for sensory change. Negative for speech change, focal weakness and weakness.  Endo/Heme/Allergies: Does not bruise/bleed easily.       No temperature intolerance  Psychiatric/Behavioral: Negative for depression and suicidal ideas.    Blood pressure (!) 115/50, pulse 94, temperature 97.9 F (36.6 C), temperature source Oral, resp. rate 17, height 5' 11"  (1.803 m), weight 56.2 kg (124 lb), SpO2 97 %. Physical Exam  Vitals reviewed. Constitutional: He is oriented to person, place, and time. He appears well-developed and well-nourished. No distress.  HENT:  Head: Normocephalic and atraumatic.  Mouth/Throat: Oropharynx is clear and moist.  Eyes: Pupils are equal, round, and reactive to light. Conjunctivae and EOM are normal. No scleral icterus.  Neck: Normal range of motion. Neck supple. No tracheal deviation present. No thyromegaly present.  Cardiovascular: Normal rate, regular rhythm and normal heart sounds. Exam reveals no gallop and no friction rub.  No murmur heard. Respiratory: Effort normal and breath sounds normal. No respiratory distress.  GI: Soft. Bowel sounds are normal. He exhibits no distension. There is no tenderness.  Genitourinary:  Genitourinary Comments: Deferred  Musculoskeletal: Normal range of  motion. He exhibits no edema.  Neurological: He is alert and oriented to person, place, and time. No cranial nerve deficit.  Skin: Skin is warm and dry. No rash noted.  Psychiatric: He has a normal mood and affect. His behavior is normal. Judgment and thought  content normal.     Assessment/Plan This is a 72 year old male admitted for generalized weakness. 1.  Weakness: Multifactorial; secondary to hyponatremia, poor p.o. intake, urinary tract infection and malignancy.  Treat with intravenous fluid.  Encourage p.o. intake.  PT/OT if needed. 2.  Urinary tract infection: Present on admission; nonresistant E. coli on previous infections.  Continue aztreonam for now. 3.  Peripheral vascular disease: Stable; continue Plavix 4.  DVT prophylaxis: Lovenox 5.  GI prophylaxis: None The patient is a full code.  Time spent on admission orders and patient care approximately 45 minutes  Harrie Foreman, MD 03/17/2018, 2:44 AM

## 2018-03-17 NOTE — Care Management Obs Status (Signed)
[place] NOTIFICATION   Patient Details  Name: Patrick Hooper MRN: [id number] Date of Birth: [birth year]   Medicare Observation Status Notification Given:  Yes. Permission to x     Shelbie Ammons, RN 03/17/2018, 9:15 AM

## 2018-03-18 LAB — URINE CULTURE

## 2018-03-20 ENCOUNTER — Encounter: Payer: Self-pay | Admitting: Family Medicine

## 2018-03-20 ENCOUNTER — Other Ambulatory Visit: Payer: Self-pay | Admitting: *Deleted

## 2018-03-20 ENCOUNTER — Ambulatory Visit (INDEPENDENT_AMBULATORY_CARE_PROVIDER_SITE_OTHER): Payer: Medicare Other | Admitting: Family Medicine

## 2018-03-20 VITALS — BP 96/66 | HR 123 | Temp 97.6°F | Ht 71.0 in | Wt 121.5 lb

## 2018-03-20 DIAGNOSIS — C7951 Secondary malignant neoplasm of bone: Secondary | ICD-10-CM | POA: Diagnosis not present

## 2018-03-20 DIAGNOSIS — R531 Weakness: Secondary | ICD-10-CM | POA: Diagnosis not present

## 2018-03-20 DIAGNOSIS — I951 Orthostatic hypotension: Secondary | ICD-10-CM | POA: Diagnosis not present

## 2018-03-20 DIAGNOSIS — F419 Anxiety disorder, unspecified: Secondary | ICD-10-CM | POA: Diagnosis not present

## 2018-03-20 DIAGNOSIS — M62838 Other muscle spasm: Secondary | ICD-10-CM

## 2018-03-20 DIAGNOSIS — R630 Anorexia: Secondary | ICD-10-CM | POA: Diagnosis not present

## 2018-03-20 MED ORDER — METHOCARBAMOL 500 MG PO TABS: 500.0000 mg | ORAL_TABLET | Freq: Three times a day (TID) | ORAL | 0 refills | Status: AC | PRN

## 2018-03-20 MED ORDER — OXYCODONE HCL 5 MG PO CAPS: 5.0000 mg | ORAL_CAPSULE | ORAL | 0 refills | Status: DC | PRN

## 2018-03-20 MED ORDER — MEGESTROL ACETATE 40 MG/ML PO SUSP: 200.0000 mg | Freq: Every day | ORAL | 1 refills | Status: AC | PRN

## 2018-03-20 MED ORDER — ALPRAZOLAM 0.25 MG PO TABS: 0.2500 mg | ORAL_TABLET | Freq: Two times a day (BID) | ORAL | 1 refills | Status: AC | PRN

## 2018-03-20 MED ORDER — MORPHINE SULFATE ER 15 MG PO TBCR: 15.0000 mg | EXTENDED_RELEASE_TABLET | Freq: Two times a day (BID) | ORAL | 0 refills | Status: AC

## 2018-03-20 NOTE — Discharge Summary (Signed)
Patrick Hooper, is a 72 y.o. male  DOB 1946/10/26  MRN [id number].  Admission date:  03/16/2018  Admitting Physician  Harrie Foreman, MD  Discharge Date:  03/17/2018   Primary MD  Elby Beck, FNP  Recommendations for primary care physician for things to follow:   Follow with PCP in 1 week Follow-up with cancer center as scheduled   Admission Diagnosis  Hyponatremia [E87.1] Acute cystitis with hematuria [N30.01]   Discharge Diagnosis  Hyponatremia [E87.1] Acute cystitis with hematuria [N30.01]    Active Problems:   Generalized weakness      Past Medical History:  Diagnosis Date  . Bladder cancer ([place])   . Hypertension   . Lung cancer ([place]) 2016  . PVD (peripheral vascular disease) ([place])     Past Surgical History:  Procedure Laterality Date  . BLADDER SURGERY  08/12/2017  . BYPASS GRAFT FEMORAL/POPLITEAL W/VEIN  2009  . COLONOSCOPY  2013  . CYSTOSCOPY  08/12/2017  . IR ABDOMINAL AORTOBIFEMORAL CATHETER SERIALOGRAM  2009  . LOBECTOMY Right 09/2015  . PACEMAKER INSERTION  2003  . PORTA CATH INSERTION N/A 11/21/2017   Procedure: PORTA CATH INSERTION;  Surgeon: Algernon Huxley, MD;  Location: [facility];  Service: Cardiovascular;  Laterality: N/A;       History of present illness and  Hospital Course:     Kindly see H&P for history of present illness and admission details, please review complete Labs, Consult reports and Test reports for all details in brief  HPI  from the history and physical done on the day of admission 72 year old male patient with history of metastatic bladder cancer admitted for generalized weakness, poor p.o. intake found to have acute cystitis, hyponatremia.   Hospital Course   Weakness: Multifactorial; secondary to hyponatremia, poor p.o. intake, urinary  tract infection and malignancy.    Sodium 124 on admission improved with IV fluids to 126.  Patient did not want to wait till the sodium comes up to 130 at least he wanted to go out and smoke, we offered nicotine patch but he refused to stay in the hospital and wanted to go home and smoke.    2.  Urinary tract infection: Present on admission; nonresistant E. coli on previous infections.  Patient E. coli was pansensitive to all antibiotics according to urine culture report from May 9, discharged home with Cipro 500 mg twice daily for 10 days.  Patient has history of previous urine retention, Foley catheter placed and removed as per urology note. 3.  Peripheral vascular disease: Stable; continue Plavix #4 tobacco abuse, offered nicotine patch, counseled to quit smoking, patient demanded to go out and smoke while he is in the hospital.   5.Anorexi;continue Megace 6.Malnutrition;continue premier protein 7.severe anxiety,chronic pain;continue xanax,MS continue. Discharge Condition: Stable   Follow UP  Follow-up Information    Elby Beck, FNP. Go on 03/20/2018.   Specialties:  Nurse Practitioner, Family Medicine Why:  at 11:30 a.m. Contact information: [place] [address] [phone]        Lequita Asal, MD. Schedule an appointment as soon as possible for a visit in 3 day(s).   Specialty:  Hematology and Oncology Contact information: [place] [address] [phone]             Discharge Instructions  and  Discharge Medications     Allergies as of 03/17/2018      Reactions   Cefazolin  Rash, Shortness Of Breath   Carvedilol Other (See Comments)   fatigue   Chlorthalidone Other (See Comments)   Other reaction(s): Unknown   Hydralazine    Other reaction(s): Unknown   Hydrochlorothiazide Other (See Comments)   Lortab [hydrocodone-acetaminophen]    Vivid dreaming      Medication List    TAKE these medications    acetaminophen 500 MG tablet Commonly known as:  TYLENOL Take 500 mg by mouth every 6 (six) hours as needed for mild pain.   ALPRAZolam 0.25 MG tablet Commonly known as:  XANAX Take 1 tablet (0.25 mg total) by mouth 2 (two) times daily as needed for anxiety.   cholecalciferol 400 units Tabs tablet Commonly known as:  VITAMIN D Take 800 Units by mouth daily.   ciprofloxacin 500 MG tablet Commonly known as:  CIPRO Take 1 tablet (500 mg total) by mouth 2 (two) times daily for 10 days.   clopidogrel 75 MG tablet Commonly known as:  PLAVIX Take 1 tablet (75 mg total) by mouth at bedtime.   docusate sodium 100 MG capsule Commonly known as:  COLACE Take 100 mg by mouth 2 (two) times daily as needed for mild constipation or moderate constipation.   lactulose 10 GM/15ML solution Commonly known as:  CHRONULAC Take 45 mLs (30 g total) by mouth 2 (two) times daily as needed for mild constipation or severe constipation.   lidocaine-prilocaine cream Commonly known as:  EMLA Apply 1 application topically PRN 1 hour prior to procedure. Cover with press and seal until treatment.   megestrol 40 MG/ML suspension Commonly known as:  MEGACE ORAL Take 5 mLs (200 mg total) by mouth daily. What changed:    when to take this  reasons to take this   morphine 15 MG 12 hr tablet Commonly known as:  MS CONTIN Take 1 tablet (15 mg total) by mouth every 12 (twelve) hours. What changed:    when to take this  reasons to take this   ondansetron 4 MG tablet Commonly known as:  ZOFRAN Take 1 tablet (4 mg total) by mouth every 6 (six) hours as needed for nausea.   oxycodone 5 MG capsule Commonly known as:  OXY-IR Take 1 capsule (5 mg total) by mouth every 4 (four) hours as needed for pain.   polyethylene glycol packet Commonly known as:  MIRALAX / GLYCOLAX Take 17 g by mouth daily.   potassium chloride SA 20 MEQ tablet Commonly known as:  K-DUR,KLOR-CON Take 1 tablet (20 mEq total) by  mouth daily.   protein supplement shake Liqd Commonly known as:  PREMIER PROTEIN Take 325 mLs (11 oz total) by mouth 2 (two) times daily between meals.         Diet and Activity recommendation: See Discharge Instructions above   Consults obtained -none   Major procedures and Radiology Reports - PLEASE review detailed and final reports for all details, in brief -     Dg Chest 2 View  Result Date: 03/16/2018 CLINICAL DATA:  Weakness. EXAM: CHEST - 2 VIEW COMPARISON:  October 12, 2017 FINDINGS: Stable pacemaker. No pneumothorax. A new right Port-A-Cath terminates in good position. Elevation of the right hemidiaphragm is again identified. There may be a tiny right effusion. Cardiomediastinal silhouette is stable. No focal infiltrate. Linear density in the lateral right lung is likely scar atelectasis. No other acute abnormalities. IMPRESSION: Possible tiny right pleural effusion.  No other acute abnormalities. Electronically Signed   By: Dorise Bullion III M.D  On: 03/16/2018 21:03   Nm Bone Scan Whole Body  Result Date: 03/01/2018 CLINICAL DATA:  Bladder cancer with bone metastasis. EXAM: NUCLEAR MEDICINE WHOLE BODY BONE SCAN TECHNIQUE: Whole body anterior and posterior images were obtained approximately 3 hours after intravenous injection of radiopharmaceutical. RADIOPHARMACEUTICALS:  21.98 mCi Technetium-27m MDP IV COMPARISON:  None FINDINGS: Multifocal abnormal areas of increased radiotracer uptake are identified throughout the thoracic and lumbar spine compatible with bone metastasis. Areas of uptake generally correspond to the FDG avid lesions noted on PET-CT from 01/23/2018. Additionally, also similar to recent PET-CT are radiotracer avid lesions within both sides of the pelvis as well as the right posterior ribs and right scapula. No abnormal uptake identified within the skull or appendicular skeleton. Normal physiologic tracer activity identified within the kidneys an urinary  bladder. IMPRESSION: 1. Examination is positive for multifocal areas of increased uptake involving the thoracic and lumbar spine, bony pelvis, right ribs and right scapula. Electronically Signed   By: Kerby Moors M.D.   On: 03/01/2018 09:12    Micro Results    Recent Results (from the past 240 hour(s))  Urine culture     Status: Abnormal   Collection Time: 03/16/18  9:26 PM  Result Value Ref Range Status   Specimen Description   Final    URINE, RANDOM Performed at [facility], [address], [place], [place] [address]    Special Requests   Final    NONE Performed at [facility], [address] [place], [place] [address]    Culture (A)  Final    <10,000 COLONIES/mL INSIGNIFICANT GROWTH Performed at [facility], [place] [address], [place], [place] [address]    Report Status 03/18/2018 FINAL  Final       Today   Subjective:   Patrick Hooper today eager to go home, did not want to wait for urine cultures and also IV fluids.  Objective:   Blood pressure 111/77, pulse (!) 103, temperature 97.8 F (36.6 C), temperature source Oral, resp. rate 16, height 5\' 11"  (1.803 m), weight 53.9 kg (118 lb 14.4 oz), SpO2 98 %.  No intake or output data in the 24 hours ending 03/20/18 1057  Exam Awake Alert, Oriented x 3, No new F.N deficits, Normal affect [place].AT,PERRAL Supple Neck,No JVD, No cervical lymphadenopathy appriciated.  Symmetrical Chest wall movement, Good air movement bilaterally, CTAB RRR,No Gallops,Rubs or new Murmurs, No Parasternal Heave +ve B.Sounds, Abd Soft, Non tender, No organomegaly appriciated, No rebound -guarding or rigidity. No Cyanosis, Clubbing or edema, No new Rash or bruise  Data Review   CBC w Diff:  Lab Results  Component Value Date   WBC 14.7 (H) 03/17/2018   HGB 8.9 (L) 03/17/2018   HCT 26.5 (L) 03/17/2018   PLT 196 03/17/2018   LYMPHOPCT 5 03/16/2018   BANDSPCT 11 03/16/2018   MONOPCT 17 03/16/2018   EOSPCT 0  03/16/2018   BASOPCT 0 03/16/2018    CMP:  Lab Results  Component Value Date   NA 126 (L) 03/17/2018   K 3.8 03/17/2018   CL 101 03/17/2018   CO2 19 (L) 03/17/2018   BUN 7 03/17/2018   CREATININE 0.54 (L) 03/17/2018   PROT 7.1 03/16/2018   ALBUMIN 3.1 (L) 03/16/2018   BILITOT 0.3 03/16/2018   ALKPHOS 123 03/16/2018   AST 26 03/16/2018   ALT 14 (L) 03/16/2018  .   Total Time in preparing paper work, data evaluation and todays exam - 35 minutes  Epifanio Lesches M.D on 03/17/2018 at 10:57 AM    Note: This dictation was prepared with Dragon dictation along with smaller phrase technology. Any transcriptional errors that result from this process are unintentional.

## 2018-03-20 NOTE — Progress Notes (Signed)
Subjective:    Patient ID: Patrick Hooper, male    DOB: 08-06-1946, 72 y.o.   MRN: [id number]  HPI This is a 72 yo male who presents today for follow up of recent hospitalization. Accompanied by his SO, Patrick Hooper.  He is currently on chemo holiday. Feels mostly housebound, difficulty walking. Feels like he has difficulty taking deep breaths. No improvement with albuterol. Bones hurt. Neck spasms. Tylenol not working, is currently taking MS contin 12 hour and and Oxy IR for break through pain. Home health nurse is coming out once a week.   Has tightness in neck muscles, requests muscle relaxer.   He is flying to [place] next week to for a week to visit with family.   He is considering not having any more treatment. Doesn't feel like recent chemo has helped any of his symptoms and he knows he is dying from his cancer.     Past Medical History:  Diagnosis Date  . Bladder cancer ([place])   . Hypertension   . Lung cancer ([place]) 2016  . PVD (peripheral vascular disease) ([place])    Past Surgical History:  Procedure Laterality Date  . BLADDER SURGERY  08/12/2017  . BYPASS GRAFT FEMORAL/POPLITEAL W/VEIN  2009  . COLONOSCOPY  2013  . CYSTOSCOPY  08/12/2017  . IR ABDOMINAL AORTOBIFEMORAL CATHETER SERIALOGRAM  2009  . LOBECTOMY Right 09/2015  . PACEMAKER INSERTION  2003  . PORTA CATH INSERTION N/A 11/21/2017   Procedure: PORTA CATH INSERTION;  Surgeon: Algernon Huxley, MD;  Location: [facility];  Service: Cardiovascular;  Laterality: N/A;   Family History  Problem Relation Age of Onset  . Cancer Mother   . Cancer Sister   . Cancer Maternal Grandmother    Social History   Tobacco Use  . Smoking status: Current Every Day Smoker    Packs/day: 1.00    Years: 55.00    Pack years: 55.00    Types: Cigarettes  . Smokeless tobacco: Never Used  Substance Use Topics  . Alcohol use: Yes    Comment: Occasional beer  . Drug use: No      Review of Systems Per HPI    Objective:   Physical Exam  Constitutional: He appears ill. No distress.  thin  HENT:  Head: Normocephalic and atraumatic.  Eyes: Conjunctivae are normal.  Cardiovascular: Normal rate and regular rhythm.  Pulmonary/Chest: Effort normal and breath sounds normal.  Ambulated in hallway, pulse ox 100%.   Neurological: He is alert.  Skin: Skin is warm and dry. He is not diaphoretic. There is pallor.  Psychiatric: He has a normal mood and affect. His behavior is normal. Judgment and thought content normal.  Vitals reviewed.    BP 96/66 (BP Location: Left Arm, Patient Position: Sitting, Cuff Size: Normal)   Pulse (!) 123   Temp 97.6 F (36.4 C) (Oral)   Ht 5\' 11"  (1.803 m)   Wt 121 lb 8 oz (55.1 kg)   SpO2 96%   BMI 16.95 kg/m  Wt Readings from Last 3 Encounters:  03/20/18 121 lb 8 oz (55.1 kg)  03/17/18 118 lb 14.4 oz (53.9 kg)  03/09/18 121 lb 4.8 oz (55 kg)            Assessment & Plan:  1. Anxiety - ALPRAZolam (XANAX) 0.25 MG tablet; Take 1 tablet (0.25 mg total) by mouth 2 (two) times daily as needed for anxiety.  Dispense: 60 tablet; Refill: 1  2. Muscle spasm - discussed  potential side effects of medication - encouraged ROM, heat prn - methocarbamol (ROBAXIN) 500 MG tablet; Take 1 tablet (500 mg total) by mouth every 8 (eight) hours as needed for muscle spasms.  Dispense: 60 tablet; Refill: 0  3. Anorexia - encouraged small, frequent meals, protein with meals/snacks - megestrol (MEGACE ORAL) 40 MG/ML suspension; Take 5 mLs (200 mg total) by mouth daily as needed (for appetite).  Dispense: 150 mL; Refill: 1  4. Bone metastasis ([place]) - discussed energy conversation - discussed palliative care/hospice consultation to help with goal setting for end of life, will consider placing referral when he returns from his trip - spoke with Vita Erm, patient's home health nurse about any additional needs- none at this time other than wheelchair for home use. Will provide order.    Clarene Reamer, FNP-BC  [facility]  03/21/2018 12:36 PM

## 2018-03-20 NOTE — Patient Instructions (Signed)
Good to see you today  Conserve your energy, stay on top of your constipation.    I will touch base with Patrick Hooper about getting you a wheelchair for home and palliative care consult  Have a great trip to [place]!!

## 2018-03-21 ENCOUNTER — Encounter: Payer: Self-pay | Admitting: Family Medicine

## 2018-03-22 ENCOUNTER — Other Ambulatory Visit: Payer: Self-pay | Admitting: Family Medicine

## 2018-03-22 DIAGNOSIS — C7951 Secondary malignant neoplasm of bone: Secondary | ICD-10-CM

## 2018-03-22 DIAGNOSIS — R531 Weakness: Secondary | ICD-10-CM

## 2018-03-22 DIAGNOSIS — C679 Malignant neoplasm of bladder, unspecified: Secondary | ICD-10-CM

## 2018-03-31 ENCOUNTER — Telehealth: Payer: Self-pay | Admitting: Family Medicine

## 2018-03-31 ENCOUNTER — Other Ambulatory Visit: Payer: Self-pay | Admitting: Family Medicine

## 2018-03-31 DIAGNOSIS — R531 Weakness: Secondary | ICD-10-CM

## 2018-03-31 DIAGNOSIS — C3411 Malignant neoplasm of upper lobe, right bronchus or lung: Secondary | ICD-10-CM

## 2018-03-31 DIAGNOSIS — C7951 Secondary malignant neoplasm of bone: Secondary | ICD-10-CM

## 2018-03-31 DIAGNOSIS — C672 Malignant neoplasm of lateral wall of bladder: Secondary | ICD-10-CM

## 2018-03-31 NOTE — Progress Notes (Signed)
a 

## 2018-03-31 NOTE — Telephone Encounter (Signed)
Copied from [place] [phone]. Topic: Quick Communication - See Telephone Encounter >> Mar 31, 2018  1:02 PM Arletha Grippe wrote: CRM for notification. See Telephone encounter for: 03/31/18.  Sig other called patricia graham - she was told that order needs to be placed for hospice care.  The order needs to be faxed to [facility] fax is [phone]. The nurse at [facility] told patricia that he is needing care that is above what home health can provide.   Please call patricia when this is done at [phone]

## 2018-03-31 NOTE — Telephone Encounter (Signed)
Please call patient or his SO, Patrick Hooper and ask if they want Hospice care from current home health provider or local Hospice provider.

## 2018-03-31 NOTE — Telephone Encounter (Signed)
Last seen 03/20/18.Please advise.

## 2018-03-31 NOTE — Telephone Encounter (Signed)
Called patients home, spoke with Mardene Celeste. Kaitlyn has had a nurse from [facility] for awhile now and feels comfortable with them. They would like the Hospice Care to come from [facility] . I called [facility] and spoke with Anabel Halon, you will continue to be patients PCP but if they need an order and you cant be reached then their Medical Director would sign the order. I faxed over the Hospice order and the last office note.

## 2018-04-01 ENCOUNTER — Inpatient Hospital Stay
Admission: EM | Admit: 2018-04-01 | Discharge: 2018-04-03 | DRG: 872 | Disposition: A | Discharge status: Home Health | Payer: Medicare Other | Attending: Internal Medicine | Admitting: Internal Medicine

## 2018-04-01 ENCOUNTER — Other Ambulatory Visit: Payer: Self-pay

## 2018-04-01 ENCOUNTER — Encounter: Payer: Self-pay | Admitting: Emergency Medicine

## 2018-04-01 ENCOUNTER — Emergency Department: Payer: Medicare Other

## 2018-04-01 DIAGNOSIS — Z79891 Long term (current) use of opiate analgesic: Secondary | ICD-10-CM

## 2018-04-01 DIAGNOSIS — C3490 Malignant neoplasm of unspecified part of unspecified bronchus or lung: Secondary | ICD-10-CM | POA: Diagnosis present

## 2018-04-01 DIAGNOSIS — Z7902 Long term (current) use of antithrombotics/antiplatelets: Secondary | ICD-10-CM

## 2018-04-01 DIAGNOSIS — C7989 Secondary malignant neoplasm of other specified sites: Secondary | ICD-10-CM | POA: Diagnosis present

## 2018-04-01 DIAGNOSIS — A419 Sepsis, unspecified organism: Principal | ICD-10-CM | POA: Diagnosis present

## 2018-04-01 DIAGNOSIS — R630 Anorexia: Secondary | ICD-10-CM | POA: Diagnosis not present

## 2018-04-01 DIAGNOSIS — Z79899 Other long term (current) drug therapy: Secondary | ICD-10-CM | POA: Diagnosis not present

## 2018-04-01 DIAGNOSIS — F17210 Nicotine dependence, cigarettes, uncomplicated: Secondary | ICD-10-CM | POA: Diagnosis present

## 2018-04-01 DIAGNOSIS — Z8551 Personal history of malignant neoplasm of bladder: Secondary | ICD-10-CM

## 2018-04-01 DIAGNOSIS — Z809 Family history of malignant neoplasm, unspecified: Secondary | ICD-10-CM

## 2018-04-01 DIAGNOSIS — Z66 Do not resuscitate: Secondary | ICD-10-CM | POA: Diagnosis present

## 2018-04-01 DIAGNOSIS — Z950 Presence of cardiac pacemaker: Secondary | ICD-10-CM | POA: Diagnosis not present

## 2018-04-01 DIAGNOSIS — G893 Neoplasm related pain (acute) (chronic): Secondary | ICD-10-CM | POA: Diagnosis present

## 2018-04-01 DIAGNOSIS — K5900 Constipation, unspecified: Secondary | ICD-10-CM | POA: Diagnosis present

## 2018-04-01 DIAGNOSIS — C679 Malignant neoplasm of bladder, unspecified: Secondary | ICD-10-CM | POA: Diagnosis present

## 2018-04-01 DIAGNOSIS — E860 Dehydration: Secondary | ICD-10-CM | POA: Diagnosis present

## 2018-04-01 DIAGNOSIS — Z885 Allergy status to narcotic agent status: Secondary | ICD-10-CM | POA: Diagnosis not present

## 2018-04-01 DIAGNOSIS — Z888 Allergy status to other drugs, medicaments and biological substances status: Secondary | ICD-10-CM

## 2018-04-01 DIAGNOSIS — Z79818 Long term (current) use of other agents affecting estrogen receptors and estrogen levels: Secondary | ICD-10-CM

## 2018-04-01 DIAGNOSIS — Z881 Allergy status to other antibiotic agents status: Secondary | ICD-10-CM | POA: Diagnosis not present

## 2018-04-01 DIAGNOSIS — I739 Peripheral vascular disease, unspecified: Secondary | ICD-10-CM | POA: Diagnosis present

## 2018-04-01 DIAGNOSIS — E871 Hypo-osmolality and hyponatremia: Secondary | ICD-10-CM | POA: Diagnosis present

## 2018-04-01 DIAGNOSIS — R7989 Other specified abnormal findings of blood chemistry: Secondary | ICD-10-CM

## 2018-04-01 DIAGNOSIS — J449 Chronic obstructive pulmonary disease, unspecified: Secondary | ICD-10-CM | POA: Diagnosis present

## 2018-04-01 DIAGNOSIS — C7951 Secondary malignant neoplasm of bone: Secondary | ICD-10-CM | POA: Diagnosis present

## 2018-04-01 DIAGNOSIS — Z85118 Personal history of other malignant neoplasm of bronchus and lung: Secondary | ICD-10-CM | POA: Diagnosis not present

## 2018-04-01 DIAGNOSIS — R109 Unspecified abdominal pain: Secondary | ICD-10-CM | POA: Diagnosis not present

## 2018-04-01 DIAGNOSIS — I10 Essential (primary) hypertension: Secondary | ICD-10-CM | POA: Diagnosis present

## 2018-04-01 DIAGNOSIS — Z9221 Personal history of antineoplastic chemotherapy: Secondary | ICD-10-CM | POA: Diagnosis not present

## 2018-04-01 DIAGNOSIS — Z923 Personal history of irradiation: Secondary | ICD-10-CM

## 2018-04-01 DIAGNOSIS — R112 Nausea with vomiting, unspecified: Secondary | ICD-10-CM

## 2018-04-01 DIAGNOSIS — C786 Secondary malignant neoplasm of retroperitoneum and peritoneum: Secondary | ICD-10-CM | POA: Diagnosis present

## 2018-04-01 LAB — LACTIC ACID, PLASMA
Lactic Acid, Venous: 2.4 mmol/L (ref 0.5–1.9)
Lactic Acid, Venous: 1.8 mmol/L (ref 0.5–1.9)

## 2018-04-01 LAB — URINALYSIS, ROUTINE W REFLEX MICROSCOPIC
BACTERIA UA: NONE SEEN
BILIRUBIN URINE: NEGATIVE
Glucose, UA: NEGATIVE mg/dL
KETONES UR: 20 mg/dL — AB
Nitrite: NEGATIVE
Protein, ur: 30 mg/dL — AB
Specific Gravity, Urine: >1.046 — ABNORMAL HIGH (ref 1.005–1.030)
pH: 5 (ref 5.0–8.0)

## 2018-04-01 LAB — COMPREHENSIVE METABOLIC PANEL
ALT: 15 U/L — AB (ref 17–63)
ANION GAP: 14 (ref 5–15)
AST: 33 U/L (ref 15–41)
Albumin: 2.6 g/dL — ABNORMAL LOW (ref 3.5–5.0)
Alkaline Phosphatase: 106 U/L (ref 38–126)
BUN: 12 mg/dL (ref 6–20)
CHLORIDE: 90 mmol/L — AB (ref 101–111)
CO2: 22 mmol/L (ref 22–32)
CREATININE: 0.65 mg/dL (ref 0.61–1.24)
Calcium: 8.3 mg/dL — ABNORMAL LOW (ref 8.9–10.3)
GFR calc Af Amer: >60 mL/min (ref >60)
Glucose, Bld: 96 mg/dL (ref 65–99)
POTASSIUM: 4 mmol/L (ref 3.5–5.1)
SODIUM: 126 mmol/L — AB (ref 135–145)
Total Bilirubin: 0.8 mg/dL (ref 0.3–1.2)
Total Protein: 7.1 g/dL (ref 6.5–8.1)

## 2018-04-01 LAB — CBC
HEMATOCRIT: 38.9 % — AB (ref 40.0–52.0)
HEMOGLOBIN: 12.9 g/dL — AB (ref 13.0–18.0)
MCH: 30.9 pg (ref 26.0–34.0)
MCHC: 33.1 g/dL (ref 32.0–36.0)
MCV: 93.6 fL (ref 80.0–100.0)
PLATELETS: 227 10*3/uL (ref 150–440)
RBC: 4.16 MIL/uL — AB (ref 4.40–5.90)
RDW: 20.2 % — ABNORMAL HIGH (ref 11.5–14.5)
WBC: 22.8 10*3/uL — AB (ref 3.8–10.6)

## 2018-04-01 LAB — LIPASE, BLOOD: LIPASE: 17 U/L (ref 11–51)

## 2018-04-01 MED ORDER — ACETAMINOPHEN 650 MG RE SUPP: 650.0000 mg | Freq: Four times a day (QID) | RECTAL | Status: DC | PRN

## 2018-04-01 MED ORDER — CLOPIDOGREL BISULFATE 75 MG PO TABS
75.0000 mg | ORAL_TABLET | Freq: Every day | ORAL | Status: DC
Start: 2018-04-01 — End: 2018-04-03
  Administered 2018-04-01 – 2018-04-02 (×2): 75 mg via ORAL
  Filled 2018-04-01 (×2): qty 1

## 2018-04-01 MED ORDER — POTASSIUM CHLORIDE CRYS ER 20 MEQ PO TBCR
20.0000 meq | EXTENDED_RELEASE_TABLET | Freq: Every day | ORAL | Status: DC
  Administered 2018-04-01 – 2018-04-03 (×3): 20 meq via ORAL
  Filled 2018-04-01 (×4): qty 1

## 2018-04-01 MED ORDER — OXYCODONE HCL 5 MG PO CAPS: 5.0000 mg | ORAL_CAPSULE | ORAL | Status: DC | PRN

## 2018-04-01 MED ORDER — DICYCLOMINE HCL 10 MG/ML IM SOLN
20.0000 mg | Freq: Once | INTRAMUSCULAR | Status: AC
  Administered 2018-04-01: 20 mg via INTRAMUSCULAR
  Filled 2018-04-01 (×2): qty 2

## 2018-04-01 MED ORDER — SODIUM CHLORIDE 0.9 % IV BOLUS
1000.0000 mL | Freq: Once | INTRAVENOUS | Status: AC
  Administered 2018-04-01: 1000 mL via INTRAVENOUS

## 2018-04-01 MED ORDER — MEGESTROL ACETATE 40 MG/ML PO SUSP
200.0000 mg | Freq: Every day | ORAL | Status: DC | PRN
  Filled 2018-04-01: qty 5

## 2018-04-01 MED ORDER — NICOTINE 21 MG/24HR TD PT24
21.0000 mg | MEDICATED_PATCH | Freq: Every day | TRANSDERMAL | Status: DC
  Administered 2018-04-01 – 2018-04-03 (×3): 21 mg via TRANSDERMAL
  Filled 2018-04-01 (×4): qty 1

## 2018-04-01 MED ORDER — CHOLECALCIFEROL 10 MCG (400 UNIT) PO TABS
800.0000 [IU] | ORAL_TABLET | Freq: Every day | ORAL | Status: DC
  Administered 2018-04-01 – 2018-04-02 (×2): 800 [IU] via ORAL
  Filled 2018-04-01 (×4): qty 2

## 2018-04-01 MED ORDER — SODIUM CHLORIDE 0.9 % IV SOLN
2.0000 g | Freq: Three times a day (TID) | INTRAVENOUS | Status: DC
  Administered 2018-04-01 – 2018-04-02 (×3): 2 g via INTRAVENOUS
  Filled 2018-04-01 (×7): qty 2

## 2018-04-01 MED ORDER — ACETAMINOPHEN 325 MG PO TABS: 650.0000 mg | ORAL_TABLET | Freq: Four times a day (QID) | ORAL | Status: DC | PRN

## 2018-04-01 MED ORDER — ONDANSETRON HCL 4 MG PO TABS
4.0000 mg | ORAL_TABLET | Freq: Four times a day (QID) | ORAL | Status: DC | PRN
  Filled 2018-04-01: qty 1

## 2018-04-01 MED ORDER — PREMIER PROTEIN SHAKE
11.0000 [oz_av] | Freq: Two times a day (BID) | ORAL | Status: DC
  Administered 2018-04-02 – 2018-04-03 (×2): 11 [oz_av] via ORAL

## 2018-04-01 MED ORDER — VANCOMYCIN HCL IN DEXTROSE 1-5 GM/200ML-% IV SOLN
1000.0000 mg | Freq: Once | INTRAVENOUS | Status: AC
  Administered 2018-04-01: 1000 mg via INTRAVENOUS
  Filled 2018-04-01: qty 200

## 2018-04-01 MED ORDER — IOHEXOL 300 MG/ML  SOLN
75.0000 mL | Freq: Once | INTRAMUSCULAR | Status: AC | PRN
  Administered 2018-04-01: 75 mL via INTRAVENOUS

## 2018-04-01 MED ORDER — VANCOMYCIN HCL IN DEXTROSE 750-5 MG/150ML-% IV SOLN
750.0000 mg | Freq: Two times a day (BID) | INTRAVENOUS | Status: DC
  Administered 2018-04-01 – 2018-04-02 (×2): 750 mg via INTRAVENOUS
  Filled 2018-04-01 (×4): qty 150

## 2018-04-01 MED ORDER — SODIUM CHLORIDE 0.9 % IV SOLN
2.0000 g | Freq: Once | INTRAVENOUS | Status: AC
  Administered 2018-04-01: 2 g via INTRAVENOUS
  Filled 2018-04-01: qty 2

## 2018-04-01 MED ORDER — LACTULOSE 10 GM/15ML PO SOLN: 30.0000 g | Freq: Two times a day (BID) | ORAL | Status: DC | PRN

## 2018-04-01 MED ORDER — LEVOFLOXACIN IN D5W 750 MG/150ML IV SOLN
750.0000 mg | Freq: Every day | INTRAVENOUS | Status: DC
Start: 2018-04-01 — End: 2018-04-03
  Administered 2018-04-01 – 2018-04-02 (×2): 750 mg via INTRAVENOUS
  Filled 2018-04-01 (×3): qty 150

## 2018-04-01 MED ORDER — ALPRAZOLAM 0.25 MG PO TABS
0.2500 mg | ORAL_TABLET | Freq: Two times a day (BID) | ORAL | Status: DC | PRN
  Administered 2018-04-01 – 2018-04-03 (×3): 0.25 mg via ORAL
  Filled 2018-04-01 (×3): qty 1

## 2018-04-01 MED ORDER — DOCUSATE SODIUM 100 MG PO CAPS: 100.0000 mg | ORAL_CAPSULE | Freq: Two times a day (BID) | ORAL | Status: DC | PRN

## 2018-04-01 MED ORDER — SODIUM CHLORIDE 0.9 % IV SOLN
INTRAVENOUS | Status: DC
  Administered 2018-04-01 – 2018-04-03 (×3): via INTRAVENOUS

## 2018-04-01 MED ORDER — MORPHINE SULFATE ER 15 MG PO TBCR
15.0000 mg | EXTENDED_RELEASE_TABLET | Freq: Two times a day (BID) | ORAL | Status: DC
  Administered 2018-04-01 – 2018-04-03 (×4): 15 mg via ORAL
  Filled 2018-04-01 (×4): qty 1

## 2018-04-01 MED ORDER — OXYCODONE HCL 5 MG PO TABS
5.0000 mg | ORAL_TABLET | ORAL | Status: DC | PRN
  Administered 2018-04-01 – 2018-04-02 (×2): 5 mg via ORAL
  Filled 2018-04-01 (×2): qty 1

## 2018-04-01 MED ORDER — LEVOFLOXACIN IN D5W 750 MG/150ML IV SOLN
750.0000 mg | Freq: Once | INTRAVENOUS | Status: AC
  Administered 2018-04-01: 750 mg via INTRAVENOUS
  Filled 2018-04-01: qty 150

## 2018-04-01 MED ORDER — ENOXAPARIN SODIUM 40 MG/0.4ML ~~LOC~~ SOLN
40.0000 mg | SUBCUTANEOUS | Status: DC
  Administered 2018-04-01: 40 mg via SUBCUTANEOUS
  Filled 2018-04-01: qty 0.4

## 2018-04-01 NOTE — ED Notes (Signed)
Date and time results received: 04/01/18  1245 (use smartphrase ".now" to insert current time)  Test: Lactic Critical Value: 2.4  Name of Provider Notified: Dr. Archie Balboa  Orders Received? Or Actions Taken?: Orders Received - See Orders for details

## 2018-04-01 NOTE — Progress Notes (Signed)
Advanced care plan. Purpose of the Encounter: CODE STATUS Parties in Attendance: Patient and family Patient's Decision Capacity: Good Subjective/Patient's story: Presented with abdominal discomfort, nausea and vomiting Objective/Medical story Has intra-abdominal infection with progression of disease Needs IV fluids and antibiotics Goals of care determination:  Advanced directives and goals of care discussed in the context of metastatic cancer Patient does not want any cardiac resuscitation, intubation and ventilator if the need arises Goals met CODE STATUS: DNR Time spent discussing advanced care planning: 16 minutes

## 2018-04-01 NOTE — H&P (Signed)
Patrick Hooper NAME: Patrick Hooper    MR#:  [id number]  DATE OF BIRTH:  [birth year]  DATE OF ADMISSION:  04/01/2018  PRIMARY CARE PHYSICIAN: Elby Beck, FNP   REQUESTING/REFERRING PHYSICIAN:   CHIEF COMPLAINT:   Chief Complaint  Patient presents with  . Abdominal Pain  . Emesis    HISTORY OF PRESENT ILLNESS: Patrick Hooper  is a 72 y.o. male with a known history of lung cancer, bladder cancer with metastasis, hypertension, peripheral vascular disease, active tobacco abuse presented to the emergency room with abdominal discomfort, nausea and vomiting for 1 week.  Abdominal discomfort has been worse since yesterday and is been aching in nature 7 out of 10 on a scale of 1-10.  Patient also has some nausea and vomiting vomitus contained food and water.  No complaints of any chest pain, shortness of breath.  Patient still actively smokes.  Patient was worked up with CT abdomen in the emergency room which showed diffuse osseous mets along with increased lymphadenopathy within the retroperitoneum, along the mesentery with progression of disease.  Patient currently has stage IV cancer.  PAST MEDICAL HISTORY:   Past Medical History:  Diagnosis Date  . Bladder cancer ([place])   . Hypertension   . Lung cancer ([place]) 2016  . PVD (peripheral vascular disease) ([place])     PAST SURGICAL HISTORY:  Past Surgical History:  Procedure Laterality Date  . BLADDER SURGERY  08/12/2017  . BYPASS GRAFT FEMORAL/POPLITEAL W/VEIN  2009  . COLONOSCOPY  2013  . CYSTOSCOPY  08/12/2017  . IR ABDOMINAL AORTOBIFEMORAL CATHETER SERIALOGRAM  2009  . LOBECTOMY Right 09/2015  . PACEMAKER INSERTION  2003  . PORTA CATH INSERTION N/A 11/21/2017   Procedure: PORTA CATH INSERTION;  Surgeon: Algernon Huxley, MD;  Location: [facility];  Service: Cardiovascular;  Laterality: N/A;    SOCIAL HISTORY:  Social History   Tobacco Use  . Smoking status: Current  Every Day Smoker    Packs/day: 1.00    Years: 55.00    Pack years: 55.00    Types: Cigarettes  . Smokeless tobacco: Never Used  Substance Use Topics  . Alcohol use: Yes    Comment: Occasional beer    FAMILY HISTORY:  Family History  Problem Relation Age of Onset  . Cancer Mother   . Cancer Sister   . Cancer Maternal Grandmother     DRUG ALLERGIES:  Allergies  Allergen Reactions  . Cefazolin Rash and Shortness Of Breath  . Carvedilol Other (See Comments)    fatigue  . Chlorthalidone Other (See Comments)    Other reaction(s): Unknown  . Hydralazine     Other reaction(s): Unknown  . Hydrochlorothiazide Other (See Comments)  . Lortab [Hydrocodone-Acetaminophen]     Vivid dreaming    REVIEW OF SYSTEMS:   CONSTITUTIONAL: Low grade fever,has fatigue and weakness.  EYES: No blurred or double vision.  EARS, NOSE, AND THROAT: No tinnitus or ear pain.  RESPIRATORY: No cough, shortness of breath, wheezing or hemoptysis.  CARDIOVASCULAR: No chest pain, orthopnea, edema.  GASTROINTESTINAL: Has nausea, vomiting,abdominal pain.  No diarrhea GENITOURINARY: No dysuria, hematuria.  ENDOCRINE: No polyuria, nocturia,  HEMATOLOGY: No anemia, easy bruising or bleeding SKIN: No rash or lesion. MUSCULOSKELETAL: No joint pain or arthritis.   NEUROLOGIC: No tingling, numbness, weakness.  PSYCHIATRY: No anxiety or depression.   MEDICATIONS AT HOME:  Prior to Admission medications   Medication Sig Start Date End  Date Taking? Authorizing Provider  acetaminophen (TYLENOL) 500 MG tablet Take 500 mg by mouth every 6 (six) hours as needed for mild pain.    Yes [provider]  ALPRAZolam (XANAX) 0.25 MG tablet Take 1 tablet (0.25 mg total) by mouth 2 (two) times daily as needed for anxiety. 03/20/18  Yes Elby Beck, FNP  cholecalciferol (VITAMIN D) 400 units TABS tablet Take 800 Units by mouth daily.   Yes [provider]  clopidogrel (PLAVIX) 75 MG tablet Take 1  tablet (75 mg total) by mouth at bedtime. 12/07/17  Yes Elby Beck, FNP  docusate sodium (COLACE) 100 MG capsule Take 100 mg by mouth 2 (two) times daily as needed for mild constipation or moderate constipation.    Yes [provider]  lactulose (CHRONULAC) 10 GM/15ML solution Take 45 mLs (30 g total) by mouth 2 (two) times daily as needed for mild constipation or severe constipation. 10/14/17  Yes Loletha Grayer, MD  megestrol (MEGACE ORAL) 40 MG/ML suspension Take 5 mLs (200 mg total) by mouth daily as needed (for appetite). 03/20/18  Yes Elby Beck, FNP  morphine (MS CONTIN) 15 MG 12 hr tablet Take 1 tablet (15 mg total) by mouth every 12 (twelve) hours. 03/20/18  Yes Corcoran, Drue Second, MD  ondansetron (ZOFRAN) 4 MG tablet Take 1 tablet (4 mg total) by mouth every 6 (six) hours as needed for nausea. 10/14/17  Yes Wieting, Richard, MD  oxycodone (OXY-IR) 5 MG capsule Take 1 capsule (5 mg total) by mouth every 4 (four) hours as needed for pain. 03/20/18  Yes Corcoran, Drue Second, MD  polyethylene glycol (MIRALAX / GLYCOLAX) packet Take 17 g by mouth daily. Patient taking differently: Take 17 g by mouth daily as needed for mild constipation.  10/15/17  Yes Wieting, Richard, MD  potassium chloride SA (K-DUR,KLOR-CON) 20 MEQ tablet Take 1 tablet (20 mEq total) by mouth daily. 01/26/18  Yes [name], NP  protein supplement shake (PREMIER PROTEIN) LIQD Take 325 mLs (11 oz total) by mouth 2 (two) times daily between meals. 10/14/17  Yes Wieting, Richard, MD  methocarbamol (ROBAXIN) 500 MG tablet Take 1 tablet (500 mg total) by mouth every 8 (eight) hours as needed for muscle spasms. Patient not taking: Reported on 04/01/2018 03/20/18   Elby Beck, FNP      PHYSICAL EXAMINATION:   VITAL SIGNS: Blood pressure 123/81, pulse (!) 116, temperature (!) 97.4 F (36.3 C), resp. rate 19, height 5\' 11"  (1.803 m), weight 54.4 kg (120 lb), SpO2 96 %.  GENERAL:  72 y.o.-year-old  patient lying in the bed with no acute distress.  EYES: Pupils equal, round, reactive to light and accommodation. No scleral icterus. Extraocular muscles intact.  HEENT: Head atraumatic, normocephalic. Oropharynx dry and nasopharynx clear.  NECK:  Supple, no jugular venous distention. No thyroid enlargement, no tenderness.  LUNGS: Decreased breath sounds right lung, basal crepitations right lung. No use of accessory muscles of respiration.  CARDIOVASCULAR: S1, S2 normal. No murmurs, rubs, or gallops.  ABDOMEN: Soft, tenderness around umbilicus, nondistended. Bowel sounds present. No organomegaly or mass.  EXTREMITIES: No pedal edema, cyanosis, or clubbing.  NEUROLOGIC: Cranial nerves II through XII are intact. Muscle strength 5/5 in all extremities. Sensation intact. Gait not checked.  PSYCHIATRIC: The patient is alert and oriented x 3.  SKIN: No obvious rash, lesion, or ulcer.   LABORATORY PANEL:   CBC Recent Labs  Lab 04/01/18 0956  WBC 22.8*  HGB 12.9*  HCT 38.9*  PLT 227  MCV 93.6  MCH 30.9  MCHC 33.1  RDW 20.2*   ------------------------------------------------------------------------------------------------------------------  Chemistries  Recent Labs  Lab 04/01/18 0956  NA 126*  K 4.0  CL 90*  CO2 22  GLUCOSE 96  BUN 12  CREATININE 0.65  CALCIUM 8.3*  AST 33  ALT 15*  ALKPHOS 106  BILITOT 0.8   ------------------------------------------------------------------------------------------------------------------ estimated creatinine clearance is 65.2 mL/min (by C-G formula based on SCr of 0.65 mg/dL). ------------------------------------------------------------------------------------------------------------------ No results for input(s): TSH, T4TOTAL, T3FREE, THYROIDAB in the last 72 hours.  Invalid input(s): FREET3   Coagulation profile No results for input(s): INR, PROTIME in the last 168  hours. ------------------------------------------------------------------------------------------------------------------- No results for input(s): DDIMER in the last 72 hours. -------------------------------------------------------------------------------------------------------------------  Cardiac Enzymes No results for input(s): CKMB, TROPONINI, MYOGLOBIN in the last 168 hours.  Invalid input(s): CK ------------------------------------------------------------------------------------------------------------------ Invalid input(s): POCBNP  ---------------------------------------------------------------------------------------------------------------  Urinalysis    Component Value Date/Time   COLORURINE YELLOW (A) 03/16/2018 2126   APPEARANCEUR HAZY (A) 03/16/2018 2126   APPEARANCEUR Clear 03/09/2018 1610   LABSPEC 1.021 03/16/2018 2126   PHURINE 5.0 03/16/2018 2126   GLUCOSEU NEGATIVE 03/16/2018 2126   HGBUR SMALL (A) 03/16/2018 2126   BILIRUBINUR NEGATIVE 03/16/2018 2126   BILIRUBINUR Negative 03/09/2018 1610   KETONESUR 20 (A) 03/16/2018 2126   PROTEINUR 30 (A) 03/16/2018 2126   NITRITE NEGATIVE 03/16/2018 2126   LEUKOCYTESUR SMALL (A) 03/16/2018 2126   LEUKOCYTESUR 1+ (A) 03/09/2018 1610     RADIOLOGY: Ct Abdomen Pelvis W Contrast  Result Date: 04/01/2018 CLINICAL DATA:  Abdominal pain for 4 days with nausea, vomiting and constipation. History of stage IV bladder cancer currently undergoing therapy with Hematology Oncology. Osseous metastasis, per patient. Additional history of RIGHT-sided lung cancer in 2016 with partial upper lobectomy. EXAM: CT ABDOMEN AND PELVIS WITH CONTRAST TECHNIQUE: Multidetector CT imaging of the abdomen and pelvis was performed using the standard protocol following bolus administration of intravenous contrast. CONTRAST:  88mL OMNIPAQUE IOHEXOL 300 MG/ML  SOLN COMPARISON:  CT abdomen dated 11/28/2017.  PET-CT dated 01/23/2018. FINDINGS: Lower chest:  New pleural effusion at the RIGHT lung base, at least moderate in size, incompletely imaged, with associated compressive atelectasis. LEFT lung base is clear. Hepatobiliary: No focal liver abnormality is seen. No gallstones, gallbladder wall thickening, or biliary dilatation. Pancreas: Unremarkable. No pancreatic ductal dilatation or surrounding inflammatory changes. Spleen: Normal in size without focal abnormality. Adrenals/Urinary Tract: Adrenal glands are unremarkable. Kidneys are normal, without renal calculi, focal lesion, or hydronephrosis. Stomach/Bowel: No dilated large or small bowel loops. Stomach is unremarkable, partially decompressed. Questionable circumferential thickening of the walls of the gastric antrum, possibly artifactual due to the gastric decompression. Vascular/Lymphatic: New conglomerate lymphadenopathy within the upper abdominal mesentery, just below the level of the pancreas, new compared to the earlier head CT of 01/23/2018 indicating progression of disease. Additional new lymphadenopathy within the aortocaval and LEFT periaortic retroperitoneum, also indicating progression of disease. Aortic atherosclerosis. Surgical changes of previous bilateral aortofemoral bypass. Reproductive: Increased masslike density at the bladder base, now measuring approximately 5 cm greatest dimension, compatible with the given history of bladder cancer. Other: Small amount of free fluid in the pelvis and bilateral pericolic gutters. No circumscribed abscess collection or free intraperitoneal air. Musculoskeletal: Diffuse osseous metastases throughout the thoracolumbar spine and osseous pelvis. IMPRESSION: 1. New conglomerate lymphadenopathy within the upper abdominal mesentery, just below the level of the pancreas, and within the retroperitoneum, indicating progression of disease compared to the PET-CT of 01/23/2018.  2. New RIGHT pleural effusion, at least moderate in size, incompletely imaged, with  associated compressive atelectasis. 3. Diffuse osseous metastases, as previously described. 4. Increased masslike thickening at the bladder base, compatible with the given history of bladder cancer. 5. Questionable thickening of the walls of the gastric antrum, more likely artifactual due to the gastric decompression, infectious gastritis or neoplastic wall thickening considered less likely. No bowel obstruction. 6. Small amount of free fluid in the abdomen and pelvis. No abscess collection. 7. Aortic atherosclerosis. Electronically Signed   By: Franki Cabot M.D.   On: 04/01/2018 12:34    EKG: Orders placed or performed during the hospital encounter of 04/01/18  . ED EKG 12-Lead  . ED EKG 12-Lead    IMPRESSION AND PLAN:  72 year old male patient with history of bladder cancer, lung cancer with metastasis, hypertension, tobacco abuse presented to the emergency room with abdominal discomfort, nausea and vomiting.  -Sepsis Intra-abdominal source Start patient on IV vancomycin and IV aztreonam and IV Levaquin antibiotics Follow-up cultures  -Abdominal pain secondary to progression of cancer Pain management  -Intractable nausea and vomiting Antiemetics  -Dehydration IV fluids  -Right lung pleural effusion secondary to malignancy  -Metastatic cancer Overall prognosis very poor Oncology consultation Palliative care consult  -Tobacco cessation counseled to the patient 6 minutes Nicotine patch offered  All the records are reviewed and case discussed with ED provider. Management plans discussed with the patient, family and they are in agreement.  CODE STATUS:DNR    Code Status Orders  (From admission, onward)        Start     Ordered   04/01/18 1423  Do not attempt resuscitation (DNR)  Continuous    Question Answer Comment  In the event of cardiac or respiratory ARREST Do not call a "code blue"   In the event of cardiac or respiratory ARREST Do not perform Intubation, CPR,  defibrillation or ACLS   In the event of cardiac or respiratory ARREST Use medication by any route, position, wound care, and other measures to relive pain and suffering. May use oxygen, suction and manual treatment of airway obstruction as needed for comfort.      04/01/18 1422    Code Status History    Date Active Date Inactive Code Status Order ID Comments User Context   03/17/2018 0309 03/17/2018 1740 Full Code [id number]  Harrie Foreman, MD Inpatient   10/12/2017 2354 10/14/2017 1906 Full Code [id number]  Dustin Flock, MD Inpatient       TOTAL TIME TAKING CARE OF THIS PATIENT: 54 minutes.    Saundra Shelling M.D on 04/01/2018 at 2:24 PM  Between 7am to 6pm - Pager - [phone]  After 6pm go to [email or web address] - password [email or web address] [facility]  Office  [phone]  CC: Primary care physician; Elby Beck, FNP

## 2018-04-01 NOTE — ED Triage Notes (Signed)
Abdominal pain x 4 days. Nausea and vomiting since yesterday.

## 2018-04-01 NOTE — ED Provider Notes (Signed)
[facility] Emergency Department Provider Note  ____________________________________________   I have reviewed the triage vital signs and the nursing notes.   HISTORY  Chief Complaint Abdominal Pain and Emesis   History limited by: Not Limited   HPI Patrick Hooper is a 72 y.o. male who presents to the emergency department today because of concern for nausea, vomiting and abdominal pain. The symptoms started yesterday. He has had multiple episodes of nausea and vomiting. The patient states the emesis has been dark. Does have history of constipation secondary to narcotic pain medication use however states that it feels a little different. The nausea and vomiting is accompanied by abdominal pain and bloating. Located in the center of the abdomen. The bloating and pain is relieved when the patient has an episode of emesis. Had a temperature of 99 yesterday.    Per medical record review patient has a history of  cancer, recent ER visit, found to be hyponatremic.   Past Medical History:  Diagnosis Date  . Bladder cancer ([place])   . Hypertension   . Lung cancer ([place]) 2016  . PVD (peripheral vascular disease) [facility])     Patient Active Problem List   Diagnosis Date Noted  . Generalized weakness 03/17/2018  . Hyponatremia 03/16/2018  . Anxiety 03/05/2018  . Hypomagnesemia 03/05/2018  . Hypokalemia 01/28/2018  . Insomnia 01/28/2018  . Low vitamin B12 level 12/15/2017  . Leukopenia 12/01/2017  . Encounter for antineoplastic chemotherapy 11/03/2017  . Pressure injury of skin 10/13/2017  . Hypotension 10/13/2017  . Weakness 10/12/2017  . Cancer-related pain 09/27/2017  . 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 Goals of care, counseling/discussion 09/12/2017  . Malignant neoplasm of lateral wall of urinary bladder ([place]) 09/07/2017  . Malignant neoplasm of upper lobe of right lung ([place]) 09/07/2017  . Bone metastasis ([place]) 09/07/2017    Past  Surgical History:  Procedure Laterality Date  . BLADDER SURGERY  08/12/2017  . BYPASS GRAFT FEMORAL/POPLITEAL W/VEIN  2009  . COLONOSCOPY  2013  . CYSTOSCOPY  08/12/2017  . IR ABDOMINAL AORTOBIFEMORAL CATHETER SERIALOGRAM  2009  . LOBECTOMY Right 09/2015  . PACEMAKER INSERTION  2003  . PORTA CATH INSERTION N/A 11/21/2017   Procedure: PORTA CATH INSERTION;  Surgeon: Algernon Huxley, MD;  Location: [facility];  Service: Cardiovascular;  Laterality: N/A;    Prior to Admission medications   Medication Sig Start Date End Date Taking? Authorizing Provider  acetaminophen (TYLENOL) 500 MG tablet Take 500 mg by mouth every 6 (six) hours as needed for mild pain.     [provider]  ALPRAZolam Duanne Moron) 0.25 MG tablet Take 1 tablet (0.25 mg total) by mouth 2 (two) times daily as needed for anxiety. 03/20/18   Elby Beck, FNP  cholecalciferol (VITAMIN D) 400 units TABS tablet Take 800 Units by mouth daily.    [provider]  clopidogrel (PLAVIX) 75 MG tablet Take 1 tablet (75 mg total) by mouth at bedtime. 12/07/17   Elby Beck, FNP  docusate sodium (COLACE) 100 MG capsule Take 100 mg by mouth 2 (two) times daily as needed for mild constipation or moderate constipation.     [provider]  lactulose (CHRONULAC) 10 GM/15ML solution Take 45 mLs (30 g total) by mouth 2 (two) times daily as needed for mild constipation or severe constipation. 10/14/17   Loletha Grayer, MD  megestrol (MEGACE ORAL) 40 MG/ML suspension Take 5 mLs (200 mg total) by mouth daily  as needed (for appetite). 03/20/18   Elby Beck, FNP  methocarbamol (ROBAXIN) 500 MG tablet Take 1 tablet (500 mg total) by mouth every 8 (eight) hours as needed for muscle spasms. 03/20/18   Elby Beck, FNP  morphine (MS CONTIN) 15 MG 12 hr tablet Take 1 tablet (15 mg total) by mouth every 12 (twelve) hours. 03/20/18   Lequita Asal, MD  ondansetron (ZOFRAN) 4 MG tablet Take 1 tablet (4  mg total) by mouth every 6 (six) hours as needed for nausea. 10/14/17   Loletha Grayer, MD  oxycodone (OXY-IR) 5 MG capsule Take 1 capsule (5 mg total) by mouth every 4 (four) hours as needed for pain. 03/20/18   Lequita Asal, MD  polyethylene glycol (MIRALAX / GLYCOLAX) packet Take 17 g by mouth daily. 10/15/17   Loletha Grayer, MD  potassium chloride SA (K-DUR,KLOR-CON) 20 MEQ tablet Take 1 tablet (20 mEq total) by mouth daily. 01/26/18   [name], NP  protein supplement shake (PREMIER PROTEIN) LIQD Take 325 mLs (11 oz total) by mouth 2 (two) times daily between meals. 10/14/17   Loletha Grayer, MD    Allergies Cefazolin; Carvedilol; Chlorthalidone; Hydralazine; Hydrochlorothiazide; and Lortab [hydrocodone-acetaminophen]  Family History  Problem Relation Age of Onset  . Cancer Mother   . Cancer Sister   . Cancer Maternal Grandmother     Social History Social History   Tobacco Use  . Smoking status: Current Every Day Smoker    Packs/day: 1.00    Years: 55.00    Pack years: 55.00    Types: Cigarettes  . Smokeless tobacco: Never Used  Substance Use Topics  . Alcohol use: Yes    Comment: Occasional beer  . Drug use: No    Review of Systems Constitutional: No fever/chills Eyes: No visual changes. ENT: No sore throat. Cardiovascular: Denies chest pain. Respiratory: Denies shortness of breath. Gastrointestinal: Positive for abdominal pain, bloating, nausea and vomiting.  Genitourinary: Negative for dysuria. Musculoskeletal: Negative for back pain. Skin: Negative for rash. Neurological: Negative for headaches, focal weakness or numbness.  ____________________________________________   PHYSICAL EXAM:  VITAL SIGNS: ED Triage Vitals  Enc Vitals Group     BP 04/01/18 0945 127/72     Pulse Rate 04/01/18 0945 (!) 138     Resp 04/01/18 0945 20     Temp 04/01/18 0945 (!) 97.4 F (36.3 C)     Temp src --      SpO2 04/01/18 0945 97 %     Weight 04/01/18  0947 120 lb (54.4 kg)     Height 04/01/18 0947 5\' 11"  (1.803 m)     Head Circumference --      Peak Flow --      Pain Score 04/01/18 0947 7   Constitutional: Alert and oriented.  Eyes: Conjunctivae are normal.  ENT   Head: Normocephalic and atraumatic.   Nose: No congestion/rhinnorhea.   Mouth/Throat: Mucous membranes are moist.   Neck: No stridor. Hematological/Lymphatic/Immunilogical: No cervical lymphadenopathy. Cardiovascular: Normal rate, regular rhythm.  No murmurs, rubs, or gallops. Respiratory: Normal respiratory effort without tachypnea nor retractions. Breath sounds are clear and equal bilaterally. No wheezes/rales/rhonchi. Gastrointestinal: Soft and minimally tender to palpation. No rebound. No guarding.  Genitourinary: Deferred Musculoskeletal: Normal range of motion in all extremities. No lower extremity edema. Neurologic:  Normal speech and language. No gross focal neurologic deficits are appreciated.  Skin:  Skin is warm, dry and intact. No rash noted. Psychiatric: Mood and affect are  normal. Speech and behavior are normal. Patient exhibits appropriate insight and judgment.  ____________________________________________    LABS (pertinent positives/negatives)  Lipase 17 CMP na 126, chloride 90, glu 96, cr 0.65 CBC wbc 22.8, hgb 12.9, plt 227  ____________________________________________   EKG  None  ____________________________________________    RADIOLOGY  Ct abd/pel Progression of cancer. No SBO.  ____________________________________________   PROCEDURES  Procedures    ____________________________________________   INITIAL IMPRESSION / ASSESSMENT AND PLAN / ED COURSE  Pertinent labs & imaging results that were available during my care of the patient were reviewed by me and considered in my medical decision making (see chart for details).  Patient presented to the emergency department today because of concern for nausea  vomiting and abdominal pain. ddx would be broad including, gastroenteritis, SBO, narcotic side effect, AAA, mesenteric ischemia amongst other etiologies. The patient had elevated WBC and lactic acid as well as tachycardia. CT abd/pel was performed which showed some progression of the patient's cancer but no obvious etiology of the symptoms. Given lab findings and tachycardia will start on broad spectrum antibiotics. Will plan on admission. Discussed plan and findings with patient.    ____________________________________________   FINAL CLINICAL IMPRESSION(S) / ED DIAGNOSES  Final diagnoses:  Nausea and vomiting, intractability of vomiting not specified, unspecified vomiting type  Abdominal pain, unspecified abdominal location  Elevated lactic acid level     Note: This dictation was prepared with Dragon dictation. Any transcriptional errors that result from this process are unintentional     Nance Pear, MD 04/01/18 1311

## 2018-04-01 NOTE — ED Notes (Signed)
Attempted to call report x 1  

## 2018-04-01 NOTE — Progress Notes (Signed)
Pharmacy Antibiotic Note  Patrick Hooper is a 72 y.o. male admitted on 04/01/2018 with sepsis and suspected intra-abdominal infection. 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 Pharmacy has been consulted for Aztreonam, levofloxacin, and vancomycin dosing.  Plan: Levofloxacin 750 mg daily Aztreonam 2 g Q8H Vancomycin 750 IV every 12 hours.  Goal trough 15-20 mcg/mL.  Pharmacy will obtain trough prior to 4th dose. Will continue to follow.   Ke = 0.058 T1/2 = 11.95 Vd = 38 Cmin = 19 Stack dose @ 8 hours  Height: 5\' 11"  (180.3 cm) Weight: 120 lb (54.4 kg) IBW/kg (Calculated) : 75.3  Temp (24hrs), Avg:97.4 F (36.3 C), Min:97.4 F (36.3 C), Max:97.4 F (36.3 C)  Recent Labs  Lab 04/01/18 0956 04/01/18 1138 04/01/18 1408  WBC 22.8*  --   --   CREATININE 0.65  --   --   LATICACIDVEN  --  2.4* 1.8    Estimated Creatinine Clearance: 65.2 mL/min (by C-G formula based on SCr of 0.65 mg/dL).    Allergies  Allergen Reactions  . Cefazolin Rash and Shortness Of Breath  . Carvedilol Other (See Comments)    fatigue  . Chlorthalidone Other (See Comments)    Other reaction(s): Unknown  . Hydralazine     Other reaction(s): Unknown  . Hydrochlorothiazide Other (See Comments)  . Lortab [Hydrocodone-Acetaminophen]     Vivid dreaming    Antimicrobials this admission: Vancomycin 6/1 >> Levofloxacin 6/1 >> Aztreonam 6/1 >>  Dose adjustments this admission:   Microbiology results: BCx: 6/1 >> sent    Thank you for allowing pharmacy to be a part of this patient's care.  Lendon Ka, PharmD Pharmacy Resident 04/01/2018 3:23 PM

## 2018-04-01 NOTE — ED Notes (Signed)
Pt taken to CT at this time.

## 2018-04-01 NOTE — ED Notes (Signed)
Patient transported to 105

## 2018-04-02 DIAGNOSIS — F17210 Nicotine dependence, cigarettes, uncomplicated: Secondary | ICD-10-CM

## 2018-04-02 DIAGNOSIS — R630 Anorexia: Secondary | ICD-10-CM

## 2018-04-02 DIAGNOSIS — C786 Secondary malignant neoplasm of retroperitoneum and peritoneum: Secondary | ICD-10-CM

## 2018-04-02 DIAGNOSIS — G893 Neoplasm related pain (acute) (chronic): Secondary | ICD-10-CM

## 2018-04-02 DIAGNOSIS — I10 Essential (primary) hypertension: Secondary | ICD-10-CM

## 2018-04-02 DIAGNOSIS — C679 Malignant neoplasm of bladder, unspecified: Secondary | ICD-10-CM

## 2018-04-02 DIAGNOSIS — Z79899 Other long term (current) drug therapy: Secondary | ICD-10-CM

## 2018-04-02 DIAGNOSIS — Z809 Family history of malignant neoplasm, unspecified: Secondary | ICD-10-CM

## 2018-04-02 DIAGNOSIS — I739 Peripheral vascular disease, unspecified: Secondary | ICD-10-CM

## 2018-04-02 LAB — BASIC METABOLIC PANEL
ANION GAP: 6 (ref 5–15)
BUN: 10 mg/dL (ref 6–20)
CHLORIDE: 94 mmol/L — AB (ref 101–111)
CO2: 24 mmol/L (ref 22–32)
CREATININE: 0.51 mg/dL — AB (ref 0.61–1.24)
Calcium: 6.9 mg/dL — ABNORMAL LOW (ref 8.9–10.3)
GFR calc Af Amer: >60 mL/min (ref >60)
GFR calc non Af Amer: >60 mL/min (ref >60)
Glucose, Bld: 92 mg/dL (ref 65–99)
Potassium: 3.3 mmol/L — ABNORMAL LOW (ref 3.5–5.1)
SODIUM: 124 mmol/L — AB (ref 135–145)

## 2018-04-02 LAB — CBC
HCT: 25.9 % — ABNORMAL LOW (ref 40.0–52.0)
HEMOGLOBIN: 8.6 g/dL — AB (ref 13.0–18.0)
MCH: 30.6 pg (ref 26.0–34.0)
MCHC: 33.2 g/dL (ref 32.0–36.0)
MCV: 92.3 fL (ref 80.0–100.0)
PLATELETS: 197 10*3/uL (ref 150–440)
RBC: 2.8 MIL/uL — ABNORMAL LOW (ref 4.40–5.90)
RDW: 20.2 % — ABNORMAL HIGH (ref 11.5–14.5)
WBC: 16.6 10*3/uL — AB (ref 3.8–10.6)

## 2018-04-02 MED ORDER — SCOPOLAMINE 1 MG/3DAYS TD PT72
1.0000 | MEDICATED_PATCH | TRANSDERMAL | Status: DC
  Administered 2018-04-02: 19:00:00 1.5 mg via TRANSDERMAL
  Filled 2018-04-02: qty 1

## 2018-04-02 MED ORDER — METRONIDAZOLE IN NACL 5-0.79 MG/ML-% IV SOLN
500.0000 mg | Freq: Three times a day (TID) | INTRAVENOUS | Status: DC
  Administered 2018-04-02 – 2018-04-03 (×3): 500 mg via INTRAVENOUS
  Filled 2018-04-02 (×7): qty 100

## 2018-04-02 MED ORDER — FAMOTIDINE IN NACL 20-0.9 MG/50ML-% IV SOLN
20.0000 mg | INTRAVENOUS | Status: DC
  Administered 2018-04-02: 20 mg via INTRAVENOUS
  Filled 2018-04-02: qty 50

## 2018-04-02 MED ORDER — LACTULOSE 10 GM/15ML PO SOLN
30.0000 g | Freq: Every day | ORAL | Status: DC
  Administered 2018-04-02 – 2018-04-03 (×2): 30 g via ORAL
  Filled 2018-04-02 (×3): qty 60

## 2018-04-02 MED ORDER — ONDANSETRON HCL 4 MG/2ML IJ SOLN
4.0000 mg | Freq: Four times a day (QID) | INTRAMUSCULAR | Status: DC | PRN
  Administered 2018-04-02: 4 mg via INTRAVENOUS
  Filled 2018-04-02 (×2): qty 2

## 2018-04-02 MED ORDER — ENOXAPARIN SODIUM 30 MG/0.3ML ~~LOC~~ SOLN
30.0000 mg | SUBCUTANEOUS | Status: DC
  Administered 2018-04-02: 30 mg via SUBCUTANEOUS
  Filled 2018-04-02: qty 0.3

## 2018-04-02 MED ORDER — HYDROMORPHONE HCL 1 MG/ML IJ SOLN
1.0000 mg | INTRAMUSCULAR | Status: DC | PRN
  Administered 2018-04-02: 1 mg via INTRAVENOUS
  Filled 2018-04-02: qty 1

## 2018-04-02 NOTE — Plan of Care (Signed)
  Problem: Clinical Measurements: Goal: Ability to maintain clinical measurements within normal limits will improve Outcome: Progressing Goal: Will remain free from infection Outcome: Progressing   

## 2018-04-02 NOTE — Progress Notes (Addendum)
[facility] NAME: Patrick Hooper    MR#:  [id number]  DATE OF BIRTH:  Dec 10, 1945  SUBJECTIVE: 72 year old male patient with history of follow-up metastatic bladder cancer came in because of abdominal pain, nausea, vomiting for 1 week, patient did not have BM for like 4 days.  Patient admitted for possible intra-abdominal sepsis but CT abdomen showed worsening lymphadenopathy in the upper abdominal mesentery, retroperitoneum, worsening right pleural effusion, diffuse osseous mets.  Patient understands that his cancer is worse and he wants to go home , he wants to talk talk to case manager about hospice.  CHIEF COMPLAINT:   Also requesting, nhow many more months  He can survive I told that Dr. Mike Gip can answer that question.  REVIEW OF SYSTEMS:   ROS CONSTITUTIONAL: No fever, fatigue or weakness.  EYES: No blurred or double vision.  EARS, NOSE, AND THROAT: No tinnitus or ear pain.  RESPIRATORY: No cough, shortness of breath, wheezing or hemoptysis.  CARDIOVASCULAR: No chest pain, orthopnea, edema.  GASTROINTESTINAL: Nausea, vomiting, abdominal pain, chronic body pains  gENITOURINARY: No dysuria, hematuria.  ENDOCRINE: No polyuria, nocturia,  HEMATOLOGY: No anemia, easy bruising or bleeding SKIN: No rash or lesion. MUSCULOSKELETAL: No joint pain or arthritis.   NEUROLOGIC: No tingling, numbness, weakness.  PSYCHIATRY: No anxiety or depression.   DRUG ALLERGIES:   Allergies  Allergen Reactions  . Cefazolin Rash and Shortness Of Breath  . Carvedilol Other (See Comments)    fatigue  . Chlorthalidone Other (See Comments)    Other reaction(s): Unknown  . Hydralazine     Other reaction(s): Unknown  . Hydrochlorothiazide Other (See Comments)  . Lortab [Hydrocodone-Acetaminophen]     Vivid dreaming    VITALS:  Blood pressure 118/66, pulse 99, temperature 98.6 F (37 C), temperature source Oral, resp. rate 16, height 5\' 11"   (1.803 m), weight 54.7 kg (120 lb 8 oz), SpO2 90 %.  PHYSICAL EXAMINATION:  GENERAL:  72 y.o.-year-old patient lying in the bed with no acute distress.  Ill-appearing. EYES: Pupils equal, round, reactive to light  accommodation. No scleral icterus. Extraocular muscles intact.  HEENT: Head atraumatic, normocephalic. Oropharynx and nasopharynx clear.  NECK:  Supple, no jugular venous distention. No thyroid enlargement, no tenderness.  LUNGS: Normal breath sounds bilaterally, no wheezing, rales,rhonchi or crepitation. No use of accessory muscles of respiration.  CARDIOVASCULAR: S1, S2 normal. No murmurs, rubs, or gallops.  ABDOMEN: Slight tenderness in  midepigastric area.  Bowel sounds diminished. EXTREMITIES: No pedal edema, cyanosis, or clubbing.  NEUROLOGIC: Cranial nerves II through XII are intact. Muscle strength 5/5 in all extremities. Sensation intact. Gait not checked.  PSYCHIATRIC: The patient is alert and oriented x 3.  SKIN: No obvious rash, lesion, or ulcer.    LABORATORY PANEL:   CBC Recent Labs  Lab 04/02/18 0408  WBC 16.6*  HGB 8.6*  HCT 25.9*  PLT 197   ------------------------------------------------------------------------------------------------------------------  Chemistries  Recent Labs  Lab 04/01/18 0956 04/02/18 0408  NA 126* 124*  K 4.0 3.3*  CL 90* 94*  CO2 22 24  GLUCOSE 96 92  BUN 12 10  CREATININE 0.65 0.51*  CALCIUM 8.3* 6.9*  AST 33  --   ALT 15*  --   ALKPHOS 106  --   BILITOT 0.8  --    ------------------------------------------------------------------------------------------------------------------  Cardiac Enzymes No results for input(s): TROPONINI in the last 168 hours. ------------------------------------------------------------------------------------------------------------------  RADIOLOGY:  Ct Abdomen Pelvis W Contrast  Result Date:  04/01/2018 CLINICAL DATA:  Abdominal pain for 4 days with nausea, vomiting and constipation.  History of stage IV bladder cancer currently undergoing therapy with Hematology Oncology. Osseous metastasis, per patient. Additional history of RIGHT-sided lung cancer in 2016 with partial upper lobectomy. EXAM: CT ABDOMEN AND PELVIS WITH CONTRAST TECHNIQUE: Multidetector CT imaging of the abdomen and pelvis was performed using the standard protocol following bolus administration of intravenous contrast. CONTRAST:  95mL OMNIPAQUE IOHEXOL 300 MG/ML  SOLN COMPARISON:  CT abdomen dated 11/28/2017.  PET-CT dated 01/23/2018. FINDINGS: Lower chest: New pleural effusion at the RIGHT lung base, at least moderate in size, incompletely imaged, with associated compressive atelectasis. LEFT lung base is clear. Hepatobiliary: No focal liver abnormality is seen. No gallstones, gallbladder wall thickening, or biliary dilatation. Pancreas: Unremarkable. No pancreatic ductal dilatation or surrounding inflammatory changes. Spleen: Normal in size without focal abnormality. Adrenals/Urinary Tract: Adrenal glands are unremarkable. Kidneys are normal, without renal calculi, focal lesion, or hydronephrosis. Stomach/Bowel: No dilated large or small bowel loops. Stomach is unremarkable, partially decompressed. Questionable circumferential thickening of the walls of the gastric antrum, possibly artifactual due to the gastric decompression. Vascular/Lymphatic: New conglomerate lymphadenopathy within the upper abdominal mesentery, just below the level of the pancreas, new compared to the earlier head CT of 01/23/2018 indicating progression of disease. Additional new lymphadenopathy within the aortocaval and LEFT periaortic retroperitoneum, also indicating progression of disease. Aortic atherosclerosis. Surgical changes of previous bilateral aortofemoral bypass. Reproductive: Increased masslike density at the bladder base, now measuring approximately 5 cm greatest dimension, compatible with the given history of bladder cancer. Other: Small  amount of free fluid in the pelvis and bilateral pericolic gutters. No circumscribed abscess collection or free intraperitoneal air. Musculoskeletal: Diffuse osseous metastases throughout the thoracolumbar spine and osseous pelvis. IMPRESSION: 1. New conglomerate lymphadenopathy within the upper abdominal mesentery, just below the level of the pancreas, and within the retroperitoneum, indicating progression of disease compared to the PET-CT of 01/23/2018. 2. New RIGHT pleural effusion, at least moderate in size, incompletely imaged, with associated compressive atelectasis. 3. Diffuse osseous metastases, as previously described. 4. Increased masslike thickening at the bladder base, compatible with the given history of bladder cancer. 5. Questionable thickening of the walls of the gastric antrum, more likely artifactual due to the gastric decompression, infectious gastritis or neoplastic wall thickening considered less likely. No bowel obstruction. 6. Small amount of free fluid in the abdomen and pelvis. No abscess collection. 7. Aortic atherosclerosis. Electronically Signed   By: Franki Cabot M.D.   On: 04/01/2018 12:34    EKG:   Orders placed or performed during the hospital encounter of 04/01/18  . ED EKG 12-Lead  . ED EKG 12-Lead    ASSESSMENT AND PLAN:   72 year old male patient with history of metastatic bladder cancer came in because of nausea, vomiting, abdominal pain and Constipation  #1 possible intra-abdominal sepsis: With elevated white count, lactic acid: Patient is on broad-spectrum antibiotics with Azactam, Levaquin, IV fluids. 2.  Constipation secondary to worsening abdominal lymphadenopathy, narcotic use: Continue lactulose. 3.  Metastatic bladder cancer status post chemotherapy, radiation, patient told me that he does not want chemotherapy anymore and he knows his cancer is worsening and he is tired of living like this and he wants to go home with hospice and have good time with  family members, patient told me that he has multiple family members visiting from [place], [place], he just want to go home and have time with family members.  Continue narcotics for  pain control patient has all she has meds, intramuscular soft tissue mets,, retroperitoneal lymphadenopathy  #4 .hyponatremia: Chronic.  Continue IV hydration, Prognosis really poor, CODE STATUS DNR, but recommend oncology consult, likely discharge tomorrow home because patient told me that he is supposed to meet with physical therapy and have hospice also at home yesterday but he ended up in the hospital.    All the records are reviewed and case discussed with Care Management/Social Workerr. Management plans discussed with the patient, family and they are in agreement.  CODE STATUS: DNR TOTAL TIME TAKING CARE OF THIS PATIENT: 40 minutes.   POSSIBLE D/C IN 1-2 DAYS, DEPENDING ON CLINICAL CONDITION. Than 50% of time spent in counseling, coordination of the care  Epifanio Lesches M.D on 04/02/2018 at 8:43 AM  Between 7am to 6pm - Pager - [phone]  After 6pm go to [email or web address] - password [email or web address] [facility]  Office  [phone]  CC: Primary care physician; Elby Beck, FNP   Note: This dictation was prepared with Dragon dictation along with smaller phrase technology. Any transcriptional errors that result from this process are unintentional.

## 2018-04-02 NOTE — Progress Notes (Signed)
Anticoagulation monitoring(Lovenox):  72yo  M ordered Lovenox 40 mg Q24h  Filed Weights   04/01/18 0947 04/01/18 1611  Weight: 120 lb (54.4 kg) 120 lb 8 oz (54.7 kg)   BMI 16.81   Lab Results  Component Value Date   CREATININE 0.51 (L) 04/02/2018   CREATININE 0.65 04/01/2018   CREATININE 0.54 (L) 03/17/2018   Estimated Creatinine Clearance: 65.5 mL/min (A) (by C-G formula based on SCr of 0.51 mg/dL (L)). Hemoglobin & Hematocrit     Component Value Date/Time   HGB 8.6 (L) 04/02/2018 0408   HCT 25.9 (L) 04/02/2018 0408     Per Protocol for Patient with estCrcl > 30 ml/min and weight < 57 kg in male patient,  will transition to Lovenox 30 mg Q24h      Chinita Greenland PharmD Clinical Pharmacist 04/02/2018

## 2018-04-02 NOTE — Care Management Note (Addendum)
Case Management Note  Patient Details  Name: Patrick Hooper MRN: [id number] Date of Birth: Jan 15, 1946  Subjective/Objective: Admitted to [facility] with the diagnosis of sepsis. Lives with wife, [name]. Sister is [name] [phone]). [name] FNP for primary care. Goes to the [facility] for bladder cancer. Chemotherapy in the past. Goes to [facility]. Does not want to transfer. Prescriptions are filled at [facility].  Followed by [facility]. No skilled facility. No home oxygen. Rolling Walker and shower chair in the home. Decreased appetite.                   Action/Plan: Does not want to transfer to [facility]. Refusal of Transfer to [facility] signed. Will fax out. Wants to transition from [facility] to [facility] Update called to [facility] representative. States she will let the office know   Expected Discharge Date:                  Expected Discharge Plan:     In-House Referral:   yes  Discharge planning Services   yes  Post Acute Care Choice:   yes Choice offered to:   [name]  DME Arranged:    DME Agency:     HH Arranged:   yes HH Agency:   [facility]  Status of Service:     If discussed at [place] of Stay Meetings, dates discussed:    Additional Comments:  Shelbie Ammons, RN MSN CCM Care Management [phone] 04/02/2018, 10:51 AM

## 2018-04-02 NOTE — Progress Notes (Signed)
Pt actively vomiting @ approximately 1630. MD notified. Order for Q6H PRN IV zofran given. Pt also c/o stomach feeling like "it's on fire". MD ordered 20mg  Pepcid IV daily. These medications were given by this nurse. Pt symptoms have subsided.   Pt c/o stomach pain 7/10 and mouth feeling thick with secretions. MD notified. Q6H 1-2mg  IV Dilaudid ordered, as well as a scopolamine patch. Pain medication given. Pt pain has decreased. Will continue to monitor.

## 2018-04-02 NOTE — Consult Note (Signed)
[place] CONSULT NOTE  Patient Care Team: Elby Beck, FNP as PCP - General (Nurse Practitioner)  CHIEF COMPLAINTS/PURPOSE OF CONSULTATION:  Metastatic bladder cancer  HISTORY OF PRESENTING ILLNESS:  Patrick Hooper 72 y.o.  male with a history of metastatic bladder cancer; remote history of lung cancer; peripheral vascular disease/COPD active smoker currently admitted the hospital for worsening abdominal pain/poor p.o. Intake.  Patient was diagnosed with bladder cancer sometime in October November 2018-patient has been on first-line chemotherapy with carboplatin and gemcitabine with partial response.  More recently patient noted to have progressive generalized weakness; poor p.o. Intake; positive for weight loss.  Patient also complains of worsening abdominal pain; also opioid induced constipation.  Patient also complains of pain " all over"-and is on p.o. narcotic pain medication.  On admission to the hospital patient had a CT scan of the abdomen pelvis that showed-increasing peritoneal metastases; worsening retroperitoneal adenopathy; increasing bladder tumor mass; also stable bone mets.  Patient incidentally noted to have a moderate-sized right-sided pleural effusion.  Patient's abdominal pain is currently well controlled.   In general patient feels chemotherapy is not helping him anymore; he feels his quality of life is quite compromised on current therapy.  And he is reluctant to proceed with any further treatment at this time  ROS: Positive for shortness of breath.  Positive for cough.  No hemoptysis.  A complete 10 point review of system is done which is negative except mentioned above in history of present illness  MEDICAL HISTORY:  Past Medical History:  Diagnosis Date  . Bladder cancer ([place])   . Hypertension   . Lung cancer ([place]) 2016  . PVD (peripheral vascular disease) ([place])     SURGICAL HISTORY: Past Surgical History:  Procedure Laterality  Date  . BLADDER SURGERY  08/12/2017  . BYPASS GRAFT FEMORAL/POPLITEAL W/VEIN  2009  . COLONOSCOPY  2013  . CYSTOSCOPY  08/12/2017  . IR ABDOMINAL AORTOBIFEMORAL CATHETER SERIALOGRAM  2009  . LOBECTOMY Right 09/2015  . PACEMAKER INSERTION  2003  . PORTA CATH INSERTION N/A 11/21/2017   Procedure: PORTA CATH INSERTION;  Surgeon: Algernon Huxley, MD;  Location: [facility];  Service: Cardiovascular;  Laterality: N/A;    SOCIAL HISTORY: Social History   Socioeconomic History  . Marital status: Significant Other    Spouse name: Not on file  . Number of children: Not on file  . Years of education: Not on file  . Highest education level: Not on file  Occupational History  . Occupation: retired  Scientific laboratory technician  . Financial resource strain: Not hard at all  . Food insecurity:    Worry: Never true    Inability: Never true  . Transportation needs:    Medical: No    Non-medical: No  Tobacco Use  . Smoking status: Current Every Day Smoker    Packs/day: 1.00    Years: 55.00    Pack years: 55.00    Types: Cigarettes  . Smokeless tobacco: Never Used  Substance and Sexual Activity  . Alcohol use: Yes    Comment: Occasional beer  . Drug use: No  . Sexual activity: Not Currently  Lifestyle  . Physical activity:    Days per week: 0 days    Minutes per session: 0 min  . Stress: Only a little  Relationships  . Social connections:    Talks on phone: Patient refused    Gets together: Patient refused    Attends religious service: Patient  refused    Active member of club or organization: Patient refused    Attends meetings of clubs or organizations: Patient refused    Relationship status: Patient refused  . Intimate partner violence:    Fear of current or ex partner: Patient refused    Emotionally abused: Patient refused    Physically abused: Patient refused    Forced sexual activity: Patient refused  Other Topics Concern  . Not on file  Social History Narrative  . Not on file     FAMILY HISTORY: Family History  Problem Relation Age of Onset  . Cancer Mother   . Cancer Sister   . Cancer Maternal Grandmother     ALLERGIES:  is allergic to cefazolin; carvedilol; chlorthalidone; hydralazine; hydrochlorothiazide; and lortab [hydrocodone-acetaminophen].  MEDICATIONS:  Current Facility-Administered Medications  Medication Dose Route Frequency Provider Last Rate Last Dose  . 0.9 %  sodium chloride infusion   Intravenous Continuous Saundra Shelling, MD 75 mL/hr at 04/02/18 0915    . acetaminophen (TYLENOL) tablet 650 mg  650 mg Oral Q6H PRN Saundra Shelling, MD       Or  . acetaminophen (TYLENOL) suppository 650 mg  650 mg Rectal Q6H PRN Saundra Shelling, MD      . ALPRAZolam Duanne Moron) tablet 0.25 mg  0.25 mg Oral BID PRN Saundra Shelling, MD   0.25 mg at 04/01/18 2133  . aztreonam (AZACTAM) 2 g in sodium chloride 0.9 % 100 mL IVPB  2 g Intravenous Q8H Lifsey, Betti Cruz, RPH   Stopped at 04/02/18 0537  . cholecalciferol (VITAMIN D) tablet 800 Units  800 Units Oral Daily Saundra Shelling, MD   800 Units at 04/01/18 1800  . clopidogrel (PLAVIX) tablet 75 mg  75 mg Oral QHS Saundra Shelling, MD   75 mg at 04/01/18 2133  . docusate sodium (COLACE) capsule 100 mg  100 mg Oral BID PRN Pyreddy, Reatha Harps, MD      . enoxaparin (LOVENOX) injection 40 mg  40 mg Subcutaneous Q24H Pyreddy, Reatha Harps, MD   40 mg at 04/01/18 1652  . lactulose (CHRONULAC) 10 GM/15ML solution 30 g  30 g Oral Daily Epifanio Lesches, MD      . levofloxacin [facility]) IVPB 750 mg  750 mg Intravenous q1800 Anette Riedel, Angelica at 04/01/18 2104  . megestrol (MEGACE) 40 MG/ML suspension 200 mg  200 mg Oral Daily PRN Pyreddy, Reatha Harps, MD      . morphine (MS CONTIN) 12 hr tablet 15 mg  15 mg Oral Q12H Pyreddy, Reatha Harps, MD   15 mg at 04/01/18 2133  . nicotine (NICODERM CQ - dosed in mg/24 hours) patch 21 mg  21 mg Transdermal Daily Pyreddy, Pavan, MD   21 mg at 04/01/18 1800  . ondansetron (ZOFRAN) tablet 4 mg  4 mg  Oral Q6H PRN Pyreddy, Reatha Harps, MD      . oxyCODONE (Oxy IR/ROXICODONE) immediate release tablet 5 mg  5 mg Oral Q4H PRN Saundra Shelling, MD   5 mg at 04/01/18 1653  . potassium chloride SA (K-DUR,KLOR-CON) CR tablet 20 mEq  20 mEq Oral Daily Pyreddy, Reatha Harps, MD   20 mEq at 04/01/18 1653  . protein supplement (PREMIER PROTEIN) liquid  11 oz Oral BID BM Pyreddy, Pavan, MD      . vancomycin (VANCOCIN) IVPB 750 mg/150 ml premix  750 mg Intravenous Q12H Anette Riedel, RPH   Stopped at 04/01/18 2325      .  PHYSICAL EXAMINATION:  Vitals:  04/02/18 0407 04/02/18 0627  BP: 118/66   Pulse: 99   Resp: 16   Temp: (!) 97.5 F (36.4 C) 98.6 F (37 C)  SpO2: 99% 90%   Filed Weights   04/01/18 0947 04/01/18 1611  Weight: 120 lb (54.4 kg) 120 lb 8 oz (54.7 kg)    GENERAL: Cachectic appearing frail Caucasian male patient alert, no distress and comfortable.   Alone. EYES: no pallor or icterus OROPHARYNX: no thrush or ulceration. NECK: supple, no masses felt LYMPH:  no palpable lymphadenopathy in the cervical, axillary or inguinal regions LUNGS: decreased breath sounds to auscultation at right compared to left. No wheeze or crackles HEART/CVS: regular rate & rhythm and no murmurs; No lower extremity edema ABDOMEN: abdomen soft, non-tender and normal bowel sounds Musculoskeletal:no cyanosis of digits and no clubbing  PSYCH: alert & oriented x 3 with fluent speech NEURO: no focal motor/sensory deficits SKIN:  no rashes or significant lesions  LABORATORY DATA:  I have reviewed the data as listed Lab Results  Component Value Date   WBC 16.6 (H) 04/02/2018   HGB 8.6 (L) 04/02/2018   HCT 25.9 (L) 04/02/2018   MCV 92.3 04/02/2018   PLT 197 04/02/2018   Recent Labs    03/07/18 1034  03/16/18 1641 03/17/18 1036 04/01/18 0956 04/02/18 0408  NA 129*   < > 124* 126* 126* 124*  K 3.7   < > 3.8 3.8 4.0 3.3*  CL 98*   < > 93* 101 90* 94*  CO2 21*   < > 20* 19* 22 24  GLUCOSE 99   < > 129*  97 96 92  BUN 10   < > 11 7 12 10   CREATININE 0.67   < > 0.83 0.54* 0.65 0.51*  CALCIUM 8.4*   < > 8.3* 7.4* 8.3* 6.9*  GFRNONAA >60   < > >60 >60 >60 >60  GFRAA >60   < > >60 >60 >60 >60  PROT 6.6  --  7.1  --  7.1  --   ALBUMIN 3.0*  --  3.1*  --  2.6*  --   AST 19  --  26  --  33  --   ALT 10*  --  14*  --  15*  --   ALKPHOS 116  --  123  --  106  --   BILITOT 0.4  --  0.3  --  0.8  --    < > = values in this interval not displayed.    RADIOGRAPHIC STUDIES: I have personally reviewed the radiological images as listed and agreed with the findings in the report. Dg Chest 2 View  Result Date: 03/16/2018 CLINICAL DATA:  Weakness. EXAM: CHEST - 2 VIEW COMPARISON:  October 12, 2017 FINDINGS: Stable pacemaker. No pneumothorax. A new right Port-A-Cath terminates in good position. Elevation of the right hemidiaphragm is again identified. There may be a tiny right effusion. Cardiomediastinal silhouette is stable. No focal infiltrate. Linear density in the lateral right lung is likely scar atelectasis. No other acute abnormalities. IMPRESSION: Possible tiny right pleural effusion.  No other acute abnormalities. Electronically Signed   By: Dorise Bullion III M.D   On: 03/16/2018 21:03   Ct Abdomen Pelvis W Contrast  Result Date: 04/01/2018 CLINICAL DATA:  Abdominal pain for 4 days with nausea, vomiting and constipation. History of stage IV bladder cancer currently undergoing therapy with Hematology Oncology. Osseous metastasis, per patient. Additional history of RIGHT-sided lung cancer in 2016 with  partial upper lobectomy. EXAM: CT ABDOMEN AND PELVIS WITH CONTRAST TECHNIQUE: Multidetector CT imaging of the abdomen and pelvis was performed using the standard protocol following bolus administration of intravenous contrast. CONTRAST:  68mL OMNIPAQUE IOHEXOL 300 MG/ML  SOLN COMPARISON:  CT abdomen dated 11/28/2017.  PET-CT dated 01/23/2018. FINDINGS: Lower chest: New pleural effusion at the RIGHT lung  base, at least moderate in size, incompletely imaged, with associated compressive atelectasis. LEFT lung base is clear. Hepatobiliary: No focal liver abnormality is seen. No gallstones, gallbladder wall thickening, or biliary dilatation. Pancreas: Unremarkable. No pancreatic ductal dilatation or surrounding inflammatory changes. Spleen: Normal in size without focal abnormality. Adrenals/Urinary Tract: Adrenal glands are unremarkable. Kidneys are normal, without renal calculi, focal lesion, or hydronephrosis. Stomach/Bowel: No dilated large or small bowel loops. Stomach is unremarkable, partially decompressed. Questionable circumferential thickening of the walls of the gastric antrum, possibly artifactual due to the gastric decompression. Vascular/Lymphatic: New conglomerate lymphadenopathy within the upper abdominal mesentery, just below the level of the pancreas, new compared to the earlier head CT of 01/23/2018 indicating progression of disease. Additional new lymphadenopathy within the aortocaval and LEFT periaortic retroperitoneum, also indicating progression of disease. Aortic atherosclerosis. Surgical changes of previous bilateral aortofemoral bypass. Reproductive: Increased masslike density at the bladder base, now measuring approximately 5 cm greatest dimension, compatible with the given history of bladder cancer. Other: Small amount of free fluid in the pelvis and bilateral pericolic gutters. No circumscribed abscess collection or free intraperitoneal air. Musculoskeletal: Diffuse osseous metastases throughout the thoracolumbar spine and osseous pelvis. IMPRESSION: 1. New conglomerate lymphadenopathy within the upper abdominal mesentery, just below the level of the pancreas, and within the retroperitoneum, indicating progression of disease compared to the PET-CT of 01/23/2018. 2. New RIGHT pleural effusion, at least moderate in size, incompletely imaged, with associated compressive atelectasis. 3. Diffuse  osseous metastases, as previously described. 4. Increased masslike thickening at the bladder base, compatible with the given history of bladder cancer. 5. Questionable thickening of the walls of the gastric antrum, more likely artifactual due to the gastric decompression, infectious gastritis or neoplastic wall thickening considered less likely. No bowel obstruction. 6. Small amount of free fluid in the abdomen and pelvis. No abscess collection. 7. Aortic atherosclerosis. Electronically Signed   By: Franki Cabot M.D.   On: 04/01/2018 12:34    ASSESSMENT & PLAN:   #72 year old male patient with a history of metastatic bladder cancer currently admitted to hospital for worsening abdominal pain/back pain  # Metastatic bladder cancer-currently on gemcitabine carboplatin chemotherapy status post cycle #6.  CT scan-at this admission shows progressive disease.  I reviewed with the patient the subsequent lines of therapy include immunotherapy-which unfortunately has limited response rates of about 20% or so.  Further options could be considered based on further molecular testing etc.  Patient not too keen on continuing further therapy at this time; given his significant creatinine his quality of life.  He is looking forward to pursue hospice at this time.  I have told the patient that this is reasonable; however I would reach out to his primary oncologist Dr. Mike Gip regarding her thoughts.  He is agreeable.  #Question sepsis-infectious work-up is in progress; on antibiotics.  Await cultures.  #Pain second malignancy-continue pain medication; currently better controlled.  #Prognosis/CODE STATUS-DNR/DNI; overall prognosis is poor; I reviewed at length the median survival of metastatic bladder cancer is anywhere between 9 to 12 months.  Without further treatment survival is survival would be measured in few months.  Await palliative  care evaluation.  Discussed with Education officer, museum.  #Diet-patient interested in  regular diet given his overall poor prognosis; which I think is reasonable.  Thank you Dr. Vianne Bulls for allowing me to participate in the care of your pleasant patient. Please do not hesitate to contact me with questions or concerns in the interim.  All questions were answered. The patient knows to call the clinic with any problems, questions or concerns.     Cammie Sickle, MD 04/02/2018 10:27 AM

## 2018-04-02 NOTE — Plan of Care (Signed)
  Problem: Education: Goal: Knowledge of General Education information will improve Outcome: Progressing   Problem: Clinical Measurements: Goal: Ability to maintain clinical measurements within normal limits will improve Outcome: Progressing   Problem: Education: Goal: Knowledge of General Education information will improve Outcome: Progressing   Problem: Clinical Measurements: Goal: Ability to maintain clinical measurements within normal limits will improve Outcome: Progressing Goal: Will remain free from infection Outcome: Progressing   Problem: Coping: Goal: Level of anxiety will decrease Outcome: Progressing

## 2018-04-03 ENCOUNTER — Inpatient Hospital Stay: Payer: Non-veteran care

## 2018-04-03 ENCOUNTER — Inpatient Hospital Stay: Payer: Non-veteran care | Admitting: Urgent Care

## 2018-04-03 LAB — BASIC METABOLIC PANEL
ANION GAP: 7 (ref 5–15)
BUN: 8 mg/dL (ref 6–20)
CHLORIDE: 99 mmol/L — AB (ref 101–111)
CO2: 21 mmol/L — ABNORMAL LOW (ref 22–32)
Calcium: 6.7 mg/dL — ABNORMAL LOW (ref 8.9–10.3)
Creatinine, Ser: 0.44 mg/dL — ABNORMAL LOW (ref 0.61–1.24)
GFR calc Af Amer: >60 mL/min (ref >60)
GFR calc non Af Amer: >60 mL/min (ref >60)
GLUCOSE: 87 mg/dL (ref 65–99)
POTASSIUM: 3.4 mmol/L — AB (ref 3.5–5.1)
Sodium: 127 mmol/L — ABNORMAL LOW (ref 135–145)

## 2018-04-03 LAB — CBC
HEMATOCRIT: 24.2 % — AB (ref 40.0–52.0)
HEMOGLOBIN: 8.1 g/dL — AB (ref 13.0–18.0)
MCH: 30.7 pg (ref 26.0–34.0)
MCHC: 33.5 g/dL (ref 32.0–36.0)
MCV: 91.5 fL (ref 80.0–100.0)
Platelets: 205 10*3/uL (ref 150–440)
RBC: 2.64 MIL/uL — ABNORMAL LOW (ref 4.40–5.90)
RDW: 20 % — ABNORMAL HIGH (ref 11.5–14.5)
WBC: 17.1 10*3/uL — AB (ref 3.8–10.6)

## 2018-04-03 MED ORDER — HYDROMORPHONE HCL 2 MG PO TABS: 1.0000 mg | ORAL_TABLET | ORAL | 0 refills | Status: AC | PRN

## 2018-04-03 MED ORDER — METRONIDAZOLE 500 MG PO TABS
500.0000 mg | ORAL_TABLET | Freq: Three times a day (TID) | ORAL | 0 refills | Status: AC
Start: 2018-04-03 — End: 2018-04-10

## 2018-04-03 MED ORDER — HEPARIN SOD (PORK) LOCK FLUSH 100 UNIT/ML IV SOLN
500.0000 [IU] | Freq: Once | INTRAVENOUS | Status: DC
  Filled 2018-04-03: qty 5

## 2018-04-03 MED ORDER — LEVOFLOXACIN 500 MG PO TABS: 500.0000 mg | ORAL_TABLET | Freq: Every day | ORAL | 0 refills | Status: AC

## 2018-04-03 NOTE — Care Management Note (Signed)
Case Management Note  Patient Details  Name: Osker Ayoub MRN: [id number] Date of Birth: 03-22-1946  Subjective/Objective:   Spoke with patient and his wife. They have spoken with [facility] hospice and have a hsopital bed on the way and also have all the hospice services arranged for home. No other needs noted. Patient will discharge home by car.                 Action/Plan:   Expected Discharge Date:  04/03/18               Expected Discharge Plan:  Home w Hospice Care  In-House Referral:     Discharge planning Services  CM Consult  Post Acute Care Choice:  Hospice Choice offered to:  Patient, Spouse  DME Arranged:    DME Agency:     HH Arranged:  Disease Management [place] Agency:  [facility])  Status of Service:  Completed, signed off  If discussed at [place] of Stay Meetings, dates discussed:    Additional Comments:  Jolly Mango, RN 04/03/2018, 2:53 PM

## 2018-04-03 NOTE — Progress Notes (Signed)
Pt is being discharged today, all belongings packed and returned to the patient, Port deaccessed and  IVs removed. Pt was dressed and helped into a wheelchair by staff.

## 2018-04-03 NOTE — Discharge Summary (Signed)
Patrick Hooper, is a 72 y.o. male  DOB [birth year]  MRN [id number].  Admission date:  04/01/2018  Admitting Physician  Saundra Shelling, MD  Discharge Date:  04/03/2018   Primary MD  Elby Beck, FNP  Recommendations for primary care physician for things to follow:   Discharge home with hospice   Admission Diagnosis  Elevated lactic acid level [R79.89] Abdominal pain, unspecified abdominal location [R10.9] Nausea and vomiting, intractability of vomiting not specified, unspecified vomiting type [R11.2]   Discharge Diagnosis  Elevated lactic acid level [R79.89] Abdominal pain, unspecified abdominal location [R10.9] Nausea and vomiting, intractability of vomiting not specified, unspecified vomiting type [R11.2]    Active Problems:   Sepsis [facility])      Past Medical History:  Diagnosis Date  . Bladder cancer ([place])   . Hypertension   . Lung cancer ([place]) 2016  . PVD (peripheral vascular disease) ([place])     Past Surgical History:  Procedure Laterality Date  . BLADDER SURGERY  08/12/2017  . BYPASS GRAFT FEMORAL/POPLITEAL W/VEIN  2009  . COLONOSCOPY  2013  . CYSTOSCOPY  08/12/2017  . IR ABDOMINAL AORTOBIFEMORAL CATHETER SERIALOGRAM  2009  . LOBECTOMY Right 09/2015  . PACEMAKER INSERTION  2003  . PORTA CATH INSERTION N/A 11/21/2017   Procedure: PORTA CATH INSERTION;  Surgeon: Algernon Huxley, MD;  Location: [facility];  Service: Cardiovascular;  Laterality: N/A;       History of present illness and  Hospital Course:     Kindly see H&P for history of present illness and admission details, please review complete Labs, Consult reports and Test reports for all details in brief  HPI  from the history and physical done on the day of admission 72 year old male patient with metastatic bladder cancer, lung  cancer, COPD, active smoker comes in because of worsening abdominal pain, poor p.o. intake.   Hospital Course   #1 worsening abdominal pain, back pain likely secondary to worsening metastatic bladder cancer, CT abdomen showed progression of the disease.  Patient did not want further chemotherapy and looking forward to pursue hospice at home.  Patient seen by Dr. Rogue Bussing who is on-call oncologist covering for Dr. Mike Gip. #2 .possible intra-abdominal sepsis with elevated lactic acid, patient received Levaquin, Flagyl, admitted to medical floor. Patient lactic acid slightly up at 1.8, WBC 17.1.  Discharging home with Levaquin, Flagyl day patient the course for 7 days.  But his abdominal pain is likely secondary to underlying cancer rather than acute sepsis.  Cultures have been negative. 3.  Weakness, poor p.o. intake.  Continue regular diet as tolerated. Overall prognosis really poor, patient want to spend time with his family members.  CODE STATUS DNR.  [facility] hosp[facility] to follow. \ 4 hyponatremia: Chronic: Received IV fluids.  Encourage hydration, regular diet. Patient is on Megace, nutritional supplements. #5 abdominal pain secondary to worsening cancer: Improved with Dilaudid.  So we cannot give Dilaudid prescription 1 mg p.o. every 4 hours instead of oxycodone. Discharge Condition: Stable   Follow UP      Discharge Instructions  and  Discharge Medications      Allergies as of 04/03/2018      Reactions   Cefazolin Rash, Shortness Of Breath   Carvedilol Other (See Comments)   fatigue   Chlorthalidone Other (See Comments)   Other reaction(s): Unknown   Hydralazine    Other reaction(s): Unknown   Hydrochlorothiazide Other (See Comments)   Lortab [hydrocodone-acetaminophen]    Vivid dreaming  Medication List    TAKE these medications   acetaminophen 500 MG tablet Commonly known as:  TYLENOL Take 500 mg by mouth every 6 (six) hours as needed for mild pain.    ALPRAZolam 0.25 MG tablet Commonly known as:  XANAX Take 1 tablet (0.25 mg total) by mouth 2 (two) times daily as needed for anxiety.   cholecalciferol 400 units Tabs tablet Commonly known as:  VITAMIN D Take 800 Units by mouth daily.   clopidogrel 75 MG tablet Commonly known as:  PLAVIX Take 1 tablet (75 mg total) by mouth at bedtime.   docusate sodium 100 MG capsule Commonly known as:  COLACE Take 100 mg by mouth 2 (two) times daily as needed for mild constipation or moderate constipation.   lactulose 10 GM/15ML solution Commonly known as:  CHRONULAC Take 45 mLs (30 g total) by mouth 2 (two) times daily as needed for mild constipation or severe constipation.   levofloxacin 500 MG tablet Commonly known as:  LEVAQUIN Take 1 tablet (500 mg total) by mouth daily for 10 days.   megestrol 40 MG/ML suspension Commonly known as:  MEGACE ORAL Take 5 mLs (200 mg total) by mouth daily as needed (for appetite).   methocarbamol 500 MG tablet Commonly known as:  ROBAXIN Take 1 tablet (500 mg total) by mouth every 8 (eight) hours as needed for muscle spasms.   metroNIDAZOLE 500 MG tablet Commonly known as:  FLAGYL Take 1 tablet (500 mg total) by mouth 3 (three) times daily for 7 days.   morphine 15 MG 12 hr tablet Commonly known as:  MS CONTIN Take 1 tablet (15 mg total) by mouth every 12 (twelve) hours.   ondansetron 4 MG tablet Commonly known as:  ZOFRAN Take 1 tablet (4 mg total) by mouth every 6 (six) hours as needed for nausea.   oxycodone 5 MG capsule Commonly known as:  OXY-IR Take 1 capsule (5 mg total) by mouth every 4 (four) hours as needed for pain.   polyethylene glycol packet Commonly known as:  MIRALAX / GLYCOLAX Take 17 g by mouth daily. What changed:    when to take this  reasons to take this   potassium chloride SA 20 MEQ tablet Commonly known as:  K-DUR,KLOR-CON Take 1 tablet (20 mEq total) by mouth daily.   protein supplement shake Liqd Commonly  known as:  PREMIER PROTEIN Take 325 mLs (11 oz total) by mouth 2 (two) times daily between meals.         Diet and Activity recommendation: See Discharge Instructions above   Consults obtained -oncology   Major procedures and Radiology Reports - PLEASE review detailed and final reports for all details, in brief -      Dg Chest 2 View  Result Date: 03/16/2018 CLINICAL DATA:  Weakness. EXAM: CHEST - 2 VIEW COMPARISON:  October 12, 2017 FINDINGS: Stable pacemaker. No pneumothorax. A new right Port-A-Cath terminates in good position. Elevation of the right hemidiaphragm is again identified. There may be a tiny right effusion. Cardiomediastinal silhouette is stable. No focal infiltrate. Linear density in the lateral right lung is likely scar atelectasis. No other acute abnormalities. IMPRESSION: Possible tiny right pleural effusion.  No other acute abnormalities. Electronically Signed   By: Dorise Bullion III M.D   On: 03/16/2018 21:03   Ct Abdomen Pelvis W Contrast  Result Date: 04/01/2018 CLINICAL DATA:  Abdominal pain for 4 days with nausea, vomiting and constipation. History of stage IV bladder cancer currently undergoing  therapy with Hematology Oncology. Osseous metastasis, per patient. Additional history of RIGHT-sided lung cancer in 2016 with partial upper lobectomy. EXAM: CT ABDOMEN AND PELVIS WITH CONTRAST TECHNIQUE: Multidetector CT imaging of the abdomen and pelvis was performed using the standard protocol following bolus administration of intravenous contrast. CONTRAST:  75mL OMNIPAQUE IOHEXOL 300 MG/ML  SOLN COMPARISON:  CT abdomen dated 11/28/2017.  PET-CT dated 01/23/2018. FINDINGS: Lower chest: New pleural effusion at the RIGHT lung base, at least moderate in size, incompletely imaged, with associated compressive atelectasis. LEFT lung base is clear. Hepatobiliary: No focal liver abnormality is seen. No gallstones, gallbladder wall thickening, or biliary dilatation. Pancreas:  Unremarkable. No pancreatic ductal dilatation or surrounding inflammatory changes. Spleen: Normal in size without focal abnormality. Adrenals/Urinary Tract: Adrenal glands are unremarkable. Kidneys are normal, without renal calculi, focal lesion, or hydronephrosis. Stomach/Bowel: No dilated large or small bowel loops. Stomach is unremarkable, partially decompressed. Questionable circumferential thickening of the walls of the gastric antrum, possibly artifactual due to the gastric decompression. Vascular/Lymphatic: New conglomerate lymphadenopathy within the upper abdominal mesentery, just below the level of the pancreas, new compared to the earlier head CT of 01/23/2018 indicating progression of disease. Additional new lymphadenopathy within the aortocaval and LEFT periaortic retroperitoneum, also indicating progression of disease. Aortic atherosclerosis. Surgical changes of previous bilateral aortofemoral bypass. Reproductive: Increased masslike density at the bladder base, now measuring approximately 5 cm greatest dimension, compatible with the given history of bladder cancer. Other: Small amount of free fluid in the pelvis and bilateral pericolic gutters. No circumscribed abscess collection or free intraperitoneal air. Musculoskeletal: Diffuse osseous metastases throughout the thoracolumbar spine and osseous pelvis. IMPRESSION: 1. New conglomerate lymphadenopathy within the upper abdominal mesentery, just below the level of the pancreas, and within the retroperitoneum, indicating progression of disease compared to the PET-CT of 01/23/2018. 2. New RIGHT pleural effusion, at least moderate in size, incompletely imaged, with associated compressive atelectasis. 3. Diffuse osseous metastases, as previously described. 4. Increased masslike thickening at the bladder base, compatible with the given history of bladder cancer. 5. Questionable thickening of the walls of the gastric antrum, more likely artifactual due to the  gastric decompression, infectious gastritis or neoplastic wall thickening considered less likely. No bowel obstruction. 6. Small amount of free fluid in the abdomen and pelvis. No abscess collection. 7. Aortic atherosclerosis. Electronically Signed   By: Franki Cabot M.D.   On: 04/01/2018 12:34    Micro Results     Recent Results (from the past 240 hour(s))  Blood culture (routine x 2)     Status: None (Preliminary result)   Collection Time: 04/01/18 11:45 AM  Result Value Ref Range Status   Specimen Description BLOOD R F A  Final   Special Requests   Final    BOTTLES DRAWN AEROBIC AND ANAEROBIC Blood Culture adequate volume   Culture   Final    NO GROWTH < 24 HOURS Performed at [facility], [address] [place], [place] [address]    Report Status PENDING  Incomplete  Blood culture (routine x 2)     Status: None (Preliminary result)   Collection Time: 04/01/18 11:50 AM  Result Value Ref Range Status   Specimen Description BLOOD R AC  Final   Special Requests   Final    BOTTLES DRAWN AEROBIC AND ANAEROBIC Blood Culture adequate volume   Culture   Final    NO GROWTH < 24 HOURS Performed at [facility], [address] [place], [place] [address]  Report Status PENDING  Incomplete       Today   Subjective:   Mykel Sponaugle can forward to go home today. Objective:   Blood pressure (!) 98/54, pulse (!) 110, temperature 98.4 F (36.9 C), temperature source Oral, resp. rate 20, height 5\' 11"  (1.803 m), weight 54.7 kg (120 lb 8 oz), SpO2 92 %.   Intake/Output Summary (Last 24 hours) at 04/03/2018 1357 Last data filed at 04/03/2018 0946 Gross per 24 hour  Intake 2132.5 ml  Output 400 ml  Net 1732.5 ml    Exam Awake Alert, Oriented x 3, No new F.N deficits, Normal affect [place].AT,PERRAL Supple Neck,No JVD, No cervical lymphadenopathy appriciated.  Symmetrical Chest wall movement, Good air movement bilaterally, CTAB RRR,No Gallops,Rubs or new Murmurs,  No Parasternal Heave +ve B.Sounds, Abd Soft, Non tender, No organomegaly appriciated, No rebound -guarding or rigidity. No Cyanosis, Clubbing or edema, No new Rash or bruise  Data Review   CBC w Diff:  Lab Results  Component Value Date   WBC 17.1 (H) 04/03/2018   HGB 8.1 (L) 04/03/2018   HCT 24.2 (L) 04/03/2018   PLT 205 04/03/2018   LYMPHOPCT 5 03/16/2018   BANDSPCT 11 03/16/2018   MONOPCT 17 03/16/2018   EOSPCT 0 03/16/2018   BASOPCT 0 03/16/2018    CMP:  Lab Results  Component Value Date   NA 127 (L) 04/03/2018   K 3.4 (L) 04/03/2018   CL 99 (L) 04/03/2018   CO2 21 (L) 04/03/2018   BUN 8 04/03/2018   CREATININE 0.44 (L) 04/03/2018   PROT 7.1 04/01/2018   ALBUMIN 2.6 (L) 04/01/2018   BILITOT 0.8 04/01/2018   ALKPHOS 106 04/01/2018   AST 33 04/01/2018   ALT 15 (L) 04/01/2018  .   Total Time in preparing paper work, data evaluation and todays exam - 42 minutes  Epifanio Lesches M.D on 04/03/2018 at 1:57 PM    Note: This dictation was prepared with Dragon dictation along with smaller phrase technology. Any transcriptional errors that result from this process are unintentional.

## 2018-04-03 NOTE — Plan of Care (Signed)
  Problem: Pain Managment: Goal: General experience of comfort will improve Outcome: Progressing   Problem: Safety: Goal: Ability to remain free from injury will improve Outcome: Progressing   Problem: Nutrition: Goal: Adequate nutrition will be maintained Outcome: Not Progressing Note:  Pt with poor po intake.    Problem: Skin Integrity: Goal: Risk for impaired skin integrity will decrease Outcome: Not Progressing Note:  Pt with stage II pressure ulcer. Pressure ulcer noticed on admit. Pt does not wish to be turned and repositioned

## 2018-04-04 ENCOUNTER — Ambulatory Visit: Payer: Non-veteran care | Admitting: Hematology and Oncology

## 2018-04-04 ENCOUNTER — Other Ambulatory Visit: Payer: Non-veteran care

## 2018-04-04 ENCOUNTER — Ambulatory Visit: Payer: Medicare Other | Admitting: Hematology and Oncology

## 2018-04-04 ENCOUNTER — Ambulatory Visit: Payer: Non-veteran care

## 2018-04-04 ENCOUNTER — Telehealth: Payer: Self-pay | Admitting: *Deleted

## 2018-04-04 NOTE — Telephone Encounter (Signed)
Per Gaspar Bidding 04/03/18 staff message to schedule: a hospital follow up visit to discuss plan of care going forward.    I called to get patient scheduled for a Hospital F/U as requested.  I spoke with patient's wife Mardene Celeste) and she stated that he will not be coming in the office to be seen anymore. He is now in the care of Hospice. She stated that they are going to lose him sooner than they expected. And that she does not understand why a F/U is needed. As the conversation continued,  I could tell that she was getting very emotional so I told her that I was so sorry and that  I wasn't going to keep her on the line any longer. Message was sent to MD, NP and RN

## 2018-04-06 LAB — CULTURE, BLOOD (ROUTINE X 2)
CULTURE: NO GROWTH
CULTURE: NO GROWTH
Special Requests: ADEQUATE
Special Requests: ADEQUATE

## 2018-04-09 ENCOUNTER — Encounter: Payer: Self-pay | Admitting: Family Medicine

## 2018-04-09 DIAGNOSIS — Z66 Do not resuscitate: Secondary | ICD-10-CM

## 2018-04-09 DIAGNOSIS — Z87891 Personal history of nicotine dependence: Secondary | ICD-10-CM

## 2018-04-09 DIAGNOSIS — Z923 Personal history of irradiation: Secondary | ICD-10-CM

## 2018-04-09 DIAGNOSIS — C672 Malignant neoplasm of lateral wall of bladder: Secondary | ICD-10-CM

## 2018-04-09 DIAGNOSIS — Z7401 Bed confinement status: Secondary | ICD-10-CM

## 2018-04-09 DIAGNOSIS — I10 Essential (primary) hypertension: Secondary | ICD-10-CM

## 2018-04-09 DIAGNOSIS — Z950 Presence of cardiac pacemaker: Secondary | ICD-10-CM

## 2018-04-09 DIAGNOSIS — Z515 Encounter for palliative care: Secondary | ICD-10-CM

## 2018-04-09 DIAGNOSIS — Z9221 Personal history of antineoplastic chemotherapy: Secondary | ICD-10-CM

## 2018-04-09 DIAGNOSIS — C7951 Secondary malignant neoplasm of bone: Secondary | ICD-10-CM

## 2018-04-11 ENCOUNTER — Inpatient Hospital Stay: Payer: Medicare Other | Admitting: Hematology and Oncology

## 2018-04-11 ENCOUNTER — Inpatient Hospital Stay: Payer: Medicare Other

## 2018-04-11 ENCOUNTER — Telehealth: Payer: Self-pay | Admitting: *Deleted

## 2018-04-11 NOTE — Telephone Encounter (Signed)
I called [facility] to confirm that in fact the patient expired on 2018/04/30 Dr Clemmie Krill was his hospice physician.

## 2018-04-11 NOTE — Progress Notes (Deleted)
[facility] day:  04/11/2018   Chief Complaint: Patrick Hooper is a 72 y.o. male with metastatic bladder cancer who is seen for reassessment after interval hospitalization.  HPI:  The patient was last seen in the medical oncology clinic on 005/05/2018.  At that time,  he had a recent episode of diarrhea that was likely of food borne etiology.  Appetite was poor due to acute diarrheal illness. He had lost 2 pounds.  Exam was stable. WBC was 9200 ([place] 6400).  Hemoglobin was 9.9, hematocrit 28.1, and platelets 172,000.  Sodium was129 (low). Potassium was 3.7.   Magnesium was 1.8.  Bone scan revealed no new lesions.  Decision was made to postpone treatment until after a planned trip.  He was admitted 04/01/2018 - 04/03/2018 with abdominal pain, nausea and vomting.    Abdominal and pelvic CT on 04/01/2018 revealed new conglomerate lymphadenopathy within the upper abdominal mesentery, just below the level of the pancreas, and within the retroperitoneum, indicating progression of disease compared to the PET-CT of 01/23/2018.  There was a new moderate RIGHT pleural effusion with associated compressive atelectasis.  There was diffuse osseous metastases.  There was increased masslike thickening at the bladder base.  There was questionable thickening of the walls of the gastric antrum.  There was no bowel obstruction.  There was small amount of free fluid in the abdomen and pelvis. There was no abscess.  He received Levaquin and Flagyl.  He was discharged on Hospice.   Past Medical History:  Diagnosis Date  . Bladder cancer ([place])   . Hypertension   . Lung cancer ([place]) 2016  . PVD (peripheral vascular disease) ([place])     Past Surgical History:  Procedure Laterality Date  . BLADDER SURGERY  08/12/2017  . BYPASS GRAFT FEMORAL/POPLITEAL W/VEIN  2009  . COLONOSCOPY  2013  . CYSTOSCOPY  08/12/2017  . IR ABDOMINAL AORTOBIFEMORAL CATHETER SERIALOGRAM  2009   . LOBECTOMY Right 09/2015  . PACEMAKER INSERTION  2003  . PORTA CATH INSERTION N/A 11/21/2017   Procedure: PORTA CATH INSERTION;  Surgeon: Algernon Huxley, MD;  Location: [facility];  Service: Cardiovascular;  Laterality: N/A;    Family History  Problem Relation Age of Onset  . Cancer Mother   . Cancer Sister   . Cancer Maternal Grandmother     Social History:  reports that he has been smoking cigarettes.  He has a 55.00 pack-year smoking history. He has never used smokeless tobacco. He reports that he drinks alcohol. He reports that he does not use drugs.  He smokes 1 pack a day for > 55 years.  He used to drink 4 alcoholic beverages/day.  Now his alcohol intake is infrequent. 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 There is the potential of past exposure to radiation and/or toxins. He is registered with the [place] regarding his exposures.  He lives in [place].  His girlfriend's cell number is [phone].  The patient is accompanied by his girlfriend Optometrist) today.  Allergies:  Allergies  Allergen Reactions  . Cefazolin Rash and Shortness Of Breath  . Carvedilol Other (See Comments)    fatigue  . Chlorthalidone Other (See Comments)    Other reaction(s): Unknown  . Hydralazine     Other reaction(s): Unknown  . Hydrochlorothiazide Other (See Comments)  . Lortab [Hydrocodone-Acetaminophen]     Vivid dreaming    Current Medications:  Current Outpatient Medications  Medication Sig Dispense Refill  . acetaminophen (TYLENOL) 500 MG tablet Take 500 mg by mouth every 6 (six) hours as needed for mild pain.     Marland Kitchen ALPRAZolam (XANAX) 0.25 MG tablet Take 1 tablet (0.25 mg total) by mouth 2 (two) times daily as needed for anxiety. 60 tablet 1  . cholecalciferol (VITAMIN D) 400 units TABS tablet Take 800 Units by mouth daily.    . clopidogrel (PLAVIX) 75 MG tablet Take 1 tablet (75 mg total) by mouth at bedtime. 90 tablet 1  .  docusate sodium (COLACE) 100 MG capsule Take 100 mg by mouth 2 (two) times daily as needed for mild constipation or moderate constipation.     Marland Kitchen HYDROmorphone (DILAUDID) 2 MG tablet Take 0.5 tablets (1 mg total) by mouth every 4 (four) hours as needed for moderate pain or severe pain. 20 tablet 0  . lactulose (CHRONULAC) 10 GM/15ML solution Take 45 mLs (30 g total) by mouth 2 (two) times daily as needed for mild constipation or severe constipation. 1892 mL 0  . levofloxacin (LEVAQUIN) 500 MG tablet Take 1 tablet (500 mg total) by mouth daily for 10 days. 10 tablet 0  . megestrol (MEGACE ORAL) 40 MG/ML suspension Take 5 mLs (200 mg total) by mouth daily as needed (for appetite). 150 mL 1  . methocarbamol (ROBAXIN) 500 MG tablet Take 1 tablet (500 mg total) by mouth every 8 (eight) hours as needed for muscle spasms. (Patient not taking: Reported on 04/01/2018) 60 tablet 0  . morphine (MS CONTIN) 15 MG 12 hr tablet Take 1 tablet (15 mg total) by mouth every 12 (twelve) hours. 30 tablet 0  . ondansetron (ZOFRAN) 4 MG tablet Take 1 tablet (4 mg total) by mouth every 6 (six) hours as needed for nausea. 20 tablet 0  . polyethylene glycol (MIRALAX / GLYCOLAX) packet Take 17 g by mouth daily. (Patient taking differently: Take 17 g by mouth daily as needed for mild constipation. ) 30 each 0  . potassium chloride SA (K-DUR,KLOR-CON) 20 MEQ tablet Take 1 tablet (20 mEq total) by mouth daily. 90 tablet 1  . protein supplement shake (PREMIER PROTEIN) LIQD Take 325 mLs (11 oz total) by mouth 2 (two) times daily between meals. 60 Can 0   No current facility-administered medications for this visit.     Review of Systems  Constitutional: Positive for malaise/fatigue and weight loss (poor appetite due to recent diarrheal illness; weight down 2 pounds). Negative for diaphoresis and fever.  HENT: Negative.   Eyes: Negative.   Respiratory: Positive for shortness of breath (exertional). Negative for cough, hemoptysis and  sputum production.   Cardiovascular: Negative for chest pain, palpitations, orthopnea, leg swelling and PND.       Implanted pacemaker - not MRI compatible  Gastrointestinal: Positive for diarrhea (resolving). Negative for abdominal pain, blood in stool, constipation, melena, nausea and vomiting.  Genitourinary: Negative for dysuria, frequency, hematuria and urgency.       Urinary dribbling  Musculoskeletal: Positive for back pain, joint pain and neck pain. Negative for falls and myalgias.  Skin: Negative for itching and rash.  Neurological: Positive for dizziness (with position changes). Negative for tremors, weakness and headaches.  Endo/Heme/Allergies: Does not bruise/bleed easily.  Psychiatric/Behavioral: Negative for depression, memory loss and suicidal ideas. The patient is not nervous/anxious and does not have insomnia.   All other systems reviewed and are negative.  Physical Exam: There were no vitals taken for this visit. GENERAL:  Thin gentleman sitting comfortably in the exam room in no acute distress. MENTAL STATUS:  Alert and oriented to person, place and time. HEAD:  Thin gray hair.  Normocephalic, atraumatic, face symmetric, no Cushingoid features. EYES:  Blue eyes.  Pupils equal round and reactive to light and accomodation.  No conjunctivitis or scleral icterus. ENT:  Oropharynx clear without lesion.  Tongue normal. Upper dentures.  No lower teeth.  Mucous membranes moist.  RESPIRATORY:  Clear to auscultation without rales, wheezes or rhonchi. CARDIOVASCULAR:  Regular rate and rhythm without murmur, rub or gallop. CHEST WALL:  Left sided pacemaker. ABDOMEN:  Soft, non-tender, with active bowel sounds, and no hepatosplenomegaly.  No masses. SKIN:  Nicotine stained nails.  No rashes, ulcers or lesions. EXTREMITIES: No edema, no skin discoloration or tenderness.  No palpable cords. LYMPH NODES: No palpable cervical, supraclavicular, axillary or inguinal adenopathy   NEUROLOGICAL: Unremarkable. PSYCH:  Appropriate.   GENERAL:  Well developed, well nourished, gentleman sitting comfortably in the exam room in no acute distress. MENTAL STATUS:  Alert and oriented to person, place and time. HEAD:  *** hair.  Normocephalic, atraumatic, face symmetric, no Cushingoid features. EYES:  *** eyes.  Pupils equal round and reactive to light and accomodation.  No conjunctivitis or scleral icterus. ENT:  Oropharynx clear without lesion.  Tongue normal. Mucous membranes moist.  RESPIRATORY:  Clear to auscultation without rales, wheezes or rhonchi. CARDIOVASCULAR:  Regular rate and rhythm without murmur, rub or gallop. ABDOMEN:  Soft, non-tender, with active bowel sounds, and no hepatosplenomegaly.  No masses. SKIN:  No rashes, ulcers or lesions. EXTREMITIES: No edema, no skin discoloration or tenderness.  No palpable cords. LYMPH NODES: No palpable cervical, supraclavicular, axillary or inguinal adenopathy  NEUROLOGICAL: Unremarkable. PSYCH:  Appropriate.    No visits with results within 3 Day(s) from this visit.  Latest known visit with results is:  Admission on 04/01/2018, Discharged on 04/03/2018  Component Date Value Ref Range Status  . Lipase 04/01/2018 17  11 - 51 U/L Final   Performed at [facility], [address] [place], [place] [address]  . Sodium 04/01/2018 126* 135 - 145 mmol/L Final  . Potassium 04/01/2018 4.0  3.5 - 5.1 mmol/L Final  . Chloride 04/01/2018 90* 101 - 111 mmol/L Final  . CO2 04/01/2018 22  22 - 32 mmol/L Final  . Glucose, Bld 04/01/2018 96  65 - 99 mg/dL Final  . BUN 04/01/2018 12  6 - 20 mg/dL Final  . Creatinine, Ser 04/01/2018 0.65  0.61 - 1.24 mg/dL Final  . Calcium 04/01/2018 8.3* 8.9 - 10.3 mg/dL Final  . Total Protein 04/01/2018 7.1  6.5 - 8.1 g/dL Final  . Albumin 04/01/2018 2.6* 3.5 - 5.0 g/dL Final  . AST 04/01/2018 33  15 - 41 U/L Final  . ALT 04/01/2018 15* 17 - 63 U/L Final  . Alkaline Phosphatase  04/01/2018 106  38 - 126 U/L Final  . Total Bilirubin 04/01/2018 0.8  0.3 - 1.2 mg/dL Final  . GFR calc non Af Amer 04/01/2018 >60  >60 mL/min Final  . GFR calc Af Amer 04/01/2018 >60  >60 mL/min Final   Comment: (NOTE) The eGFR has been calculated using the CKD EPI equation. This calculation has not been validated in all clinical situations. eGFR's persistently <60 mL/min signify possible Chronic Kidney Disease.   Georgiann Hahn gap 04/01/2018 14  5 - 15 Final   Performed at [facility], [address] [place], [place] [address]  .  WBC 04/01/2018 22.8* 3.8 - 10.6 K/uL Final  . RBC 04/01/2018 4.16* 4.40 - 5.90 MIL/uL Final  . Hemoglobin 04/01/2018 12.9* 13.0 - 18.0 g/dL Final  . HCT 04/01/2018 38.9* 40.0 - 52.0 % Final  . MCV 04/01/2018 93.6  80.0 - 100.0 fL Final  . MCH 04/01/2018 30.9  26.0 - 34.0 pg Final  . MCHC 04/01/2018 33.1  32.0 - 36.0 g/dL Final  . RDW 04/01/2018 20.2* 11.5 - 14.5 % Final  . Platelets 04/01/2018 227  150 - 440 K/uL Final   Performed at [facility], [address] [place], [place] [address]  . Lactic Acid, Venous 04/01/2018 2.4* 0.5 - 1.9 mmol/L Final   Comment: CRITICAL RESULT CALLED TO, READ BACK BY AND VERIFIED WITH MEGAN JONES 04/01/18 AT 12[address] [facility] Performed at [facility], [address], [place], [place] [address]   . Lactic Acid, Venous 04/01/2018 1.8  0.5 - 1.9 mmol/L Final   Performed at [facility], [address] [place], [place] [address]  . Specimen Description 04/01/2018 BLOOD R F A   Final  . Special Requests 04/01/2018 BOTTLES DRAWN AEROBIC AND ANAEROBIC Blood Culture adequate volume   Final  . Culture 04/01/2018    Final                   Value:NO GROWTH 5 DAYS Performed at [facility], [address], [place], [place] [address]   . Report Status 04/01/2018 Apr 16, 2018 FINAL   Final  . Specimen Description 04/01/2018 BLOOD R AC   Final  . Special Requests 04/01/2018 BOTTLES DRAWN AEROBIC AND  ANAEROBIC Blood Culture adequate volume   Final  . Culture 04/01/2018    Final                   Value:NO GROWTH 5 DAYS Performed at [facility], [address] [place], [place] [address]   . Report Status 04/01/2018 Apr 16, 2018 FINAL   Final  . Color, Urine 04/01/2018 AMBER* YELLOW Final   BIOCHEMICALS MAY BE AFFECTED BY COLOR  . APPearance 04/01/2018 HAZY* CLEAR Final  . Specific Gravity, Urine 04/01/2018 >1.046* 1.005 - 1.030 Final  . pH 04/01/2018 5.0  5.0 - 8.0 Final  . Glucose, UA 04/01/2018 NEGATIVE  NEGATIVE mg/dL Final  . Hgb urine dipstick 04/01/2018 MODERATE* NEGATIVE Final  . Bilirubin Urine 04/01/2018 NEGATIVE  NEGATIVE Final  . Ketones, ur 04/01/2018 20* NEGATIVE mg/dL Final  . Protein, ur 04/01/2018 30* NEGATIVE mg/dL Final  . Nitrite 04/01/2018 NEGATIVE  NEGATIVE Final  . Leukocytes, UA 04/01/2018 MODERATE* NEGATIVE Final  . RBC / HPF 04/01/2018 21-50  0 - 5 RBC/hpf Final  . WBC, UA 04/01/2018 >50* 0 - 5 WBC/hpf Final  . Bacteria, UA 04/01/2018 NONE SEEN  NONE SEEN Final  . Squamous Epithelial / LPF 04/01/2018 0-5  0 - 5 Final  . Mucus 04/01/2018 PRESENT   Final  . Non Squamous Epithelial 04/01/2018 0-5* NONE SEEN Final   Performed at [facility], [address] [place], [place] [address]  . Sodium 04/02/2018 124* 135 - 145 mmol/L Final  . Potassium 04/02/2018 3.3* 3.5 - 5.1 mmol/L Final  . Chloride 04/02/2018 94* 101 - 111 mmol/L Final  . CO2 04/02/2018 24  22 - 32 mmol/L Final  . Glucose, Bld 04/02/2018 92  65 - 99 mg/dL Final  . BUN 04/02/2018 10  6 - 20 mg/dL Final  . Creatinine, Ser 04/02/2018 0.51* 0.61 - 1.24  mg/dL Final  . Calcium 04/02/2018 6.9* 8.9 - 10.3 mg/dL Final  . GFR calc non Af Amer 04/02/2018 >60  >60 mL/min Final  . GFR calc Af Amer 04/02/2018 >60  >60 mL/min Final   Comment: (NOTE) The eGFR has been calculated using the CKD EPI equation. This calculation has not been validated in all clinical situations. eGFR's  persistently <60 mL/min signify possible Chronic Kidney Disease.   Georgiann Hahn gap 04/02/2018 6  5 - 15 Final   Performed at [facility], [address] [place], [place] [address]  . WBC 04/02/2018 16.6* 3.8 - 10.6 K/uL Final  . RBC 04/02/2018 2.80* 4.40 - 5.90 MIL/uL Final  . Hemoglobin 04/02/2018 8.6* 13.0 - 18.0 g/dL Final   RESULT REPEATED AND VERIFIED  . HCT 04/02/2018 25.9* 40.0 - 52.0 % Final  . MCV 04/02/2018 92.3  80.0 - 100.0 fL Final  . MCH 04/02/2018 30.6  26.0 - 34.0 pg Final  . MCHC 04/02/2018 33.2  32.0 - 36.0 g/dL Final  . RDW 04/02/2018 20.2* 11.5 - 14.5 % Final  . Platelets 04/02/2018 197  150 - 440 K/uL Final   Performed at [facility], [address], [place], [place] [address]  . Sodium 04/03/2018 127* 135 - 145 mmol/L Final  . Potassium 04/03/2018 3.4* 3.5 - 5.1 mmol/L Final  . Chloride 04/03/2018 99* 101 - 111 mmol/L Final  . CO2 04/03/2018 21* 22 - 32 mmol/L Final  . Glucose, Bld 04/03/2018 87  65 - 99 mg/dL Final  . BUN 04/03/2018 8  6 - 20 mg/dL Final  . Creatinine, Ser 04/03/2018 0.44* 0.61 - 1.24 mg/dL Final  . Calcium 04/03/2018 6.7* 8.9 - 10.3 mg/dL Final  . GFR calc non Af Amer 04/03/2018 >60  >60 mL/min Final  . GFR calc Af Amer 04/03/2018 >60  >60 mL/min Final   Comment: (NOTE) The eGFR has been calculated using the CKD EPI equation. This calculation has not been validated in all clinical situations. eGFR's persistently <60 mL/min signify possible Chronic Kidney Disease.   Georgiann Hahn gap 04/03/2018 7  5 - 15 Final   Performed at [facility], [address] [place], [place] [address]  . WBC 04/03/2018 17.1* 3.8 - 10.6 K/uL Final  . RBC 04/03/2018 2.64* 4.40 - 5.90 MIL/uL Final  . Hemoglobin 04/03/2018 8.1* 13.0 - 18.0 g/dL Final  . HCT 04/03/2018 24.2* 40.0 - 52.0 % Final  . MCV 04/03/2018 91.5  80.0 - 100.0 fL Final  . MCH 04/03/2018 30.7  26.0 - 34.0 pg Final  . MCHC 04/03/2018 33.5  32.0 - 36.0 g/dL Final  . RDW  04/03/2018 20.0* 11.5 - 14.5 % Final  . Platelets 04/03/2018 205  150 - 440 K/uL Final   Performed at [facility], [address] [place], [place] [address]    Assessment:  Patrick Hooper is a 72 y.o. male with metastatic disease.  He has a recent history of muscle invasive bladder cancer and a distant history of lung cancer.  He has a history of stage IB lung cancer status post right upper lobe lobectomy in 09/2015.  Pathology revealed a T2aN0M0 adenocarcinoma.  Chest CT on 05/25/2017 revealed postoperative changes from a right upper lobe lobectomy without evidence of recurrence. There was new diffuse punctate sclerotic osseous lesions throughout the chest concerning for metastatic disease.  PET scan on 06/08/2017 at the [facility] revealed right upper lobe lobectomy without evidence of recurrence. There was a  new 3.2 cm mass along the right inferior bladder wall, anterior to the ureteral vesicular junction.  There were multiple new focal sclerotic and some probable mixed lytic and sclerotic osseous lesions in the axial skeleton many of which demonstrated increased FDG avidity. Differential diagnoses included include metastatic lung cancer, bladder cancer or prostate cancer. There was increased FDG uptake in the left medial thigh musculature of uncertain etiology.  PET scan on 09/09/2017 revealed several hypermetabolic mixed lytic and sclerotic osseous metastases throughout the axial skeleton.  There was suspected intramuscular soft tissue metastases in the right quadratus lumborum and posterior left lumbar paraspinal musculature.  There was mildly hypermetabolic retroperitoneal lymphadenopathy, probably representing nodal metastases.  There was diffuse irregular bladder wall thickening, asymmetrically prominent in the right bladder wall, suspicious for residual/recurrent bladder neoplasm.   There was nonspecific small ground-glass nodular opacity in the left upper lung lobe with low  level metabolism.  There was no evidence of local tumor recurrence in the right lung status post right upper lobectomy   He underwent transurethral resection of bladder tumor (TURBT) with bilateral retrograde pyelogram on 08/12/2017.  He had a 3 cm bladder tumor on the right lateral wall with some sessile and papillary features.  Retrograde pyelograms were normal without filling defect.  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  He began monthly Xgeva on 09/16/2017 (last 02/16/2018).  He is s/p 6 cycles of carboplatin and gemcitabine (11/03/2017 - 02/16/2018).  Cycle #1 was complicated by orthostatic hypotension and fatigue requiring IVF and delay in day 8 gemcitabine (80% dose).  Cycle #2 was complicated by fecal impaction.  He did not receive gemcitabine of day 8 (WBC 1800 with an ANC of 1000).  Nadir platelet count was 23,000 with cycle #3, 19,000 with cycle #4, 31,000 with cycle #5, and 19,000 with cycle #6.   PET scan on 01/23/2018 revealed a mixed but generally positive response to therapy, most typified by decreased hypermetabolism throughout the numerous musculoskeletal metastases. While there were several new and enlarged sclerotic osseous lesions, most of these demonstrated no hypermetabolism, presumably treated metastatic lesions.  There was nonenlarged but mildly hypermetabolic right level 5B lymph node in the lower right cervical region. There were several other nonenlarged non hypermetabolic lymph nodes  adjacent to this area., uncertain etiology and significance.  Bone scan on 02/28/2018 revealed multifocal areas of increased uptake involving the thoracic and lumbar spine, bony pelvis, right ribs, and right scapula.   Areas of uptake generally corresponded to the FDG avid lesions on the PET scan.  He has had issues with hypotension despite a history of hypertension.  He is off blood pressure medications.  Cortisol level was normal on  10/27/2017.  He has a normocytic anemia.  Work-up on 11/24/2017 revealed a normal ferritin, iron studies, folate.  B12 was 243 (low) and methylmalonic acid 151 (normal).  B12 was 440 on 02/16/2018.  He has a history of peripheral vascular disease s/p fem-fem bypass in 2016. He is on Plavix.  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  Colonoscopy was normal in 2011.  Symptomatically, he had a recent episode of diarrhea that was likely of food borne etiology.  Appetite was poor due to acute diarrheal illness. He lost 2 pounds.  He takes Tylenol for pain, however he has been having more pain.  Lortab causes vivid dreams.  Exam is stable. WBC is 9200 ([place] 6400).  Hemoglobin is 9.9, hematocrit 28.1, and platelets  172,000.  Sodium is 129 (low). Potassium is 3.7.   Magnesium is 1.8.  Plan: 1.   Labs today:  CBC with diff, CMP, Mg. 2.   Discuss interval bone scan- lesions correspond with PET scan.  No new lesions. 3.   Discuss recovery of counts.  Discuss patient's thoughts about next cycle of chemotherapy.  Patient would like to postpone until 04/04/2018 (upcoming trip). 4.   Discuss pain control. Experiences significant pain at bedtime. Given a current oncological diagnosis, this patient has the potential to experience significant cancer related pain. Pain 4/10 in clinic today. Benefits versus risks associated with continued therapy considered. Will continue pain management with opioids as previously prescribed. Patient educated that medications should not be bitten, chewed, or crushed. Additionally, safety precautions reviewed. Patient verbalized understanding that medications should not be sold or shared, taken with alcohol, or used while driving.   a. Refill: MS Contin 15 mg BID (Disp #30).  5.   Discuss electrolytes.  Patient has hyponatremia.  Encourage fluids with electrolytes and added salt.  Patient states he won't drink gatorade. Continue current oral potassium. 6.    Continue calcium and vitamin D. 7.   B12 injection today, then monthly.  8.   Continue Xgeva monthly (due 03/16/2018). 9.   RTC on 04/04/2018 for MD assessment, labs (CBC with diff, CMP, Mg), B12 injection, and day 1 of cycle #7 carboplatin + gemcitabine. 10.   RTC on 04/11/2018 for MD assessment, labs (CBC with diff, CMP, Mg), and day 8 of cycle #7 gemcitabine.  Lequita Asal, MD  04/11/2018, 4:39 AM   I saw and evaluated the patient, participating in the key portions of the service and reviewing pertinent diagnostic studies and records.  I reviewed the nurse practitioner's note and agree with the findings and the plan.  The assessment and plan were discussed with the patient.  Multiple questions were asked by the patient and answered.   Nolon Stalls, MD 04/11/2018,4:39 AM

## 2018-04-11 NOTE — Telephone Encounter (Signed)
-----   Message from Wilburn Cornelia sent at 04/11/2018  8:27 AM EDT ----- Regarding: death Contact: [phone] Pt SGO(Pat Phillip Heal LVM this AM that she received MyChart appt notice and that he had passed away-I have not marked it as DECEASED yet.

## 2018-05-01 DEATH — deceased

## 2019-05-02 DEATH — deceased

## 2019-05-18 IMAGING — CT CT ABD-PELV W/ CM
3 of 11 series · 11 of 46 positions shown, 17 images · IV contrast (APPLIED)
Comparison: PET-CT 09/09/2017

CLINICAL DATA: Chest pain. Abdominal pain and distention. History
of prior lung cancer and bladder cancer.

EXAM:
CT ANGIOGRAPHY CHEST
CT ABDOMEN AND PELVIS WITH CONTRAST
TECHNIQUE: Multidetector CT imaging of the chest was performed using the
standard protocol during bolus administration of intravenous
contrast. Multiplanar CT image reconstructions and MIPs were
obtained to evaluate the vascular anatomy. Multidetector CT imaging
of the abdomen and pelvis was performed using the standard protocol
during bolus administration of intravenous contrast.
CONTRAST:  75mL [id number] IOPAMIDOL (GEW340-EME) INJECTION 76%

[Series 5: thins · axial · 0.69mm/px · z∈[-318,-128]mm · 6 of 304 slices shown]
[im 19/304  soft-tissue]
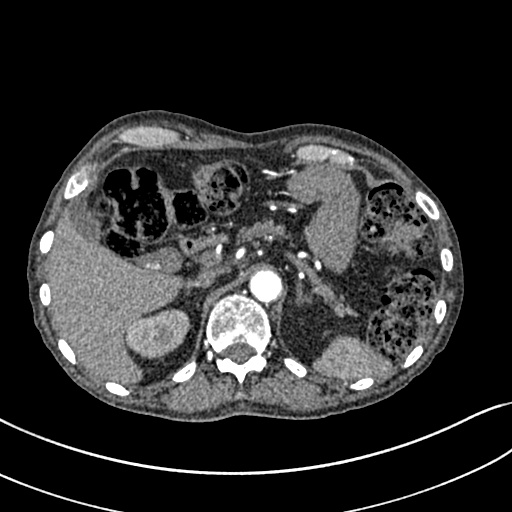
[im 57/304  soft-tissue]
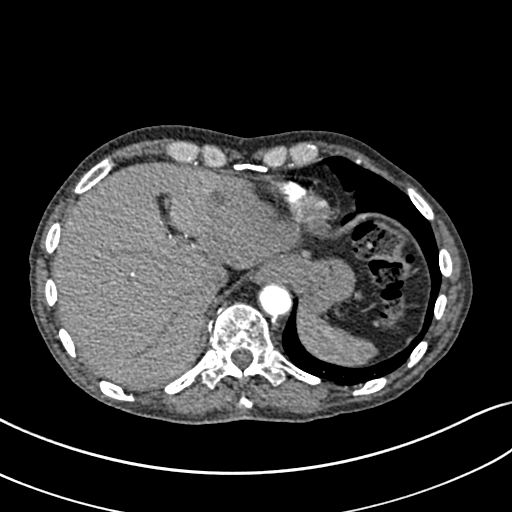
[im 95/304  soft-tissue]
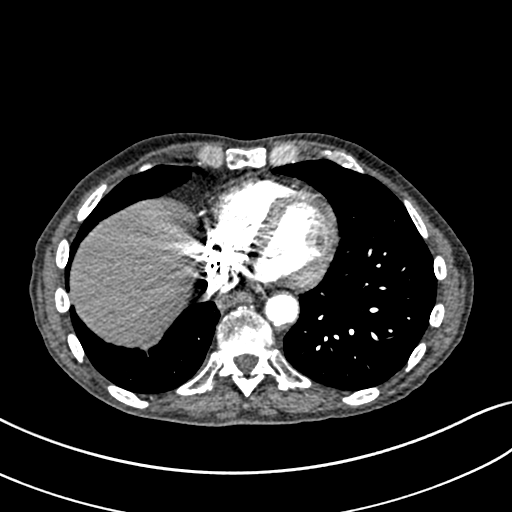
[im 133/304  soft-tissue]
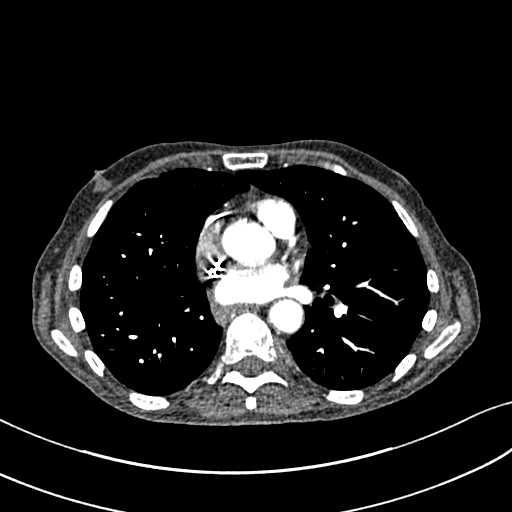
[im 171/304  soft-tissue]
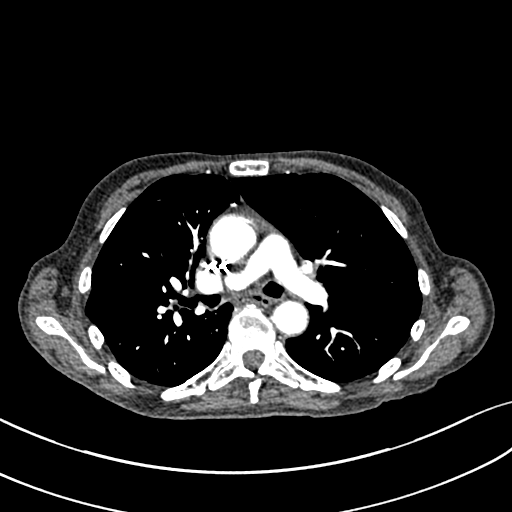
[im 209/304  soft-tissue]
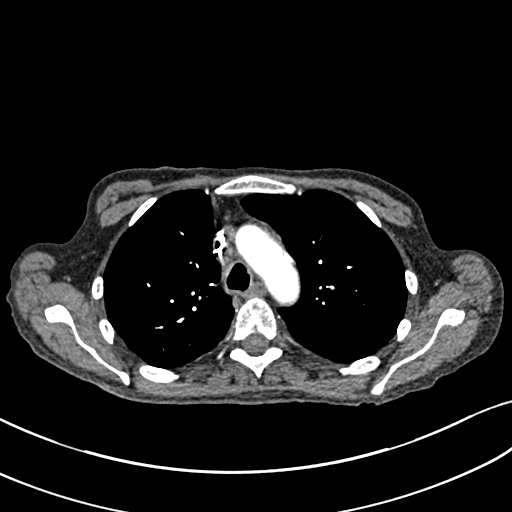

[Series 7: coronal mpr · coronal · 0.61mm/px · 1 of 103 slices shown, 2 images]
[im 52/103  soft-tissue]
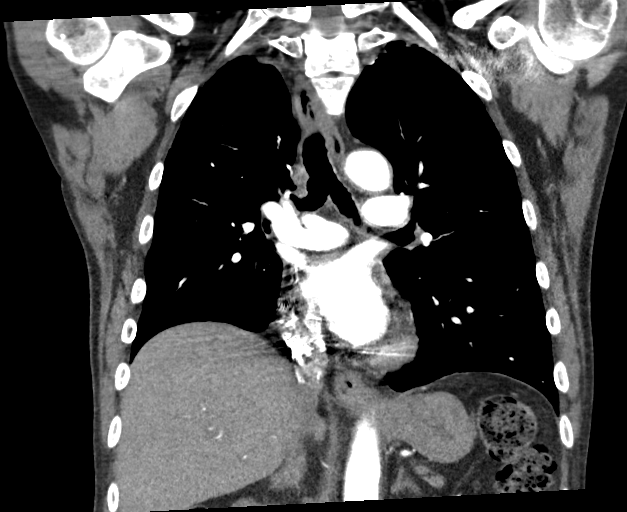
[im 52/103  bone]
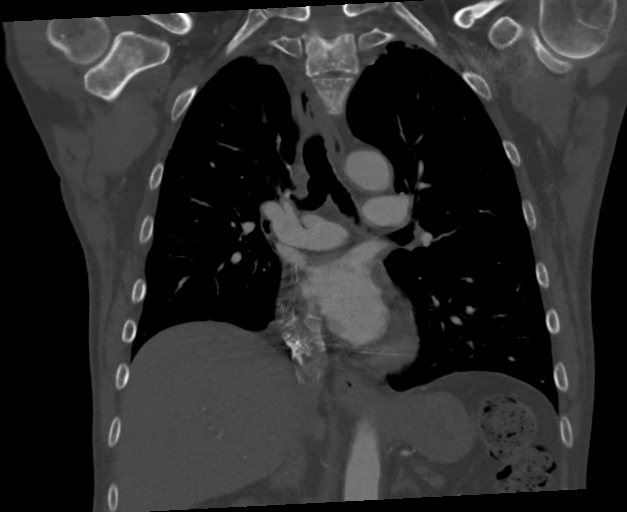

[Series 11: axial st · axial · 0.64mm/px · z∈[-596,-301]mm · 4 of 99 slices shown, 9 images]
[im 20/99  soft-tissue]
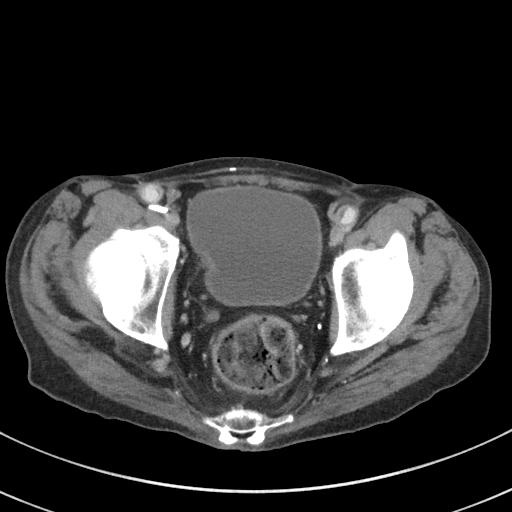
[im 20/99  lung]
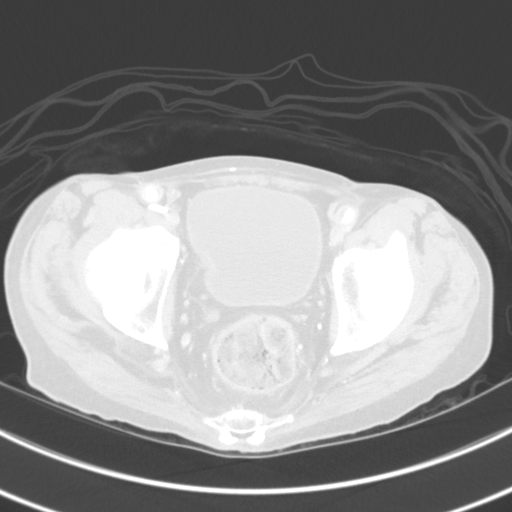
[im 20/99  bone]
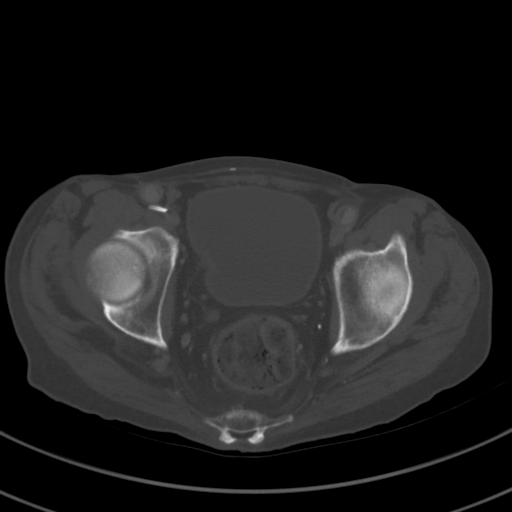
[im 40/99  soft-tissue]
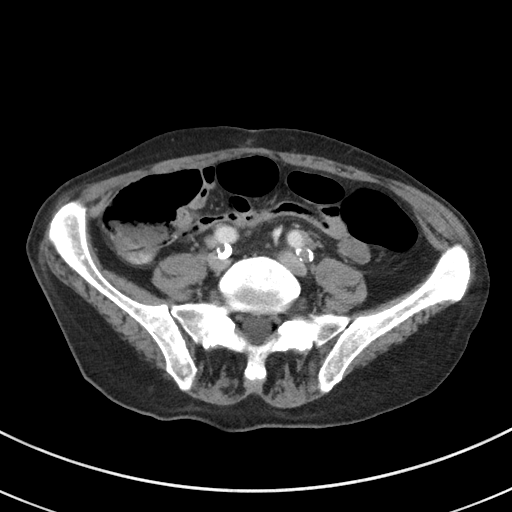
[im 40/99  lung]
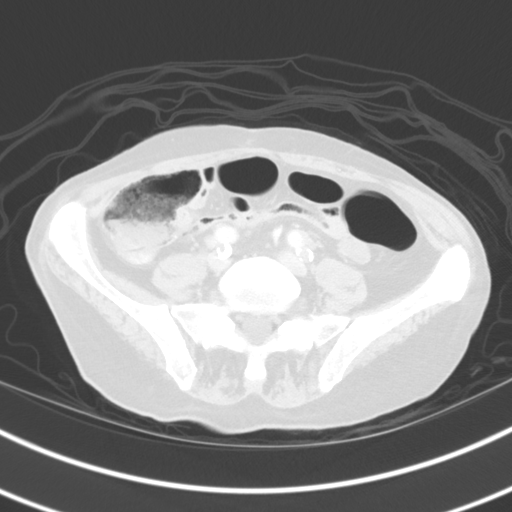
[im 59/99  soft-tissue]
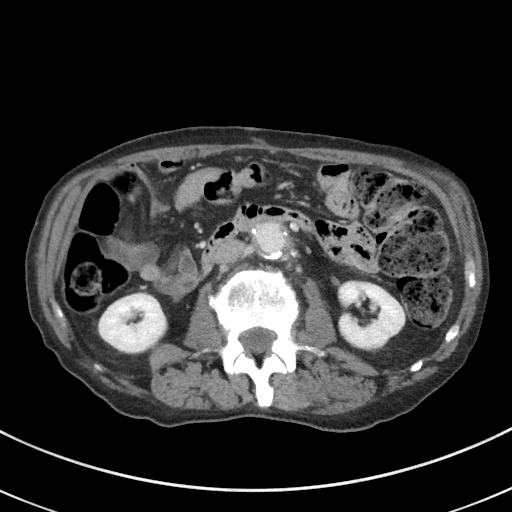
[im 59/99  lung]
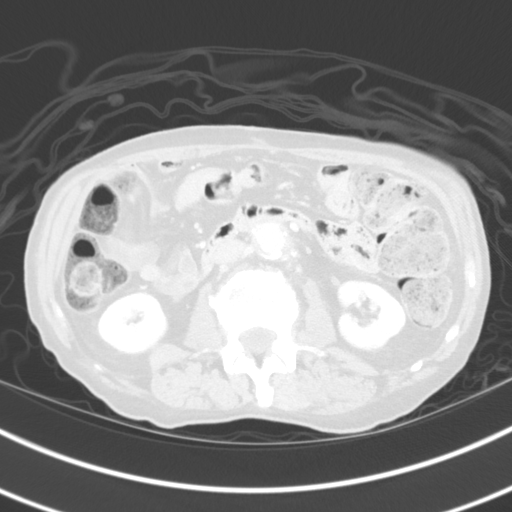
[im 79/99  soft-tissue]
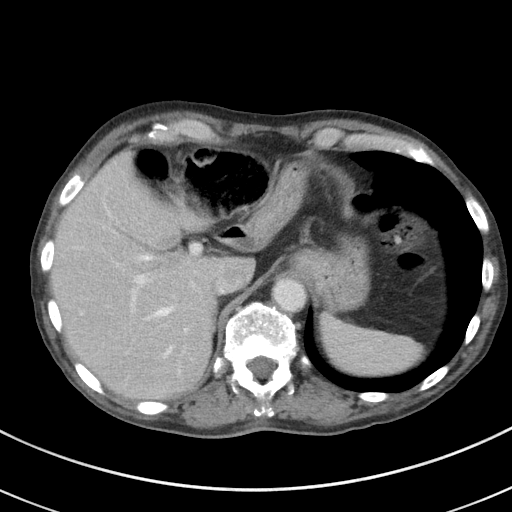
[im 79/99  lung]
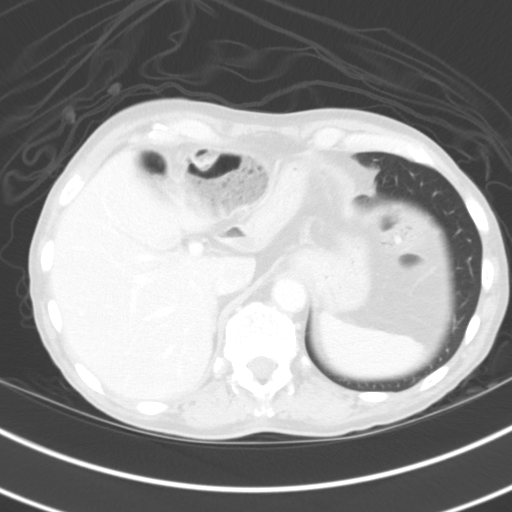

[11 of 46 positions shown; findings below may reference images not displayed]

FINDINGS: CTA CHEST FINDINGS

Cardiovascular: The heart is normal in size. No pericardial
effusion. The aorta is normal in caliber. Stable moderate
atherosclerotic calcifications but no aneurysm or dissection. Stable
coronary artery calcifications.

The pulmonary arterial tree is well opacified. No filling defects to
suggest pulmonary embolism.

Mediastinum/Nodes: Small scattered mediastinal and hilar lymph nodes
but no mass or overt adenopathy. The esophagus is grossly normal.

Lungs/Pleura: Stable surgical changes from a right upper lobe
lobectomy. Stable emphysematous changes. No worrisome pulmonary
lesions. No acute pulmonary findings.

Musculoskeletal: Right-sided Port-A-Cath in good position. Permanent
left-sided pacemaker noted.

Diffuse sclerotic osseous metastatic disease, progressive when
compared to prior PET-CT.

Review of the MIP images confirms the above findings.

CT ABDOMEN and PELVIS FINDINGS

Hepatobiliary: No focal hepatic lesions or intrahepatic biliary
dilatation. The gallbladder is normal. No common bile duct
dilatation.

Pancreas: No mass, inflammation or ductal dilatation. Moderate
atrophy. Calcifications in the uncinate process region likely
related to prior inflammatory disease.

Spleen: Normal size.  No focal lesions.

Adrenals/Urinary Tract: The adrenal glands are normal. No worrisome
renal lesions. The delayed images do not demonstrate any significant
collecting system abnormalities.

The bladder demonstrates moderate-sized area of right-sided
asymmetric bladder wall thickening with enhancement consistent with
residual or recurrent bladder cancer. There is also median lobe
hypertrophy of the prostate gland impressing on the base the
bladder.

Stomach/Bowel: The stomach, duodenum, small bowel and colon are
grossly normal. No acute inflammatory changes, mass lesions or
obstructive findings. Large amount of stool throughout the colon and
down into the rectum suggesting fecal impaction and constipation.

Vascular/Lymphatic: Surgical changes from aortoiliac bypass
grafting. Dense atherosclerotic calcifications at the major branch
vessel ostia. No aneurysm or dissection. The graft limbs are patent.

No mesenteric or retroperitoneal mass or adenopathy.

Reproductive: Prostate gland and seminal vesicles are unremarkable
except for median lobe hypertrophy.

Other: No pelvic mass or adenopathy. No free pelvic fluid
collections. No inguinal mass or adenopathy.

Musculoskeletal: Diffuse sclerotic osseous metastatic disease,
progressive when compared to prior study.

Review of the MIP images confirms the above findings.
IMPRESSION: 1. No CT findings for pulmonary embolism.
2. Surgical changes from a right upper lobe lobectomy but no
findings for recurrent tumor or pulmonary metastatic disease.
3. Residual or recurrent bladder cancer at the right bladder base.
4. Progressive sclerotic osseous metastatic disease.
5. Surgical changes related to aorto iliac bypass grafting without
complicating features.
6. Large amount of stool throughout the colon and down into the
rectum suggesting constipation and fecal impaction.

## 2019-06-30 IMAGING — DX DG ABDOMEN 2V
2 series · 2 of 2 positions shown · non-contrast
Comparison: CTA chest, CT abdomen and Pelvis 11/28/2017. Chest and
abdomen radiographs 10/12/2017.

CLINICAL DATA: 71-year-old male with constipation. Being treated
for bladder cancer at this time.

EXAM:
ABDOMEN - 2 VIEW

[abdomen erect]
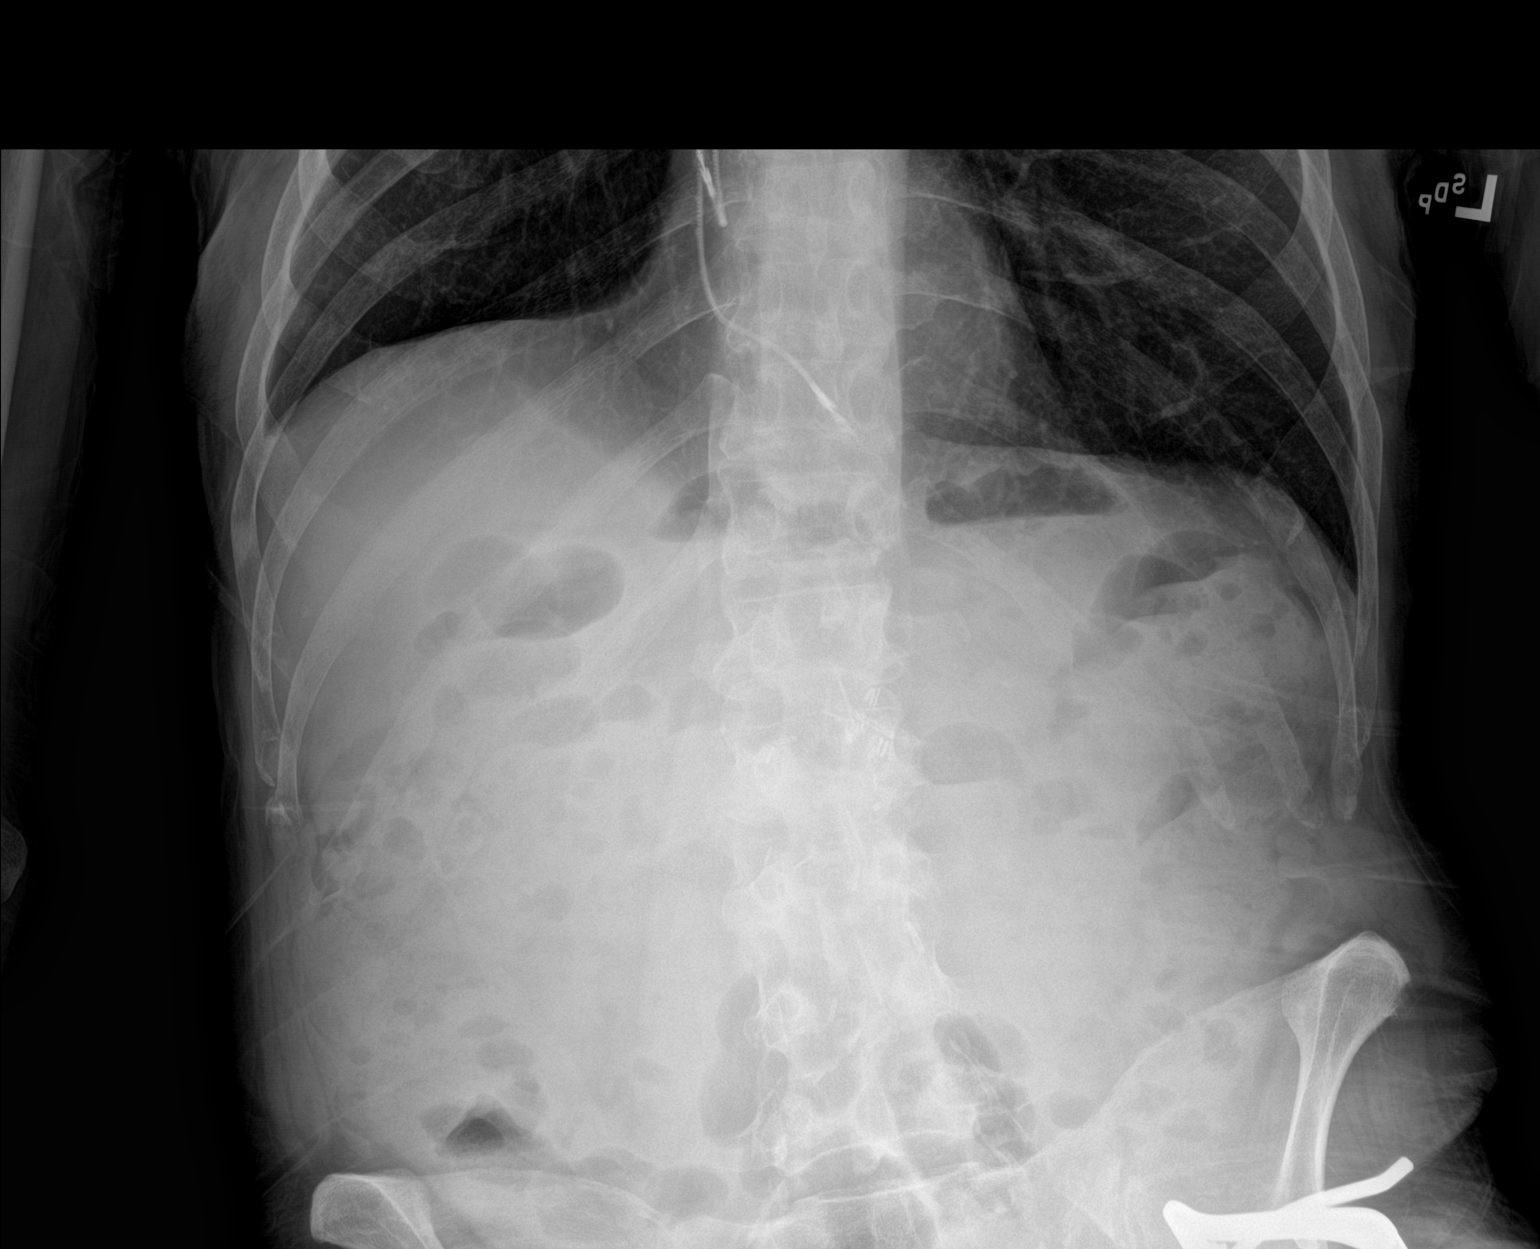

[abdomen supine]
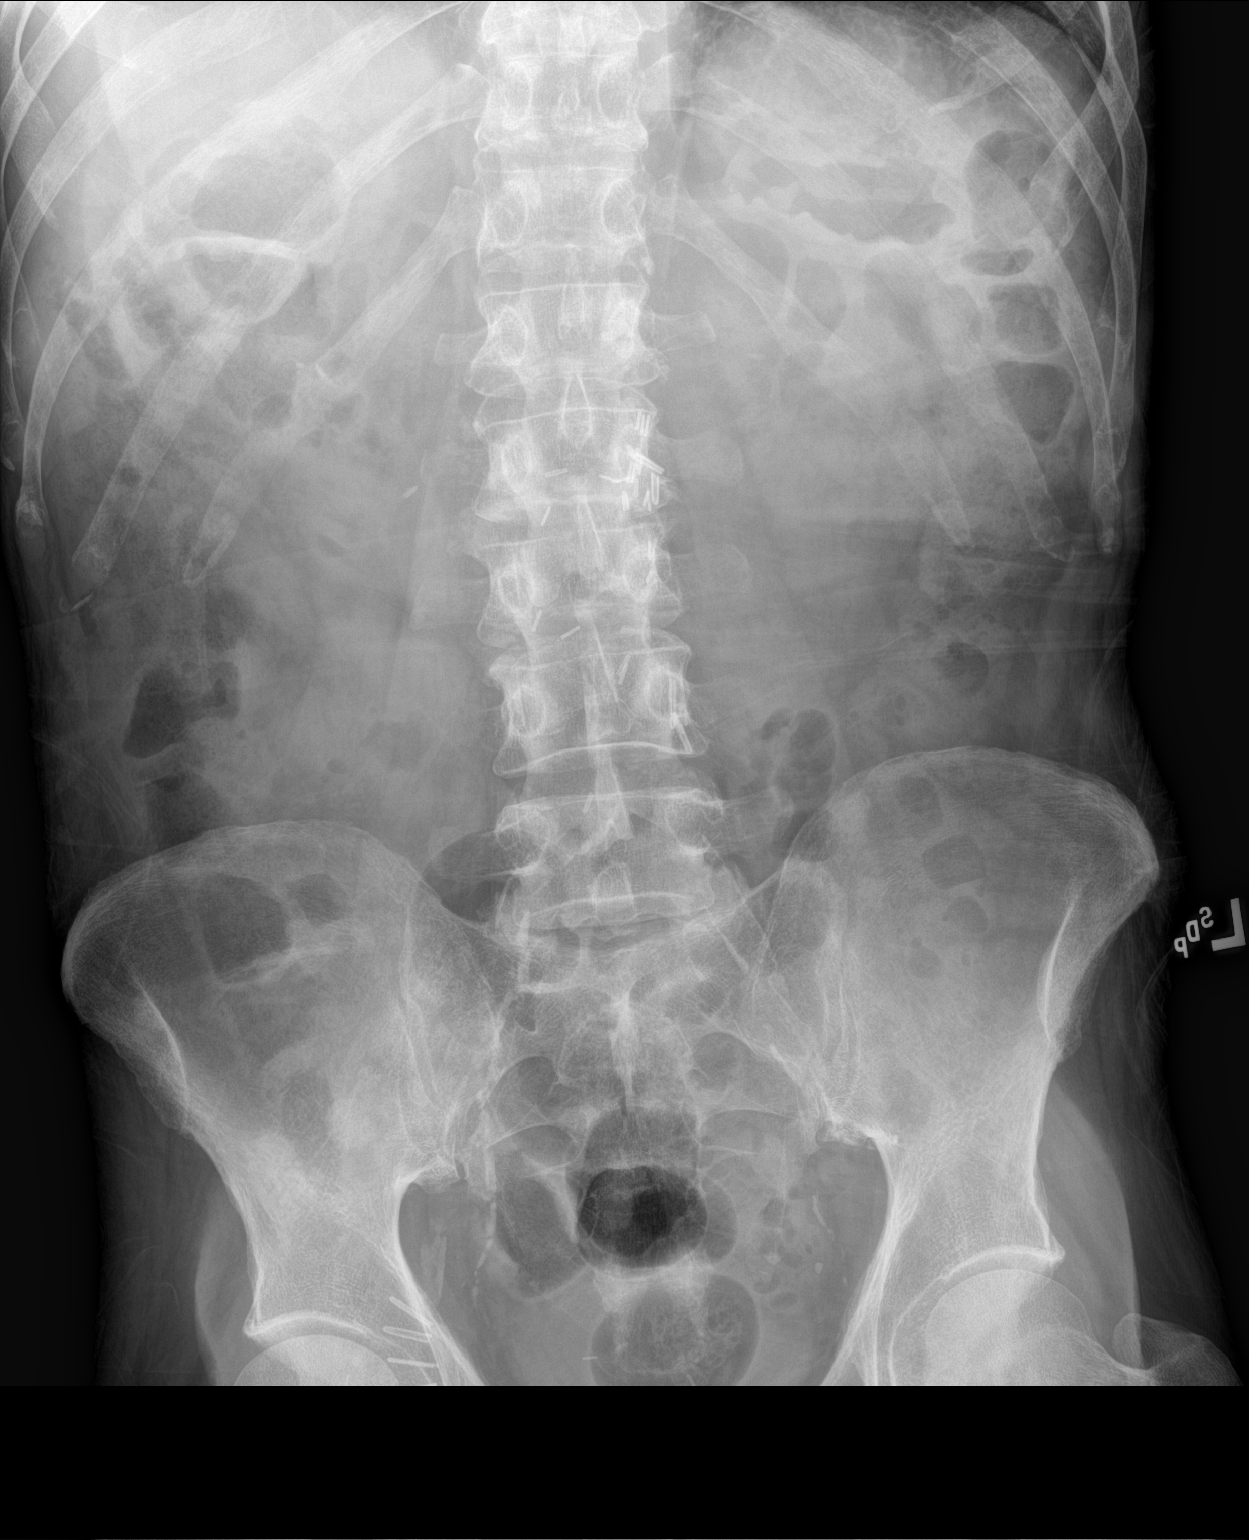

[2 of 2 positions shown; findings below may reference images not displayed]

FINDINGS: Upright and supine views of the abdomen and pelvis. Lung bases
appear stable and negative. No pneumoperitoneum. Non obstructed
bowel gas pattern. Mild volume of retained stool in the ascending
and descending colon. Chronic abdominal and pelvic surgical clips.
Chronic small pelvic phleboliths. Iliac artery calcified
atherosclerosis re-demonstrated. No acute osseous abnormality
identified.
IMPRESSION: 1.  Normal bowel gas pattern, no free air.
2. Relatively mild volume of retained stool in the colon.
3. Chronic calcified vascular disease.
